# Patient Record
Sex: Female | Born: 1970 | Hispanic: No | Marital: Single | Smoking: Never smoker
Health system: Southern US, Community
[De-identification: ages and names within clinical notes are randomized; demographics above are authoritative.]

## PROBLEM LIST (undated history)

## (undated) DIAGNOSIS — G43909 Migraine, unspecified, not intractable, without status migrainosus: Secondary | ICD-10-CM

## (undated) DIAGNOSIS — J3089 Other allergic rhinitis: Secondary | ICD-10-CM

## (undated) DIAGNOSIS — K219 Gastro-esophageal reflux disease without esophagitis: Secondary | ICD-10-CM

## (undated) DIAGNOSIS — R200 Anesthesia of skin: Secondary | ICD-10-CM

## (undated) DIAGNOSIS — R5383 Other fatigue: Secondary | ICD-10-CM

## (undated) DIAGNOSIS — R42 Dizziness and giddiness: Secondary | ICD-10-CM

## (undated) DIAGNOSIS — Z8619 Personal history of other infectious and parasitic diseases: Secondary | ICD-10-CM

## (undated) DIAGNOSIS — E739 Lactose intolerance, unspecified: Secondary | ICD-10-CM

## (undated) DIAGNOSIS — M1990 Unspecified osteoarthritis, unspecified site: Secondary | ICD-10-CM

## (undated) DIAGNOSIS — Z87442 Personal history of urinary calculi: Secondary | ICD-10-CM

## (undated) DIAGNOSIS — T4145XA Adverse effect of unspecified anesthetic, initial encounter: Secondary | ICD-10-CM

## (undated) DIAGNOSIS — R202 Paresthesia of skin: Secondary | ICD-10-CM

## (undated) DIAGNOSIS — R001 Bradycardia, unspecified: Secondary | ICD-10-CM

## (undated) DIAGNOSIS — F329 Major depressive disorder, single episode, unspecified: Secondary | ICD-10-CM

## (undated) DIAGNOSIS — M069 Rheumatoid arthritis, unspecified: Secondary | ICD-10-CM

## (undated) DIAGNOSIS — M5126 Other intervertebral disc displacement, lumbar region: Secondary | ICD-10-CM

## (undated) DIAGNOSIS — F32A Depression, unspecified: Secondary | ICD-10-CM

## (undated) DIAGNOSIS — K529 Noninfective gastroenteritis and colitis, unspecified: Secondary | ICD-10-CM

## (undated) DIAGNOSIS — J45909 Unspecified asthma, uncomplicated: Secondary | ICD-10-CM

## (undated) DIAGNOSIS — N939 Abnormal uterine and vaginal bleeding, unspecified: Secondary | ICD-10-CM

## (undated) DIAGNOSIS — K589 Irritable bowel syndrome without diarrhea: Secondary | ICD-10-CM

## (undated) DIAGNOSIS — K439 Ventral hernia without obstruction or gangrene: Secondary | ICD-10-CM

## (undated) DIAGNOSIS — D509 Iron deficiency anemia, unspecified: Secondary | ICD-10-CM

## (undated) DIAGNOSIS — E162 Hypoglycemia, unspecified: Secondary | ICD-10-CM

## (undated) DIAGNOSIS — K8020 Calculus of gallbladder without cholecystitis without obstruction: Secondary | ICD-10-CM

## (undated) DIAGNOSIS — M810 Age-related osteoporosis without current pathological fracture: Secondary | ICD-10-CM

## (undated) DIAGNOSIS — IMO0002 Reserved for concepts with insufficient information to code with codable children: Secondary | ICD-10-CM

## (undated) DIAGNOSIS — M329 Systemic lupus erythematosus, unspecified: Secondary | ICD-10-CM

## (undated) DIAGNOSIS — F419 Anxiety disorder, unspecified: Secondary | ICD-10-CM

## (undated) HISTORY — DX: Other intervertebral disc displacement, lumbar region: M51.26

## (undated) HISTORY — PX: TUBAL LIGATION: SHX77

## (undated) HISTORY — DX: Migraine, unspecified, not intractable, without status migrainosus: G43.909

## (undated) HISTORY — DX: Lactose intolerance, unspecified: E73.9

## (undated) HISTORY — DX: Major depressive disorder, single episode, unspecified: F32.9

## (undated) HISTORY — DX: Depression, unspecified: F32.A

## (undated) HISTORY — PX: APPENDECTOMY: SHX54

## (undated) HISTORY — DX: Unspecified asthma, uncomplicated: J45.909

## (undated) HISTORY — PX: UMBILICAL HERNIA REPAIR: SUR1181

## (undated) HISTORY — PX: COLONOSCOPY: SHX174

## (undated) HISTORY — DX: Age-related osteoporosis without current pathological fracture: M81.0

---

## 1992-01-13 DIAGNOSIS — T8859XA Other complications of anesthesia, initial encounter: Secondary | ICD-10-CM

## 1992-01-13 HISTORY — DX: Other complications of anesthesia, initial encounter: T88.59XA

## 2012-02-22 DIAGNOSIS — B029 Zoster without complications: Secondary | ICD-10-CM | POA: Insufficient documentation

## 2012-09-27 ENCOUNTER — Emergency Department (HOSPITAL_COMMUNITY)
Admission: EM | Admit: 2012-09-27 | Discharge: 2012-09-27 | Disposition: A | Discharge status: Home / Self Care | Payer: Medicaid Other | Attending: Emergency Medicine | Admitting: Emergency Medicine

## 2012-09-27 ENCOUNTER — Emergency Department (HOSPITAL_COMMUNITY): Payer: Medicaid Other

## 2012-09-27 DIAGNOSIS — R531 Weakness: Secondary | ICD-10-CM

## 2012-09-27 DIAGNOSIS — Z79899 Other long term (current) drug therapy: Secondary | ICD-10-CM | POA: Insufficient documentation

## 2012-09-27 DIAGNOSIS — R5381 Other malaise: Secondary | ICD-10-CM | POA: Insufficient documentation

## 2012-09-27 DIAGNOSIS — R209 Unspecified disturbances of skin sensation: Secondary | ICD-10-CM | POA: Insufficient documentation

## 2012-09-27 DIAGNOSIS — R42 Dizziness and giddiness: Secondary | ICD-10-CM | POA: Insufficient documentation

## 2012-09-27 DIAGNOSIS — Z3202 Encounter for pregnancy test, result negative: Secondary | ICD-10-CM | POA: Insufficient documentation

## 2012-09-27 LAB — COMPREHENSIVE METABOLIC PANEL
CO2: 24 mEq/L (ref 19–32)
Calcium: 9.1 mg/dL (ref 8.4–10.5)
Creatinine, Ser: 0.8 mg/dL (ref 0.50–1.10)
GFR calc Af Amer: >90 mL/min (ref >90)
GFR calc non Af Amer: 89 mL/min — ABNORMAL LOW (ref >90)
Glucose, Bld: 108 mg/dL — ABNORMAL HIGH (ref 70–99)
Sodium: 139 mEq/L (ref 135–145)
Total Protein: 7.7 g/dL (ref 6.0–8.3)

## 2012-09-27 LAB — CBC WITH DIFFERENTIAL/PLATELET
Basophils Relative: 1 % (ref 0–1)
HCT: 35.1 % — ABNORMAL LOW (ref 36.0–46.0)
Hemoglobin: 11.5 g/dL — ABNORMAL LOW (ref 12.0–15.0)
Lymphocytes Relative: 32 % (ref 12–46)
Lymphs Abs: 2.7 10*3/uL (ref 0.7–4.0)
Monocytes Absolute: 0.7 10*3/uL (ref 0.1–1.0)
Monocytes Relative: 9 % (ref 3–12)
Neutro Abs: 4.6 10*3/uL (ref 1.7–7.7)
Neutrophils Relative %: 56 % (ref 43–77)
RBC: 4.44 MIL/uL (ref 3.87–5.11)
WBC: 8.2 10*3/uL (ref 4.0–10.5)

## 2012-09-27 LAB — URINALYSIS, ROUTINE W REFLEX MICROSCOPIC
Glucose, UA: NEGATIVE mg/dL
Ketones, ur: NEGATIVE mg/dL
Protein, ur: NEGATIVE mg/dL
Urobilinogen, UA: 0.2 mg/dL (ref 0.0–1.0)

## 2012-09-27 LAB — URINE MICROSCOPIC-ADD ON

## 2012-09-27 NOTE — ED Notes (Signed)
Last seen normal 3 days ago.

## 2012-09-27 NOTE — ED Notes (Addendum)
Multiple complaints including hard time swallowing, and numbness in right arm, and right leg. Drift noted in right arm, and right drift.

## 2012-09-27 NOTE — ED Notes (Signed)
Patient C/O pain beginning on her right neck behind her ear and goes down her right leg.  C/o having some right sided numbness. States that this is new. Also C/O having some dizziness.

## 2012-09-27 NOTE — ED Provider Notes (Signed)
Complains of right-sided headache that radiates to her right neck, right shoulder and right flank onset 2 days ago. Also complains of dizziness i.e. symptoms of vertigo Patient also complains of" lumps in my neck" for several days.. Also complains of pain on swallowing for the past 3 days. No fever no other complaint on exam alert nontoxic HEENT exam no facial asymmetry neck supple positive anterior cervical lymphadenopathy, lungs clear equal breath sounds heart regular in rhythm abdomen nondistended nontender back without flank tenderness all 4 extremities all redness or tenderness neurovascularly intact neurologic gait normal Romberg normal pronator drift normal motor strength 5 over 5 overall finger-nose normal her shin normal  Doug Sou, MD 09/27/12 2251

## 2012-09-27 NOTE — ED Notes (Signed)
Pt states she has right sided flank pain that radiates to back and down groin to right leg, and up to right shoulder and neck.

## 2012-09-27 NOTE — ED Provider Notes (Signed)
History     CSN: [id number]  Arrival date & time 09/27/12  1928   First MD Initiated Contact with Patient 09/27/12 2202      Chief Complaint  Patient presents with  . Flank Pain  . Transient Ischemic Attack    (Consider location/radiation/quality/duration/timing/severity/associated sxs/prior treatment) HPI Comments: 42 y.o. female who presents to the ER with a multitude of complaints. Pt states that she has had some dizziness, some blurry vision, and some tingling in her left legs. She also complains of some right sided back pain radiating down her leg. She denies chest pain / sob. She states she has had these types of symptoms prior. She states she was evaluated in [place] for these symptoms -- she states she was thought to have MS -- and "a bunch of tests" were negative. She states she has had a recurrence of these symptoms, but the tingling in her legs is new.  She is new to this area -- states that she is in the process of establishing a pcp -- she has insurance.   Patient is a 42 y.o. female presenting with general illness. The history is provided by the patient.  Illness  The current episode started 3 to 5 days ago. The onset was gradual. The problem occurs rarely. The problem has been resolved. The problem is mild. Nothing relieves the symptoms. Nothing aggravates the symptoms. Pertinent negatives include no fever, no eye itching, no abdominal pain, no diarrhea, no nausea, no vomiting, no headaches, no stridor, no neck pain, no cough, no wheezing, no rash, no eye discharge and no eye pain.    No past medical history on file.  No past surgical history on file.  No family history on file.  History  Substance Use Topics  . Smoking status: Not on file  . Smokeless tobacco: Not on file  . Alcohol Use: Not on file    OB History   No data available      Review of Systems  Constitutional: Negative for fever, chills and fatigue.  HENT: Negative for facial swelling,  drooling, neck pain and dental problem.   Eyes: Negative for pain, discharge and itching.  Respiratory: Negative for cough, choking, wheezing and stridor.   Cardiovascular: Negative for chest pain.  Gastrointestinal: Negative for nausea, vomiting, abdominal pain and diarrhea.  Endocrine: Negative for cold intolerance and heat intolerance.  Genitourinary: Negative for vaginal discharge, difficulty urinating and vaginal pain.  Skin: Negative for pallor and rash.  Neurological: Negative for dizziness, light-headedness and headaches.       Tingling in right leg   Psychiatric/Behavioral: Negative for behavioral problems and agitation.    Allergies  Trazodone and nefazodone  Home Medications   Current Outpatient Rx  Name  Route  Sig  Dispense  Refill  . Cyanocobalamin (VITAMIN B-12 IJ)   Injection   Inject as directed.         Marland Kitchen doxylamine, Sleep, (UNISOM) 25 MG tablet   Oral   Take 50 mg by mouth at bedtime as needed for sleep.         Marland Kitchen ibuprofen (ADVIL,MOTRIN) 600 MG tablet   Oral   Take 600 mg by mouth every 6 (six) hours as needed for pain.         . naproxen (NAPROSYN) 500 MG tablet   Oral   Take 500 mg by mouth 2 (two) times daily with a meal.           BP 136/88  Pulse 90  Temp(Src) 98.9 F (37.2 C) (Oral)  Resp 18  SpO2 99%  LMP 09/26/2012  Physical Exam  Constitutional: She is oriented to person, place, and time. She appears well-developed. No distress.  HENT:  Head: Normocephalic and atraumatic.  Eyes: Pupils are equal, round, and reactive to light. Right eye exhibits no discharge. Left eye exhibits no discharge.  20/20 vision bilaterally. No APD  Neck: Neck supple. No tracheal deviation present.  Cardiovascular: Normal rate.  Exam reveals no gallop and no friction rub.   Pulmonary/Chest: No stridor. No respiratory distress. She has no wheezes.  Abdominal: Soft. She exhibits no distension. There is no tenderness. There is no rebound.   Musculoskeletal: She exhibits no edema and no tenderness.  Neurological: She is alert and oriented to person, place, and time.  Pt is able to easily ambulate in room with normal gait. Romberg negative. No pronator drift. 5/5 strength UE and LE. She does not have paresthesias in her UE or her LE. She has no dermatomal distribution of numbness. She has normal heel to toe, she can close her eyes and walk on her heels and toes without issues. She can jump up and down in room without losing balance.   Skin: Skin is warm. She is not diaphoretic.    ED Course  Procedures (including critical care time)  Labs Reviewed  URINALYSIS, ROUTINE W REFLEX MICROSCOPIC - Abnormal; Notable for the following:    Hgb urine dipstick MODERATE (*)    All other components within normal limits  COMPREHENSIVE METABOLIC PANEL - Abnormal; Notable for the following:    Glucose, Bld 108 (*)    Total Bilirubin 0.2 (*)    GFR calc non Af Amer 89 (*)    All other components within normal limits  CBC WITH DIFFERENTIAL - Abnormal; Notable for the following:    Hemoglobin 11.5 (*)    HCT 35.1 (*)    MCH 25.9 (*)    All other components within normal limits  URINE MICROSCOPIC-ADD ON - Abnormal; Notable for the following:    Squamous Epithelial / LPF FEW (*)    Bacteria, UA FEW (*)    All other components within normal limits  POCT PREGNANCY, URINE   Ct Head Wo Contrast  09/27/2012   *RADIOLOGY REPORT*  Clinical Data: Right arm weakness.  Difficulty swallowing. Numbness in right arm and right leg.  CT HEAD WITHOUT CONTRAST  Technique:  Contiguous axial images were obtained from the base of the skull through the vertex without contrast.  Comparison: None.  Findings: There is no evidence for acute infarction, intracranial hemorrhage, mass lesion, hydrocephalus, or extra-axial fluid. Early cerebellar atrophy is suspected.  No cerebral atrophy.  No significant white matter disease.  No subdural hematoma.  Calvarium intact.  No  vascular calcification.  Sinuses and mastoids clear. Negative orbits.  The  IMPRESSION: Early cerebellar atrophy is suspected.  No acute intracranial findings are identified.   Original Report Authenticated By: Davonna Belling, M.D.    MDM  Nurse indicated pt had pronator drift, however, this is not present on neurological exam. She has no acute neuro deficits on exam. She has a multitude of complaints but no clear definable neuro deficits. Her lab work is unremarkable and her CT scans (all this was ordered prior to pt being evaluated) -- is all negative.   Doubt she has pyelo or nephrolithiatic -- not concistent with labs. Doubt stroke -- CT head negative and no acute deficits. She does have  constellation of neuro symptoms -- pt has insurance, and have told her it is imperative that she f/u with neurology and pcp as soon as possible. She states she will make call son Monday and f/u as soon as possible. Have told her if symptoms return to come back to the Er. At this point, no other testing indicated in the Er -- especially with extensive negative workup that pt reports just 3 months ago and recurrence of prior symptoms.   1. Weakness             Bernadene Person, MD 09/28/12 0865

## 2012-09-28 NOTE — ED Provider Notes (Signed)
I have personally seen and examined the patient.  I have discussed the plan of care with the resident.  I have reviewed the documentation on PMH/FH/Soc. History.  I have reviewed the documentation of the resident and agree.  Maday Guarino, MD 09/28/12 0029 

## 2013-02-25 ENCOUNTER — Emergency Department (HOSPITAL_COMMUNITY): Payer: Medicaid Other

## 2013-02-25 ENCOUNTER — Emergency Department (HOSPITAL_COMMUNITY)
Admission: EM | Admit: 2013-02-25 | Discharge: 2013-02-25 | Disposition: A | Discharge status: Home / Self Care | Payer: Medicaid Other | Attending: Emergency Medicine | Admitting: Emergency Medicine

## 2013-02-25 ENCOUNTER — Encounter (HOSPITAL_COMMUNITY): Payer: Self-pay | Admitting: Emergency Medicine

## 2013-02-25 DIAGNOSIS — R197 Diarrhea, unspecified: Secondary | ICD-10-CM | POA: Insufficient documentation

## 2013-02-25 DIAGNOSIS — R110 Nausea: Secondary | ICD-10-CM | POA: Insufficient documentation

## 2013-02-25 DIAGNOSIS — R059 Cough, unspecified: Secondary | ICD-10-CM | POA: Insufficient documentation

## 2013-02-25 DIAGNOSIS — Z8639 Personal history of other endocrine, nutritional and metabolic disease: Secondary | ICD-10-CM | POA: Insufficient documentation

## 2013-02-25 DIAGNOSIS — Z8659 Personal history of other mental and behavioral disorders: Secondary | ICD-10-CM | POA: Insufficient documentation

## 2013-02-25 DIAGNOSIS — R05 Cough: Secondary | ICD-10-CM | POA: Insufficient documentation

## 2013-02-25 DIAGNOSIS — M129 Arthropathy, unspecified: Secondary | ICD-10-CM | POA: Insufficient documentation

## 2013-02-25 DIAGNOSIS — Z8739 Personal history of other diseases of the musculoskeletal system and connective tissue: Secondary | ICD-10-CM | POA: Insufficient documentation

## 2013-02-25 DIAGNOSIS — Z79899 Other long term (current) drug therapy: Secondary | ICD-10-CM | POA: Insufficient documentation

## 2013-02-25 DIAGNOSIS — R1031 Right lower quadrant pain: Secondary | ICD-10-CM | POA: Insufficient documentation

## 2013-02-25 DIAGNOSIS — R300 Dysuria: Secondary | ICD-10-CM | POA: Insufficient documentation

## 2013-02-25 DIAGNOSIS — Z3202 Encounter for pregnancy test, result negative: Secondary | ICD-10-CM | POA: Insufficient documentation

## 2013-02-25 DIAGNOSIS — Z862 Personal history of diseases of the blood and blood-forming organs and certain disorders involving the immune mechanism: Secondary | ICD-10-CM | POA: Insufficient documentation

## 2013-02-25 DIAGNOSIS — M7918 Myalgia, other site: Secondary | ICD-10-CM

## 2013-02-25 DIAGNOSIS — R109 Unspecified abdominal pain: Secondary | ICD-10-CM

## 2013-02-25 DIAGNOSIS — R350 Frequency of micturition: Secondary | ICD-10-CM | POA: Insufficient documentation

## 2013-02-25 DIAGNOSIS — M79609 Pain in unspecified limb: Secondary | ICD-10-CM | POA: Insufficient documentation

## 2013-02-25 HISTORY — DX: Hypoglycemia, unspecified: E16.2

## 2013-02-25 HISTORY — DX: Anxiety disorder, unspecified: F41.9

## 2013-02-25 HISTORY — DX: Unspecified osteoarthritis, unspecified site: M19.90

## 2013-02-25 HISTORY — DX: Reserved for concepts with insufficient information to code with codable children: IMO0002

## 2013-02-25 HISTORY — DX: Systemic lupus erythematosus, unspecified: M32.9

## 2013-02-25 LAB — CBC WITH DIFFERENTIAL/PLATELET
Basophils Relative: 1 % (ref 0–1)
Eosinophils Absolute: 0.3 10*3/uL (ref 0.0–0.7)
HCT: 34.3 % — ABNORMAL LOW (ref 36.0–46.0)
Hemoglobin: 10.7 g/dL — ABNORMAL LOW (ref 12.0–15.0)
MCH: 22.2 pg — ABNORMAL LOW (ref 26.0–34.0)
MCHC: 31.2 g/dL (ref 30.0–36.0)
Monocytes Absolute: 0.5 10*3/uL (ref 0.1–1.0)
Monocytes Relative: 8 % (ref 3–12)
Neutrophils Relative %: 66 % (ref 43–77)
RDW: 16.3 % — ABNORMAL HIGH (ref 11.5–15.5)

## 2013-02-25 LAB — URINALYSIS, ROUTINE W REFLEX MICROSCOPIC
Bilirubin Urine: NEGATIVE
Ketones, ur: NEGATIVE mg/dL
Nitrite: NEGATIVE
Protein, ur: NEGATIVE mg/dL
pH: 5 (ref 5.0–8.0)

## 2013-02-25 LAB — COMPREHENSIVE METABOLIC PANEL
Albumin: 3.7 g/dL (ref 3.5–5.2)
BUN: 6 mg/dL (ref 6–23)
Creatinine, Ser: 0.75 mg/dL (ref 0.50–1.10)
Total Protein: 7.4 g/dL (ref 6.0–8.3)

## 2013-02-25 LAB — PREGNANCY, URINE: Preg Test, Ur: NEGATIVE

## 2013-02-25 LAB — SEDIMENTATION RATE: Sed Rate: 20 mm/hr (ref 0–22)

## 2013-02-25 MED ORDER — KETOROLAC TROMETHAMINE 60 MG/2ML IM SOLN
60.0000 mg | Freq: Once | INTRAMUSCULAR | Status: AC
  Administered 2013-02-25: 60 mg via INTRAMUSCULAR
  Filled 2013-02-25: qty 2

## 2013-02-25 MED ORDER — NAPROXEN 500 MG PO TABS: 500.0000 mg | ORAL_TABLET | Freq: Two times a day (BID) | ORAL | Status: DC

## 2013-02-25 MED ORDER — CYCLOBENZAPRINE HCL 10 MG PO TABS: 10.0000 mg | ORAL_TABLET | Freq: Three times a day (TID) | ORAL | Status: DC | PRN

## 2013-02-25 NOTE — ED Provider Notes (Signed)
CSN: [id number]     Arrival date & time 02/25/13  0754 History   First MD Initiated Contact with Patient 02/25/13 0809     Chief Complaint  Patient presents with  . Flank Pain    right   (Consider location/radiation/quality/duration/timing/severity/associated sxs/prior Treatment) HPI patient reports she has been having some right-sided flank pain off and on for the past 2-3 weeks however the past 3 days it has been constant. She has a hard time describing it but finally decides it's more of a pressure feeling. She states it hurts when she sits on her right side or when she moves. She also has states it hurts when she urinates and when she coughs or breathes deeply. She states it feels better she sits on her left side. She has had nausea without vomiting, diarrhea, cough, pain or swelling in her legs. She states she also has pressure over her bladder with frequency and see but no incontinence. She denies any hematuria. She states yesterday her pain was an 8/10, today her pain as a 6/10. She's been taking ibuprofen with minimal improvement.  She states she's had some shortness of breath the past 4 days. She denies any recent travel or been on any type of hormones. She denies any change in her activity. She states she's never had this pain before. She states she was diagnosed with kidney stones in 2009 but this pain is different.  Family history is negative for DVT or PE.  Patient states she was diagnosed with lupus when she was having joint pain in [place]. She denies having a rash or any kidney involvement before. She states only 1/5 of her tests were positive for lupus. She has never been on steroids or Plaquenil for  her lupus.  PCP none  Past Medical History  Diagnosis Date  . Anxiety   . Arthritis   . Anemia   . Lupus   . Hypoglycemia    Past Surgical History  Procedure Laterality Date  . Cesarean section    . Appendectomy    . Tubal ligation     No family history on file. History    Substance Use Topics  . Smoking status: Never Smoker   . Smokeless tobacco: Never Used  . Alcohol Use: No  denies street drugs Moved from [place] 4 months ago On disability for anxiety, depression, lupus  OB History   Grav Para Term Preterm Abortions TAB SAB Ect Mult Living                 Review of Systems  All other systems reviewed and are negative.    Allergies  Trazodone and nefazodone  Home Medications   Current Outpatient Rx  Name  Route  Sig  Dispense  Refill  . doxylamine, Sleep, (UNISOM) 25 MG tablet   Oral   Take 50 mg by mouth at bedtime as needed for sleep.         Marland Kitchen ibuprofen (ADVIL,MOTRIN) 600 MG tablet   Oral   Take 600 mg by mouth every 6 (six) hours as needed for pain.         . naproxen (NAPROSYN) 500 MG tablet   Oral   Take 500 mg by mouth 2 (two) times daily with a meal.          BP 126/71  Pulse 87  Temp(Src) 98.5 F (36.9 C) (Oral)  Resp 20  SpO2 98%  LMP 02/04/2013  Vital signs normal   Physical Exam  Nursing note and vitals reviewed. Constitutional: She is oriented to person, place, and time. She appears well-developed and well-nourished.  Non-toxic appearance. She does not appear ill. No distress.  HENT:  Head: Normocephalic and atraumatic.  Right Ear: External ear normal.  Left Ear: External ear normal.  Nose: Nose normal. No mucosal edema or rhinorrhea.  Mouth/Throat: Oropharynx is clear and moist and mucous membranes are normal. No dental abscesses or uvula swelling.  Eyes: Conjunctivae and EOM are normal. Pupils are equal, round, and reactive to light.  Neck: Normal range of motion and full passive range of motion without pain. Neck supple.  Cardiovascular: Normal rate, regular rhythm and normal heart sounds.  Exam reveals no gallop and no friction rub.   No murmur heard. Pulmonary/Chest: Effort normal and breath sounds normal. No respiratory distress. She has no wheezes. She has no rhonchi. She has no rales. She exhibits  no tenderness and no crepitus.  Abdominal: Soft. Normal appearance and bowel sounds are normal. She exhibits no distension. There is no tenderness. There is no rebound and no guarding.  Patient has mild right lower quadrant tenderness without guarding or rebound  Genitourinary:  Patient has right CVA tenderness  Musculoskeletal: Normal range of motion. She exhibits no edema and no tenderness.       Back:  Has pain in her right flank with range of motion of her waist to the right and forward flexion. She does not have pain on flexion to the left.  No obvious swelling of her joints or redness or inflammation noted  Neurological: She is alert and oriented to person, place, and time. She has normal strength. No cranial nerve deficit.  Skin: Skin is warm, dry and intact. No rash noted. No erythema. No pallor.  Psychiatric: She has a normal mood and affect. Her speech is normal and behavior is normal. Her mood appears not anxious.    ED Course  Procedures (including critical care time)  Medications  ketorolac (TORADOL) injection 60 mg (60 mg Intramuscular Given 02/25/13 0913)    PT given results of her tests. She states her SED rate used to be really high. Pt requesting referral to rheumatologist and states she was evaluated in [place] for dizziness and had a MRI and was told she didn't have MS, but was told to f/u with a neurologist which she didn't do because she moved to [place].   Patient states she has heavy periods and she was advised she was mildly anemic with iron deficiency appearing indices. She was advised to take over-the-counter iron pills.  Labs Review Results for orders placed during the hospital encounter of 02/25/13  URINALYSIS, ROUTINE W REFLEX MICROSCOPIC      Result Value Range   Color, Urine YELLOW  YELLOW   APPearance CLOUDY (*) CLEAR   Specific Gravity, Urine 1.027  1.005 - 1.030   pH 5.0  5.0 - 8.0   Glucose, UA NEGATIVE  NEGATIVE mg/dL   Hgb urine dipstick NEGATIVE   NEGATIVE   Bilirubin Urine NEGATIVE  NEGATIVE   Ketones, ur NEGATIVE  NEGATIVE mg/dL   Protein, ur NEGATIVE  NEGATIVE mg/dL   Urobilinogen, UA 0.2  0.0 - 1.0 mg/dL   Nitrite NEGATIVE  NEGATIVE   Leukocytes, UA NEGATIVE  NEGATIVE  CBC WITH DIFFERENTIAL      Result Value Range   WBC 6.8  4.0 - 10.5 K/uL   RBC 4.83  3.87 - 5.11 MIL/uL   Hemoglobin 10.7 (*) 12.0 - 15.0 g/dL  HCT 34.3 (*) 36.0 - 46.0 %   MCV 71.0 (*) 78.0 - 100.0 fL   MCH 22.2 (*) 26.0 - 34.0 pg   MCHC 31.2  30.0 - 36.0 g/dL   RDW 16.1 (*) 09.6 - 04.5 %   Platelets 308  150 - 400 K/uL   Neutrophils Relative % 66  43 - 77 %   Neutro Abs 4.4  1.7 - 7.7 K/uL   Lymphocytes Relative 21  12 - 46 %   Lymphs Abs 1.4  0.7 - 4.0 K/uL   Monocytes Relative 8  3 - 12 %   Monocytes Absolute 0.5  0.1 - 1.0 K/uL   Eosinophils Relative 4  0 - 5 %   Eosinophils Absolute 0.3  0.0 - 0.7 K/uL   Basophils Relative 1  0 - 1 %   Basophils Absolute 0.1  0.0 - 0.1 K/uL  COMPREHENSIVE METABOLIC PANEL      Result Value Range   Sodium 135  135 - 145 mEq/L   Potassium 3.7  3.5 - 5.1 mEq/L   Chloride 103  96 - 112 mEq/L   CO2 21  19 - 32 mEq/L   Glucose, Bld 85  70 - 99 mg/dL   BUN 6  6 - 23 mg/dL   Creatinine, Ser 4.09  0.50 - 1.10 mg/dL   Calcium 8.9  8.4 - 81.1 mg/dL   Total Protein 7.4  6.0 - 8.3 g/dL   Albumin 3.7  3.5 - 5.2 g/dL   AST 12  0 - 37 U/L   ALT 8  0 - 35 U/L   Alkaline Phosphatase 75  39 - 117 U/L   Total Bilirubin 0.2 (*) 0.3 - 1.2 mg/dL   GFR calc non Af Amer >90  >90 mL/min   GFR calc Af Amer >90  >90 mL/min  SEDIMENTATION RATE      Result Value Range   Sed Rate 20  0 - 22 mm/hr  PREGNANCY, URINE      Result Value Range   Preg Test, Ur NEGATIVE  NEGATIVE   Laboratory interpretation all normal    Imaging Review Ct Abdomen Pelvis Wo Contrast  02/25/2013   CLINICAL DATA:  Right flank pain for 3 days. Painful urination and increased frequency.  EXAM: CT ABDOMEN AND PELVIS WITHOUT CONTRAST  TECHNIQUE:  Multidetector CT imaging of the abdomen and pelvis was performed following the standard protocol without intravenous contrast.  COMPARISON:  None.  FINDINGS: Lung bases show no acute findings. Heart size normal. No pericardial or pleural effusion.  Liver, gallbladder, adrenal glands, kidneys, spleen, pancreas, stomach and bowel are unremarkable. Uterus and ovaries are visualized. No pathologically enlarged lymph nodes. No free fluid. Bladder is unremarkable. No worrisome lytic or sclerotic lesions.  IMPRESSION: Negative. No findings to explain the patient's given symptoms.   Electronically Signed   By: Leanna Battles M.D.   On: 02/25/2013 10:24      MDM  patient presents with right flank that sounds musculoskeletal with no other obvious etiology found on her testing today. She is felt medically cleared for discharge.    1. Right flank pain   2. Musculoskeletal pain     New Prescriptions   CYCLOBENZAPRINE (FLEXERIL) 10 MG TABLET    Take 1 tablet (10 mg total) by mouth 3 (three) times daily as needed (muscle pain).   NAPROXEN (NAPROSYN) 500 MG TABLET    Take 1 tablet (500 mg total) by mouth 2 (two) times daily.  Plan discharge   Devoria Albe, MD, FACEP `   Ward Givens, MD 02/25/13 1051

## 2013-02-25 NOTE — Progress Notes (Signed)
    CARE MANAGEMENT ED NOTE 02/25/2013  Patient:  [facility] A   Account Number:  [id number]  Date Initiated:  02/25/2013  Documentation initiated by:  Edd Arbour  Subjective/Objective Assessment:   42 yr old female [place] access without a pcp listed in EPIC     Subjective/Objective Assessment Detail:     Action/Plan:   Spoke with pt who states she does not have a pcp Cm provided her with a list of [place] pcps   Action/Plan Detail:   Anticipated DC Date:  02/25/2013     Status Recommendation to Physician:   Result of Recommendation:    Other ED Services  Consult Working Plan    DC Planning Services  Other  PCP issues  Outpatient Services - Pt will follow up    Choice offered to / List presented to:            Status of service:  Completed, signed off  ED Comments:   ED Comments Detail:

## 2013-02-25 NOTE — ED Notes (Signed)
Pt c/o right flank pain that started three days ago. Pt states she has painful urination and increased frequency. Pt denies blood in urine.

## 2013-03-10 ENCOUNTER — Emergency Department (HOSPITAL_COMMUNITY)
Admission: EM | Admit: 2013-03-10 | Discharge: 2013-03-10 | Disposition: A | Discharge status: Home / Self Care | Payer: Medicaid Other | Attending: Emergency Medicine | Admitting: Emergency Medicine

## 2013-03-10 ENCOUNTER — Encounter (HOSPITAL_COMMUNITY): Payer: Self-pay | Admitting: Emergency Medicine

## 2013-03-10 DIAGNOSIS — K921 Melena: Secondary | ICD-10-CM | POA: Insufficient documentation

## 2013-03-10 DIAGNOSIS — Z3202 Encounter for pregnancy test, result negative: Secondary | ICD-10-CM | POA: Insufficient documentation

## 2013-03-10 DIAGNOSIS — R109 Unspecified abdominal pain: Secondary | ICD-10-CM

## 2013-03-10 DIAGNOSIS — Z791 Long term (current) use of non-steroidal anti-inflammatories (NSAID): Secondary | ICD-10-CM | POA: Insufficient documentation

## 2013-03-10 DIAGNOSIS — Z8639 Personal history of other endocrine, nutritional and metabolic disease: Secondary | ICD-10-CM | POA: Insufficient documentation

## 2013-03-10 DIAGNOSIS — M129 Arthropathy, unspecified: Secondary | ICD-10-CM | POA: Insufficient documentation

## 2013-03-10 DIAGNOSIS — Z862 Personal history of diseases of the blood and blood-forming organs and certain disorders involving the immune mechanism: Secondary | ICD-10-CM | POA: Insufficient documentation

## 2013-03-10 DIAGNOSIS — R1011 Right upper quadrant pain: Secondary | ICD-10-CM | POA: Insufficient documentation

## 2013-03-10 DIAGNOSIS — Z9089 Acquired absence of other organs: Secondary | ICD-10-CM | POA: Insufficient documentation

## 2013-03-10 DIAGNOSIS — Z9851 Tubal ligation status: Secondary | ICD-10-CM | POA: Insufficient documentation

## 2013-03-10 DIAGNOSIS — Z8659 Personal history of other mental and behavioral disorders: Secondary | ICD-10-CM | POA: Insufficient documentation

## 2013-03-10 LAB — URINALYSIS, ROUTINE W REFLEX MICROSCOPIC
Glucose, UA: NEGATIVE mg/dL
Ketones, ur: NEGATIVE mg/dL
Leukocytes, UA: NEGATIVE
Protein, ur: NEGATIVE mg/dL

## 2013-03-10 LAB — CBC WITH DIFFERENTIAL/PLATELET
Basophils Relative: 1 % (ref 0–1)
Hemoglobin: 10.2 g/dL — ABNORMAL LOW (ref 12.0–15.0)
Lymphs Abs: 1.8 10*3/uL (ref 0.7–4.0)
Monocytes Relative: 12 % (ref 3–12)
Neutro Abs: 4.2 10*3/uL (ref 1.7–7.7)
Neutrophils Relative %: 55 % (ref 43–77)
RBC: 4.57 MIL/uL (ref 3.87–5.11)
WBC: 7.7 10*3/uL (ref 4.0–10.5)

## 2013-03-10 LAB — COMPREHENSIVE METABOLIC PANEL
Albumin: 3.8 g/dL (ref 3.5–5.2)
Alkaline Phosphatase: 85 U/L (ref 39–117)
BUN: 6 mg/dL (ref 6–23)
Chloride: 104 mEq/L (ref 96–112)
Glucose, Bld: 76 mg/dL (ref 70–99)
Potassium: 3.5 mEq/L (ref 3.5–5.1)
Total Bilirubin: 0.2 mg/dL — ABNORMAL LOW (ref 0.3–1.2)

## 2013-03-10 LAB — POCT PREGNANCY, URINE: Preg Test, Ur: NEGATIVE

## 2013-03-10 MED ORDER — SUCRALFATE 1 G PO TABS: 1.0000 g | ORAL_TABLET | Freq: Four times a day (QID) | ORAL | Status: DC

## 2013-03-10 NOTE — ED Provider Notes (Signed)
CSN: [id number]     Arrival date & time 03/10/13  1101 History   First MD Initiated Contact with Patient 03/10/13 1119     Chief Complaint  Patient presents with  . Rectal Bleeding   (Consider location/radiation/quality/duration/timing/severity/associated sxs/prior Treatment) Patient is a 42 y.o. female presenting with hematochezia. The history is provided by the patient. No language interpreter was used.  Rectal Bleeding Quality:  Bright red Associated symptoms: abdominal pain   Associated symptoms: no fever and no vomiting   Associated symptoms comment:  Abdominal cramping that occurs when she attempts to eat or drink anything. Cramping affects the RUQ, and then she has the urge to have a bowel movement. She reports most of the time she passes gas with some mucus. She has been seeing bright red blood in her normal colored stool for several days. Last bowel movement this morning. No fever, N, V. She was seen in the ED for same on 02/25/13 and reports negative labs and CT of her abdomen. She returns because symptoms persist.    Past Medical History  Diagnosis Date  . Anxiety   . Arthritis   . Anemia   . Lupus   . Hypoglycemia    Past Surgical History  Procedure Laterality Date  . Cesarean section    . Appendectomy    . Tubal ligation     History reviewed. No pertinent family history. History  Substance Use Topics  . Smoking status: Never Smoker   . Smokeless tobacco: Never Used  . Alcohol Use: No   OB History   Grav Para Term Preterm Abortions TAB SAB Ect Mult Living                 Review of Systems  Constitutional: Negative for fever and chills.  HENT: Negative.   Respiratory: Negative.  Negative for shortness of breath.   Cardiovascular: Negative.  Negative for chest pain.  Gastrointestinal: Positive for abdominal pain, blood in stool and hematochezia. Negative for vomiting and rectal pain.  Genitourinary: Negative.  Negative for dysuria.  Musculoskeletal:  Negative.   Skin: Negative.   Neurological: Negative.     Allergies  Trazodone and nefazodone  Home Medications   Current Outpatient Rx  Name  Route  Sig  Dispense  Refill  . doxylamine, Sleep, (UNISOM) 25 MG tablet   Oral   Take 50 mg by mouth at bedtime as needed for sleep.         Marland Kitchen ibuprofen (ADVIL,MOTRIN) 600 MG tablet   Oral   Take 600 mg by mouth every 6 (six) hours as needed for pain.         . naproxen (NAPROSYN) 500 MG tablet   Oral   Take 500 mg by mouth 2 (two) times daily with a meal.          BP 121/78  Pulse 83  Temp(Src) 98.3 F (36.8 C) (Oral)  SpO2 100%  LMP 02/04/2013 Physical Exam  Constitutional: She is oriented to person, place, and time. She appears well-developed and well-nourished.  HENT:  Head: Normocephalic.  Neck: Normal range of motion. Neck supple.  Cardiovascular: Normal rate and regular rhythm.   Pulmonary/Chest: Effort normal and breath sounds normal.  Abdominal: Soft. Bowel sounds are normal. There is tenderness. There is no rebound and no guarding.  RUQ tenderness without guarding or rebound. Soft abdomen. No organomegaly.  Genitourinary:  No blood at rectum. No visualized hemorrhoids or fissure.  Musculoskeletal: Normal range of motion.  Neurological: She  is alert and oriented to person, place, and time.  Skin: Skin is warm and dry. No rash noted.  Psychiatric: She has a normal mood and affect.    ED Course  Procedures (including critical care time) Labs Review Labs Reviewed  CBC WITH DIFFERENTIAL  COMPREHENSIVE METABOLIC PANEL  URINALYSIS, ROUTINE W REFLEX MICROSCOPIC  POCT PREGNANCY, URINE   Results for orders placed during the hospital encounter of 03/10/13  CBC WITH DIFFERENTIAL      Result Value Range   WBC 7.7  4.0 - 10.5 K/uL   RBC 4.57  3.87 - 5.11 MIL/uL   Hemoglobin 10.2 (*) 12.0 - 15.0 g/dL   HCT 95.6 (*) 21.3 - 08.6 %   MCV 70.5 (*) 78.0 - 100.0 fL   MCH 22.3 (*) 26.0 - 34.0 pg   MCHC 31.7  30.0 -  36.0 g/dL   RDW 57.8 (*) 46.9 - 62.9 %   Platelets 332  150 - 400 K/uL   Neutrophils Relative % 55  43 - 77 %   Neutro Abs 4.2  1.7 - 7.7 K/uL   Lymphocytes Relative 23  12 - 46 %   Lymphs Abs 1.8  0.7 - 4.0 K/uL   Monocytes Relative 12  3 - 12 %   Monocytes Absolute 0.9  0.1 - 1.0 K/uL   Eosinophils Relative 10 (*) 0 - 5 %   Eosinophils Absolute 0.7  0.0 - 0.7 K/uL   Basophils Relative 1  0 - 1 %   Basophils Absolute 0.1  0.0 - 0.1 K/uL  COMPREHENSIVE METABOLIC PANEL      Result Value Range   Sodium 137  135 - 145 mEq/L   Potassium 3.5  3.5 - 5.1 mEq/L   Chloride 104  96 - 112 mEq/L   CO2 23  19 - 32 mEq/L   Glucose, Bld 76  70 - 99 mg/dL   BUN 6  6 - 23 mg/dL   Creatinine, Ser 5.28  0.50 - 1.10 mg/dL   Calcium 9.4  8.4 - 41.3 mg/dL   Total Protein 7.6  6.0 - 8.3 g/dL   Albumin 3.8  3.5 - 5.2 g/dL   AST 15  0 - 37 U/L   ALT 9  0 - 35 U/L   Alkaline Phosphatase 85  39 - 117 U/L   Total Bilirubin 0.2 (*) 0.3 - 1.2 mg/dL   GFR calc non Af Amer >90  >90 mL/min   GFR calc Af Amer >90  >90 mL/min  URINALYSIS, ROUTINE W REFLEX MICROSCOPIC      Result Value Range   Color, Urine YELLOW  YELLOW   APPearance CLOUDY (*) CLEAR   Specific Gravity, Urine 1.012  1.005 - 1.030   pH 6.0  5.0 - 8.0   Glucose, UA NEGATIVE  NEGATIVE mg/dL   Hgb urine dipstick NEGATIVE  NEGATIVE   Bilirubin Urine NEGATIVE  NEGATIVE   Ketones, ur NEGATIVE  NEGATIVE mg/dL   Protein, ur NEGATIVE  NEGATIVE mg/dL   Urobilinogen, UA 0.2  0.0 - 1.0 mg/dL   Nitrite NEGATIVE  NEGATIVE   Leukocytes, UA NEGATIVE  NEGATIVE  POCT PREGNANCY, URINE      Result Value Range   Preg Test, Ur NEGATIVE  NEGATIVE    Imaging Review No results found.  EKG Interpretation   None       MDM  No diagnosis found. 1. Abdominal pain  RUQ tenderness with normal lab studies, including LFTs, and a CT on  02/25/13 with normal gall bladder when having same symptoms. No N, V, fever. With mucus in stools and guaiac negative today,  feel she is stable for discharge home with follow up with GI.     Arnoldo Hooker, PA-C 03/10/13 1412

## 2013-03-10 NOTE — Progress Notes (Signed)
   CARE MANAGEMENT ED NOTE 03/10/2013  Patient:  [facility]   Account Number:  [id number]  Date Initiated:  03/10/2013  Documentation initiated by:  Edd Arbour  Subjective/Objective Assessment:   42 yr old female without a pcp listed with medicaid [place] access coverage Pt states [facility] listed on her medicaid card as pcp pt agreed to a list of [place] [employer]     Subjective/Objective Assessment Detail:     Action/Plan:   Action/Plan Detail:   spoke with pt and provided a list of medicaid providers and updated EPIC   Anticipated DC Date:  03/10/2013     Status Recommendation to Physician:   Result of Recommendation:    Other ED Services  Consult Working Plan    DC Planning Services  Other  PCP issues  Outpatient Services - Pt will follow up    Choice offered to / List presented to:            Status of service:  Completed, signed off  ED Comments:   ED Comments Detail:

## 2013-03-10 NOTE — ED Provider Notes (Signed)
Medical screening examination/treatment/procedure(s) were performed by non-physician practitioner and as supervising physician I was immediately available for consultation/collaboration.  EKG Interpretation   None        Cydney Alvarenga, MD 03/10/13 1705 

## 2013-03-10 NOTE — ED Notes (Signed)
Bed: WA09 Expected date:  Expected time:  Means of arrival:  Comments: 42 y/o neck pain

## 2013-03-10 NOTE — ED Notes (Signed)
Pt c/o rectal bleeding since x3 days.  Reports the first 2 days there was a large amount of bright red blood, now she only sees a scant amount in her stool and when she wipes. Pt reports being seen here last week due to abd cramping.  Current ruq abd pain 4/10.  Reports pain worsened after eating.  Pt has hx of internal hemrroids and anemia.

## 2013-05-06 ENCOUNTER — Encounter (HOSPITAL_COMMUNITY): Payer: Self-pay | Admitting: Emergency Medicine

## 2013-05-06 ENCOUNTER — Emergency Department (HOSPITAL_COMMUNITY)
Admission: EM | Admit: 2013-05-06 | Discharge: 2013-05-06 | Disposition: A | Discharge status: Home / Self Care | Payer: Medicaid Other | Attending: Emergency Medicine | Admitting: Emergency Medicine

## 2013-05-06 DIAGNOSIS — Z8739 Personal history of other diseases of the musculoskeletal system and connective tissue: Secondary | ICD-10-CM | POA: Insufficient documentation

## 2013-05-06 DIAGNOSIS — J309 Allergic rhinitis, unspecified: Secondary | ICD-10-CM

## 2013-05-06 DIAGNOSIS — R05 Cough: Secondary | ICD-10-CM | POA: Insufficient documentation

## 2013-05-06 DIAGNOSIS — R059 Cough, unspecified: Secondary | ICD-10-CM | POA: Insufficient documentation

## 2013-05-06 DIAGNOSIS — Z8639 Personal history of other endocrine, nutritional and metabolic disease: Secondary | ICD-10-CM | POA: Insufficient documentation

## 2013-05-06 DIAGNOSIS — B349 Viral infection, unspecified: Secondary | ICD-10-CM

## 2013-05-06 DIAGNOSIS — Z862 Personal history of diseases of the blood and blood-forming organs and certain disorders involving the immune mechanism: Secondary | ICD-10-CM | POA: Insufficient documentation

## 2013-05-06 DIAGNOSIS — F411 Generalized anxiety disorder: Secondary | ICD-10-CM | POA: Insufficient documentation

## 2013-05-06 DIAGNOSIS — R6883 Chills (without fever): Secondary | ICD-10-CM | POA: Insufficient documentation

## 2013-05-06 DIAGNOSIS — Z791 Long term (current) use of non-steroidal anti-inflammatories (NSAID): Secondary | ICD-10-CM | POA: Insufficient documentation

## 2013-05-06 DIAGNOSIS — M129 Arthropathy, unspecified: Secondary | ICD-10-CM | POA: Insufficient documentation

## 2013-05-06 DIAGNOSIS — H9209 Otalgia, unspecified ear: Secondary | ICD-10-CM | POA: Insufficient documentation

## 2013-05-06 DIAGNOSIS — B9789 Other viral agents as the cause of diseases classified elsewhere: Secondary | ICD-10-CM | POA: Insufficient documentation

## 2013-05-06 DIAGNOSIS — R42 Dizziness and giddiness: Secondary | ICD-10-CM | POA: Insufficient documentation

## 2013-05-06 MED ORDER — LORATADINE 10 MG PO TABS: 10.0000 mg | ORAL_TABLET | Freq: Every day | ORAL | Status: DC

## 2013-05-06 MED ORDER — GUAIFENESIN 100 MG/5ML PO SOLN: 5.0000 mL | ORAL | Status: DC | PRN

## 2013-05-06 MED ORDER — PSEUDOEPHEDRINE HCL ER 120 MG PO TB12: 120.0000 mg | ORAL_TABLET | Freq: Two times a day (BID) | ORAL | Status: DC

## 2013-05-06 MED ORDER — FLUTICASONE PROPIONATE 50 MCG/ACT NA SUSP: 2.0000 | Freq: Every day | NASAL | Status: DC

## 2013-05-06 NOTE — ED Notes (Signed)
Pt arrived to ED with a complaint of shortness of breath.  Pt states she has had these symptoms for a couple of days.  Pt states her coughing has caused her to become short of breath and thus has been unable to sleep. Pt state she has no dx of asthma, but does have seasonal allergy issues.

## 2013-05-06 NOTE — ED Provider Notes (Signed)
Medical screening examination/treatment/procedure(s) were performed by non-physician practitioner and as supervising physician I was immediately available for consultation/collaboration.  EKG Interpretation   None        Raudel Bazen T Nieves Barberi, MD 05/06/13 2350 

## 2013-05-06 NOTE — ED Provider Notes (Signed)
CSN: [id number]     Arrival date & time 05/06/13  1840 History  This chart was scribed for non-physician practitioner, Junious Silk, PA-C,working with Toy Baker, MD, by Karle Plumber, ED Scribe.  This patient was seen in room WTR9/WTR9 and the patient's care was started at 8:13 PM.  Chief Complaint  Patient presents with  . Shortness of Breath   HPI HPI Comments:  Danielle Holden is a 42 y.o. female who presents to the Emergency Department complaining of a non-productive cough and secondary SOB for two days. Pt states she has associated sinus pain and pressure, dizziness, chills, right ear pain and right-sided pain when she coughs. She attributes the dizziness to her congestion and reports she has had this in the past. Pt states she has taken Zyrtec and Ibuprofen with no relief. Last dose of ibuprofen was >7 hours ago. She states she has used an inhaler in the past but no longer has it. She denies nausea, vomiting, or body aches. She denies h/o COPD or asthma.    Past Medical History  Diagnosis Date  . Anxiety   . Arthritis   . Anemia   . Lupus   . Hypoglycemia    Past Surgical History  Procedure Laterality Date  . Cesarean section    . Appendectomy    . Tubal ligation     History reviewed. No pertinent family history. History  Substance Use Topics  . Smoking status: Never Smoker   . Smokeless tobacco: Never Used  . Alcohol Use: No   OB History   Grav Para Term Preterm Abortions TAB SAB Ect Mult Living                 Review of Systems  Constitutional: Positive for chills.  HENT: Positive for ear pain (right ear).   Respiratory: Positive for cough and shortness of breath (secondary to cough).   Neurological: Positive for dizziness.  All other systems reviewed and are negative.    Allergies  Trazodone and nefazodone  Home Medications   Current Outpatient Rx  Name  Route  Sig  Dispense  Refill  . doxylamine, Sleep, (UNISOM) 25 MG tablet   Oral   Take  50 mg by mouth at bedtime as needed for sleep.         Marland Kitchen ibuprofen (ADVIL,MOTRIN) 600 MG tablet   Oral   Take 600 mg by mouth every 6 (six) hours as needed for pain.         . naproxen (NAPROSYN) 500 MG tablet   Oral   Take 500 mg by mouth 2 (two) times daily with a meal.          Triage Vitals: BP 131/84  Pulse 88  Temp(Src) 98.6 F (37 C) (Oral)  Resp 18  SpO2 100%  LMP 05/02/2013 Physical Exam  Nursing note and vitals reviewed. Constitutional: She is oriented to person, place, and time. She appears well-developed and well-nourished. No distress.  HENT:  Head: Normocephalic and atraumatic.  Right Ear: Hearing, tympanic membrane, external ear and ear canal normal.  Left Ear: Hearing, tympanic membrane, external ear and ear canal normal.  Nose: Nose normal.  Mouth/Throat: Uvula is midline, oropharynx is clear and moist and mucous membranes are normal. No trismus in the jaw.  Pale nasal mucosa.  Eyes: Conjunctivae are normal.  Neck: Normal range of motion.  Cardiovascular: Normal rate, regular rhythm and normal heart sounds.   Pulmonary/Chest: Effort normal and breath sounds normal. No stridor.  No respiratory distress. She has no wheezes. She has no rales.  Tender to palpation over right anterior ribs.  Abdominal: Soft. She exhibits no distension.  Musculoskeletal: Normal range of motion.  Neurological: She is alert and oriented to person, place, and time. She has normal strength.  Skin: Skin is warm and dry. She is not diaphoretic. No erythema.  Psychiatric: She has a normal mood and affect. Her behavior is normal.    ED Course  Procedures (including critical care time) DIAGNOSTIC STUDIES: Oxygen Saturation is 100% on RA, normal by my interpretation.   COORDINATION OF CARE: 8:18 PM- Will prescribe Flonase, allergy medication, and a decongestant. Pt verbalizes understanding and agrees to plan.  Medications - No data to display  Labs Review Labs Reviewed - No  data to display Imaging Review No results found.  EKG Interpretation   None       MDM   1. Allergic rhinitis   2. Viral syndrome    Patients symptoms are consistent with URI, likely viral etiology. Patient's oxygen saturations remain at 100% during ED stay. Discussed that antibiotics are not indicated for viral infections. Pt will be discharged with symptomatic treatment.  Verbalizes understanding and is agreeable with plan. Pt is hemodynamically stable & in NAD prior to dc.   I personally performed the services described in this documentation, which was scribed in my presence. The recorded information has been reviewed and is accurate.    Mora Bellman, PA-C 05/06/13 2045

## 2013-06-04 DIAGNOSIS — M2550 Pain in unspecified joint: Secondary | ICD-10-CM | POA: Insufficient documentation

## 2013-06-19 ENCOUNTER — Encounter: Payer: Self-pay | Admitting: Obstetrics

## 2013-06-27 ENCOUNTER — Encounter (HOSPITAL_COMMUNITY): Payer: Self-pay | Admitting: Emergency Medicine

## 2013-06-27 ENCOUNTER — Observation Stay (HOSPITAL_COMMUNITY)
Admission: EM | Admit: 2013-06-27 | Discharge: 2013-06-30 | Disposition: A | Discharge status: Home / Self Care | Payer: Medicaid Other | Attending: Internal Medicine | Admitting: Internal Medicine

## 2013-06-27 ENCOUNTER — Emergency Department (HOSPITAL_COMMUNITY): Payer: Medicaid Other

## 2013-06-27 DIAGNOSIS — N920 Excessive and frequent menstruation with regular cycle: Secondary | ICD-10-CM | POA: Diagnosis present

## 2013-06-27 DIAGNOSIS — R51 Headache: Secondary | ICD-10-CM | POA: Insufficient documentation

## 2013-06-27 DIAGNOSIS — R319 Hematuria, unspecified: Secondary | ICD-10-CM

## 2013-06-27 DIAGNOSIS — D509 Iron deficiency anemia, unspecified: Secondary | ICD-10-CM | POA: Diagnosis present

## 2013-06-27 DIAGNOSIS — R42 Dizziness and giddiness: Secondary | ICD-10-CM | POA: Insufficient documentation

## 2013-06-27 DIAGNOSIS — R209 Unspecified disturbances of skin sensation: Secondary | ICD-10-CM | POA: Insufficient documentation

## 2013-06-27 DIAGNOSIS — G459 Transient cerebral ischemic attack, unspecified: Secondary | ICD-10-CM | POA: Diagnosis present

## 2013-06-27 DIAGNOSIS — R079 Chest pain, unspecified: Secondary | ICD-10-CM

## 2013-06-27 DIAGNOSIS — Z79899 Other long term (current) drug therapy: Secondary | ICD-10-CM | POA: Insufficient documentation

## 2013-06-27 DIAGNOSIS — R531 Weakness: Secondary | ICD-10-CM | POA: Diagnosis present

## 2013-06-27 DIAGNOSIS — R0789 Other chest pain: Principal | ICD-10-CM | POA: Insufficient documentation

## 2013-06-27 DIAGNOSIS — I498 Other specified cardiac arrhythmias: Secondary | ICD-10-CM | POA: Insufficient documentation

## 2013-06-27 DIAGNOSIS — R29898 Other symptoms and signs involving the musculoskeletal system: Secondary | ICD-10-CM | POA: Insufficient documentation

## 2013-06-27 DIAGNOSIS — R0602 Shortness of breath: Secondary | ICD-10-CM | POA: Insufficient documentation

## 2013-06-27 DIAGNOSIS — D649 Anemia, unspecified: Secondary | ICD-10-CM | POA: Diagnosis present

## 2013-06-27 DIAGNOSIS — M6281 Muscle weakness (generalized): Secondary | ICD-10-CM

## 2013-06-27 HISTORY — DX: Chest pain, unspecified: R07.9

## 2013-06-27 LAB — CBC WITH DIFFERENTIAL/PLATELET
BASOS ABS: 0 10*3/uL (ref 0.0–0.1)
Basophils Relative: 0 % (ref 0–1)
Eosinophils Absolute: 0.2 10*3/uL (ref 0.0–0.7)
Eosinophils Relative: 2 % (ref 0–5)
HEMATOCRIT: 29.4 % — AB (ref 36.0–46.0)
Hemoglobin: 8.9 g/dL — ABNORMAL LOW (ref 12.0–15.0)
Lymphocytes Relative: 27 % (ref 12–46)
Lymphs Abs: 2.4 10*3/uL (ref 0.7–4.0)
MCH: 20 pg — ABNORMAL LOW (ref 26.0–34.0)
MCHC: 30.3 g/dL (ref 30.0–36.0)
MCV: 66.1 fL — ABNORMAL LOW (ref 78.0–100.0)
MONOS PCT: 8 % (ref 3–12)
Monocytes Absolute: 0.7 10*3/uL (ref 0.1–1.0)
Neutro Abs: 5.6 10*3/uL (ref 1.7–7.7)
Neutrophils Relative %: 63 % (ref 43–77)
Platelets: 359 10*3/uL (ref 150–400)
RBC: 4.45 MIL/uL (ref 3.87–5.11)
RDW: 17.8 % — AB (ref 11.5–15.5)
WBC: 8.9 10*3/uL (ref 4.0–10.5)

## 2013-06-27 LAB — URINE MICROSCOPIC-ADD ON

## 2013-06-27 LAB — URINALYSIS, ROUTINE W REFLEX MICROSCOPIC
Bilirubin Urine: NEGATIVE
Glucose, UA: NEGATIVE mg/dL
KETONES UR: NEGATIVE mg/dL
Nitrite: NEGATIVE
Protein, ur: 30 mg/dL — AB
Specific Gravity, Urine: 1.027 (ref 1.005–1.030)
UROBILINOGEN UA: 0.2 mg/dL (ref 0.0–1.0)
pH: 6 (ref 5.0–8.0)

## 2013-06-27 LAB — COMPREHENSIVE METABOLIC PANEL
ALT: 8 U/L (ref 0–35)
AST: 12 U/L (ref 0–37)
Albumin: 3.7 g/dL (ref 3.5–5.2)
Alkaline Phosphatase: 76 U/L (ref 39–117)
BUN: 8 mg/dL (ref 6–23)
CO2: 22 mEq/L (ref 19–32)
Calcium: 8.6 mg/dL (ref 8.4–10.5)
Chloride: 101 mEq/L (ref 96–112)
Creatinine, Ser: 0.61 mg/dL (ref 0.50–1.10)
GFR calc Af Amer: >90 mL/min (ref >90)
GFR calc non Af Amer: >90 mL/min (ref >90)
GLUCOSE: 74 mg/dL (ref 70–99)
POTASSIUM: 3.6 meq/L — AB (ref 3.7–5.3)
Sodium: 137 mEq/L (ref 137–147)
TOTAL PROTEIN: 7.4 g/dL (ref 6.0–8.3)

## 2013-06-27 LAB — LIPASE, BLOOD: Lipase: 34 U/L (ref 11–59)

## 2013-06-27 LAB — TROPONIN I
Troponin I: <0.3 ng/mL (ref <0.30)
Troponin I: <0.3 ng/mL (ref <0.30)

## 2013-06-27 LAB — PRO B NATRIURETIC PEPTIDE: PRO B NATRI PEPTIDE: 90.9 pg/mL (ref 0–125)

## 2013-06-27 MED ORDER — DOXYLAMINE SUCCINATE (SLEEP) 25 MG PO TABS: 25.0000 mg | ORAL_TABLET | Freq: Every evening | ORAL | Status: DC | PRN

## 2013-06-27 MED ORDER — SODIUM CHLORIDE 0.9 % IV SOLN: INTRAVENOUS | Status: DC

## 2013-06-27 MED ORDER — HYDROMORPHONE HCL PF 1 MG/ML IJ SOLN
1.0000 mg | Freq: Once | INTRAMUSCULAR | Status: AC
  Administered 2013-06-27: 1 mg via INTRAVENOUS
  Filled 2013-06-27: qty 1

## 2013-06-27 MED ORDER — ONDANSETRON HCL 4 MG/2ML IJ SOLN: 4.0000 mg | Freq: Three times a day (TID) | INTRAMUSCULAR | Status: AC | PRN

## 2013-06-27 MED ORDER — AMOXICILLIN-POT CLAVULANATE 875-125 MG PO TABS
1.0000 | ORAL_TABLET | Freq: Two times a day (BID) | ORAL | Status: DC
  Filled 2013-06-27: qty 1

## 2013-06-27 MED ORDER — SODIUM CHLORIDE 0.9 % IV SOLN
INTRAVENOUS | Status: DC
Start: 2013-06-28 — End: 2013-06-30
  Administered 2013-06-28 – 2013-06-30 (×5): via INTRAVENOUS

## 2013-06-27 MED ORDER — SODIUM CHLORIDE 0.9 % IV SOLN
INTRAVENOUS | Status: DC
  Administered 2013-06-27 (×2): via INTRAVENOUS

## 2013-06-27 MED ORDER — ONDANSETRON HCL 4 MG/2ML IJ SOLN
4.0000 mg | Freq: Once | INTRAMUSCULAR | Status: AC
  Administered 2013-06-27: 4 mg via INTRAVENOUS
  Filled 2013-06-27: qty 2

## 2013-06-27 MED ORDER — VITAMIN B-12 1000 MCG PO TABS
1000.0000 ug | ORAL_TABLET | Freq: Every day | ORAL | Status: DC
  Administered 2013-06-28 – 2013-06-29 (×2): 1000 ug via ORAL
  Filled 2013-06-27 (×3): qty 1

## 2013-06-27 MED ORDER — HYDROMORPHONE HCL PF 1 MG/ML IJ SOLN
1.0000 mg | INTRAMUSCULAR | Status: AC | PRN
  Administered 2013-06-28: 1 mg via INTRAVENOUS
  Filled 2013-06-27: qty 1

## 2013-06-27 MED ORDER — IOHEXOL 350 MG/ML SOLN
100.0000 mL | Freq: Once | INTRAVENOUS | Status: AC | PRN
Start: 2013-06-27 — End: 2013-06-27
  Administered 2013-06-27: 100 mL via INTRAVENOUS

## 2013-06-27 MED ORDER — FLUTICASONE PROPIONATE 50 MCG/ACT NA SUSP
2.0000 | Freq: Every day | NASAL | Status: DC
  Administered 2013-06-28 – 2013-06-29 (×2): 2 via NASAL
  Filled 2013-06-27: qty 16

## 2013-06-27 MED ORDER — SODIUM CHLORIDE 0.9 % IV BOLUS (SEPSIS)
500.0000 mL | Freq: Once | INTRAVENOUS | Status: AC
  Administered 2013-06-27: 500 mL via INTRAVENOUS

## 2013-06-27 MED ORDER — FERROUS SULFATE 325 (65 FE) MG PO TABS
325.0000 mg | ORAL_TABLET | Freq: Every day | ORAL | Status: DC
  Administered 2013-06-28: 325 mg via ORAL
  Filled 2013-06-27 (×2): qty 1

## 2013-06-27 MED ORDER — PROMETHAZINE HCL 25 MG/ML IJ SOLN
12.5000 mg | Freq: Once | INTRAMUSCULAR | Status: AC
  Administered 2013-06-27: 12.5 mg via INTRAVENOUS
  Filled 2013-06-27: qty 1

## 2013-06-27 MED ORDER — ASPIRIN 325 MG PO TABS
325.0000 mg | ORAL_TABLET | Freq: Every day | ORAL | Status: DC
  Administered 2013-06-28 – 2013-06-29 (×2): 325 mg via ORAL
  Filled 2013-06-27 (×3): qty 1

## 2013-06-27 MED ORDER — ASPIRIN 81 MG PO CHEW
324.0000 mg | CHEWABLE_TABLET | Freq: Once | ORAL | Status: AC
  Administered 2013-06-27: 324 mg via ORAL
  Filled 2013-06-27: qty 4

## 2013-06-27 MED ORDER — DIPHENHYDRAMINE HCL 25 MG PO CAPS: 25.0000 mg | ORAL_CAPSULE | Freq: Every evening | ORAL | Status: DC | PRN

## 2013-06-27 NOTE — ED Notes (Signed)
Pt reports return of nausea. 

## 2013-06-27 NOTE — ED Notes (Signed)
Pt reports not feeling well and feeling SOB and having pain on her arm.  Pt's o2 sat is 100%RA and pt is breathing about 22 breathes/min.

## 2013-06-27 NOTE — ED Notes (Signed)
Pt presents with c/o chest pain on the right side. Pt says that the chest pain started one hour ago with associated shortness of breath and dizziness. Pt also c/o right arm pain that has been going on all day. Pt denies any injury to her right arm. Pt also reports that she has been slurring her words for about half an hour. Pt is talking in complete sentences at this time at triage and is in no respiratory distress.

## 2013-06-27 NOTE — ED Notes (Signed)
Pt reports 5/10 pain in right arm, neck, and chest.

## 2013-06-27 NOTE — ED Notes (Signed)
Dr. Patel at bedside 

## 2013-06-27 NOTE — H&P (Signed)
[facility] History and Physical  Patient: Danielle Holden  OZH:[id number]  DOB: 06-11-1970  DOS: the patient was seen and examined on 06/27/2013 PCP: Provider Not In System  Chief Complaint: Chest pain and right-sided weakness  HPI: Danielle Holden is a 43 y.o. female with Past medical history of anemia anxiety questionable lupus. The patient is coming from home The patient presented with complaints of chest tightness and right-sided weakness that started around 7 AM. She woke up from her sleep with the symptoms. She has chest tightness which was on and off occurring throughout the day resolving on its own located bilaterally associated with shortness of breath and right sided weakness and numbness. She had dizziness along with that. She continues to have some chest pain but mentions at this not as bad as it was earlier. Along with that she had a right-sided weakness and numbness of both upper and lower extremity as well as right face. She initially had some headache which is better now denies any neck pain or fever or chills. She also had some paresthesia on the right and when she was walking she was swaying to the right side. She denies any similar symptoms or hospitalizations for the same in the past. 2 weeks ago she had an episode of acute sinusitis for which she was placed on Augmentin which she is still continuing for one more day. She was using ibuprofen and Sudafed which is not using since last week. Other than this no recent change in her medication. She had some soft stool but no diarrhea and no active bleeding in her bowel no abdominal pain. She denies any smoking history, alcohol abuse, drug abuse.  Review of Systems: as mentioned in the history of present illness.  A Comprehensive review of the other systems is negative.  Past Medical History  Diagnosis Date  . Anxiety   . Arthritis   . Anemia   . Lupus   . Hypoglycemia    Past Surgical History  Procedure Laterality  Date  . Cesarean section    . Appendectomy    . Tubal ligation     Social History:  reports that she has never smoked. She has never used smokeless tobacco. She reports that she does not drink alcohol or use illicit drugs. Independent for most of her  ADL.  Allergies  Allergen Reactions  . Trazodone And Nefazodone Anaphylaxis    Tongue swells.    No family history on file.  Prior to Admission medications   Medication Sig Start Date End Date Taking? Authorizing Provider  amoxicillin-clavulanate (AUGMENTIN) 875-125 MG per tablet Take 1 tablet by mouth 2 (two) times daily. For 10 days   Yes Historical Provider, MD  doxylamine, Sleep, (UNISOM) 25 MG tablet Take 25 mg by mouth at bedtime as needed for sleep.    Yes Historical Provider, MD  fluticasone (FLONASE) 50 MCG/ACT nasal spray Place 2 sprays into both nostrils daily. 05/06/13  Yes Mora Bellman, PA-C  guaiFENesin (ROBITUSSIN) 100 MG/5ML SOLN Take 5 mLs (100 mg total) by mouth every 4 (four) hours as needed for cough or to loosen phlegm. 05/06/13  Yes Mora Bellman, PA-C  ibuprofen (ADVIL,MOTRIN) 600 MG tablet Take 600 mg by mouth every 6 (six) hours as needed for pain.   Yes Historical Provider, MD  pseudoephedrine (SUDAFED 12 HOUR) 120 MG 12 hr tablet Take 1 tablet (120 mg total) by mouth 2 (two) times daily. 05/06/13  Yes Mora Bellman, PA-C  vitamin  B-12 (CYANOCOBALAMIN) 1000 MCG tablet Take 1,000 mcg by mouth daily.   Yes Historical Provider, MD    Physical Exam: Filed Vitals:   06/27/13 1900 06/27/13 1919 06/27/13 2151 06/27/13 2315  BP: 110/67 110/67 130/72   Pulse: 84 89 101 99  Temp:      TempSrc:      Resp: 14 15 20 15   SpO2: 97% 96% 100% 100%    General: Alert, Awake and Oriented to Time, Place and Person. Appear in mild distress Eyes: PERRL ENT: Oral Mucosa clear moist. Neck: NO JVD Cardiovascular: S1 and S2 Present, NO Murmur, Peripheral Pulses Present Respiratory: Bilateral Air entry equal and  Decreased, Clear to Auscultation,  NO Crackles,NO wheezes Abdomen: Bowel Sound Present, Soft and Non tender Skin: NO Rash Extremities: NO Pedal edema, NO calf tenderness Neurologic: Mental status alert awake and oriented, normal speech, normal attention, Cranial Nerves pupils are reactive, extraocular muscle movement intact, Motor strength right-sided weakness 4/5 left side normal 5 out of 5, Sensation limited sensation to light touch on right face, and, leg, reflexes intact bilaterally, babinski negative, Proprioception abnormal on right side, Cerebellar test bilaterally normal finger-nose-finger and heel-to-shin.  Labs on Admission:  CBC:  Recent Labs Lab 06/27/13 1816  WBC 8.9  NEUTROABS 5.6  HGB 8.9*  HCT 29.4*  MCV 66.1*  PLT 359    CMP     Component Value Date/Time   NA 137 06/27/2013 1816   K 3.6* 06/27/2013 1816   CL 101 06/27/2013 1816   CO2 22 06/27/2013 1816   GLUCOSE 74 06/27/2013 1816   BUN 8 06/27/2013 1816   CREATININE 0.61 06/27/2013 1816   CALCIUM 8.6 06/27/2013 1816   PROT 7.4 06/27/2013 1816   ALBUMIN 3.7 06/27/2013 1816   AST 12 06/27/2013 1816   ALT 8 06/27/2013 1816   ALKPHOS 76 06/27/2013 1816   BILITOT <0.2* 06/27/2013 1816   GFRNONAA >90 06/27/2013 1816   GFRAA >90 06/27/2013 1816     Recent Labs Lab 06/27/13 1816  LIPASE 34   No results found for this basename: AMMONIA,  in the last 168 hours   Recent Labs Lab 06/27/13 1816 06/27/13 1923  TROPONINI <0.30 <0.30   BNP (last 3 results)  Recent Labs  06/27/13 1816  PROBNP 90.9    Radiological Exams on Admission: Ct Head Wo Contrast  06/27/2013   CLINICAL DATA:  Right-sided headache. Right arm pain. Slurred speech.  EXAM: CT HEAD WITHOUT CONTRAST  TECHNIQUE: Contiguous axial images were obtained from the base of the skull through the vertex without intravenous contrast.  COMPARISON:  Head CT 09/27/2012.  FINDINGS: No acute intracranial abnormalities. Specifically, no evidence of acute  intracranial hemorrhage, no definite findings of acute/subacute cerebral ischemia, no mass, mass effect, hydrocephalus or abnormal intra or extra-axial fluid collections. Visualized paranasal sinuses and mastoids are well pneumatized. No acute displaced skull fractures are identified.  IMPRESSION: * No acute intracranial abnormalities. *The appearance of the brain is normal.   Electronically Signed   By: 09/29/2012 M.D.   On: 06/27/2013 18:29   Ct Angio Chest Pe W/cm &/or Wo Cm  06/27/2013   CLINICAL DATA:  Right-sided chest pain, shortness of breath and dizziness. History of lupus and anemia.  EXAM: CT ANGIOGRAPHY CHEST WITH CONTRAST  TECHNIQUE: Multidetector CT imaging of the chest was performed using the standard protocol during bolus administration of intravenous contrast. Multiplanar CT image reconstructions and MIPs were obtained to evaluate the vascular anatomy.  CONTRAST:  06/29/2013  OMNIPAQUE IOHEXOL 350 MG/ML SOLN  COMPARISON:  Chest x-ray obtained earlier today; CT abdomen/ pelvis 02/25/2013  FINDINGS: Mediastinum: Unremarkable CT appearance of the thyroid gland. No suspicious mediastinal or hilar adenopathy. No soft tissue mediastinal mass. Mildly patulous thoracic esophagus containing a small volume of debris.  Heart/Vascular: Adequate opacification of the pulmonary arteries to the proximal subsegmental level. Respiratory motion slightly limits evaluation beyond the segmental arteries. No central filling defect to suggest acute pulmonary embolus. Normal caliber main and central pulmonary arteries. No acute aortic abnormality. There is a bovine configuration of the aortic arch (two vessel arch with common origin of the brachiocephalic and left common carotid arteries), a normal anatomic variant. The heart is within normal limits for size. No pericardial effusion.  Lungs/Pleura: Mild dependent atelectasis in the lower lobes. Otherwise, the lungs are clear. 2 mm nodular opacity affiliated with the  minor fissure likely reflects a tiny subpleural lymph node. This is almost certainly benign. No pleural effusion or pneumothorax.  Bones/Soft Tissues: No acute fracture or aggressive appearing lytic or blastic osseous lesion.  Upper Abdomen: Unremarkable imaged upper abdomen.  Review of the MIP images confirms the above findings.  IMPRESSION: 1. Negative for pulmonary embolus, pneumonia or other acute cardiopulmonary process. 2. Mildly patulous esophagus containing layering fluid. Query clinical history of gastroesophageal reflux disease?   Electronically Signed   By: Malachy MoanHeath  McCullough M.D.   On: 06/27/2013 20:15   Dg Chest Port 1 View  06/27/2013   CLINICAL DATA:  Chest pain  EXAM: PORTABLE CHEST - 1 VIEW  COMPARISON:  None.  FINDINGS: The lungs are clear and negative for focal airspace consolidation, pulmonary edema or suspicious pulmonary nodule. No pleural effusion or pneumothorax. Cardiac and mediastinal contours are within normal limits. No acute fracture or lytic or blastic osseous lesions. The visualized upper abdominal bowel gas pattern is unremarkable.  IMPRESSION: No active cardiopulmonary disease.   Electronically Signed   By: Malachy MoanHeath  McCullough M.D.   On: 06/27/2013 18:08    EKG: Independently reviewed. normal EKG, normal sinus rhythm.  Assessment/Plan Principal Problem:   Acute right-sided weakness Active Problems:   TIA (transient ischemic attack)   Anemia   Menorrhagia   Chest pain   1. Acute right-sided weakness The patient is presenting with complaints of right-sided weakness and tingling and numbness. On examination she has sensory motor as well as proprioception loss on right side involving face hand and leg. Her CT scan of the head is negative. Neurologic has been consulted. Patient will be going for an MRI with and without as well as MRA. I will place her on a dose of aspirin and serial neuro checks and telemetry.  2. Chest tightness The patient also had some chest  tightness on and off throughout the day. Her 2 sets of troponin and 2 repeated EKGs does not show any signs of acute ischemia. A CT angiogram has been done which has ruled out possibility of pulmonary embolism or infection. At present she is chest pain-free we will continue to monitor her and obtain an echocardiogram and a repeat troponin in the morning.  3. Sinus tachycardia The patient has sinus tachycardia I would gently hydrate her with IV fluids  4. Possible iron deficiency anemia due to menorrhagia We'll check iron levels, place her on iron supplements, she has ongoing menstrual period at present and she is on her third day. She has an appointment with OB/GYN as an outpatient for the same.  Consults: Neurology  DVT Prophylax mechanical  compression device Nutrition: Advance as tolerated cardiac  Code Status: Full  Disposition: Admitted to observation in telemetry unit.  Author: Lynden Oxford, MD [facility] Pager: [phone] 06/27/2013, 11:47 PM    If 7PM-7AM, please contact night-coverage [email or web address] Password [email or web address]

## 2013-06-27 NOTE — ED Notes (Signed)
Pt noted to be retching while being transport to CT.

## 2013-06-27 NOTE — ED Notes (Signed)
Patient transported to CT 

## 2013-06-27 NOTE — ED Notes (Signed)
Pt returned from XRAY 

## 2013-06-27 NOTE — ED Provider Notes (Signed)
CSN: [id number]     Arrival date & time 06/27/13  1652 History   First MD Initiated Contact with Patient 06/27/13 1714     Chief Complaint  Patient presents with  . Chest Pain  . Arm Pain     (Consider location/radiation/quality/duration/timing/severity/associated sxs/prior Treatment) The history is provided by the patient.   43 year old the female with acute onset at 8 o'clock in the morning of chest pain both sides right greater than left radiated to the neck right to the back right arm and down into right leg. Associated with a right-sided headache only but not severe no nausea no vomiting. The patient shortly after the onset of the pain noted that she had weakness in her right hand questionable numbness in that her fingers contracted. As the day went on she noted that she had weakness in her right leg and right arm and that when she would walk she would the deviate to the right. Never anything like this before. In route to the emergency department she developed speech problems. Which resolved shortly after she got here. Patient's speech was apparently slurred and difficulty with getting words out.  Past Medical History  Diagnosis Date  . Anxiety   . Arthritis   . Anemia   . Lupus   . Hypoglycemia    Past Surgical History  Procedure Laterality Date  . Cesarean section    . Appendectomy    . Tubal ligation     No family history on file. History  Substance Use Topics  . Smoking status: Never Smoker   . Smokeless tobacco: Never Used  . Alcohol Use: No   OB History   Grav Para Term Preterm Abortions TAB SAB Ect Mult Living                 Review of Systems  Constitutional: Negative for fever.  HENT: Negative for congestion and sore throat.   Respiratory: Positive for shortness of breath. Negative for cough.   Cardiovascular: Positive for chest pain. Negative for leg swelling.  Gastrointestinal: Negative for nausea, vomiting, abdominal pain and diarrhea.  Genitourinary:  Positive for vaginal bleeding. Negative for dysuria.  Musculoskeletal: Positive for myalgias.  Skin: Negative for rash.  Neurological: Positive for dizziness, speech difficulty, weakness, numbness and headaches.  Hematological: Does not bruise/bleed easily.  Psychiatric/Behavioral: Negative for confusion.      Allergies  Trazodone and nefazodone  Home Medications   Current Outpatient Rx  Name  Route  Sig  Dispense  Refill  . amoxicillin-clavulanate (AUGMENTIN) 875-125 MG per tablet   Oral   Take 1 tablet by mouth 2 (two) times daily. For 10 days         . doxylamine, Sleep, (UNISOM) 25 MG tablet   Oral   Take 25 mg by mouth at bedtime as needed for sleep.          . fluticasone (FLONASE) 50 MCG/ACT nasal spray   Each Nare   Place 2 sprays into both nostrils daily.   16 g   0   . guaiFENesin (ROBITUSSIN) 100 MG/5ML SOLN   Oral   Take 5 mLs (100 mg total) by mouth every 4 (four) hours as needed for cough or to loosen phlegm.   1200 mL   0   . ibuprofen (ADVIL,MOTRIN) 600 MG tablet   Oral   Take 600 mg by mouth every 6 (six) hours as needed for pain.         . pseudoephedrine (SUDAFED 12  HOUR) 120 MG 12 hr tablet   Oral   Take 1 tablet (120 mg total) by mouth 2 (two) times daily.   20 tablet   0   . vitamin B-12 (CYANOCOBALAMIN) 1000 MCG tablet   Oral   Take 1,000 mcg by mouth daily.          BP 130/72  Pulse 101  Temp(Src) 98.3 F (36.8 C) (Oral)  Resp 20  SpO2 100%  LMP 06/27/2013 Physical Exam  Nursing note and vitals reviewed. Constitutional: She is oriented to person, place, and time. She appears well-developed and well-nourished. She appears distressed.  HENT:  Head: Normocephalic and atraumatic.  Mouth/Throat: Oropharynx is clear and moist.  Eyes: Conjunctivae and EOM are normal. Pupils are equal, round, and reactive to light.  Neck: Normal range of motion.  Cardiovascular: Normal rate and regular rhythm.   Pulmonary/Chest: Effort normal  and breath sounds normal. No respiratory distress.  Abdominal: Soft. Bowel sounds are normal. There is no tenderness.  Musculoskeletal: Normal range of motion. She exhibits no edema and no tenderness.  Subjectively patient with right arm right leg and right neck pain.  Neurological: She is alert and oriented to person, place, and time. No cranial nerve deficit. Coordination abnormal.  Patient with right arm drift. In some weakness in the right leg subtle but present. No speech abnormalities now.    ED Course  Procedures (including critical care time) Labs Review Labs Reviewed  COMPREHENSIVE METABOLIC PANEL - Abnormal; Notable for the following:    Potassium 3.6 (*)    Total Bilirubin <0.2 (*)    All other components within normal limits  CBC WITH DIFFERENTIAL - Abnormal; Notable for the following:    Hemoglobin 8.9 (*)    HCT 29.4 (*)    MCV 66.1 (*)    MCH 20.0 (*)    RDW 17.8 (*)    All other components within normal limits  URINALYSIS, ROUTINE W REFLEX MICROSCOPIC - Abnormal; Notable for the following:    Color, Urine RED (*)    APPearance CLOUDY (*)    Hgb urine dipstick LARGE (*)    Protein, ur 30 (*)    Leukocytes, UA SMALL (*)    All other components within normal limits  LIPASE, BLOOD  TROPONIN I  PRO B NATRIURETIC PEPTIDE  URINE MICROSCOPIC-ADD ON  TROPONIN I   Results for orders placed during the hospital encounter of 06/27/13  LIPASE, BLOOD      Result Value Ref Range   Lipase 34  11 - 59 U/L  COMPREHENSIVE METABOLIC PANEL      Result Value Ref Range   Sodium 137  137 - 147 mEq/L   Potassium 3.6 (*) 3.7 - 5.3 mEq/L   Chloride 101  96 - 112 mEq/L   CO2 22  19 - 32 mEq/L   Glucose, Bld 74  70 - 99 mg/dL   BUN 8  6 - 23 mg/dL   Creatinine, Ser 4.090.61  0.50 - 1.10 mg/dL   Calcium 8.6  8.4 - 81.110.5 mg/dL   Total Protein 7.4  6.0 - 8.3 g/dL   Albumin 3.7  3.5 - 5.2 g/dL   AST 12  0 - 37 U/L   ALT 8  0 - 35 U/L   Alkaline Phosphatase 76  39 - 117 U/L   Total  Bilirubin <0.2 (*) 0.3 - 1.2 mg/dL   GFR calc non Af Amer >90  >90 mL/min   GFR calc Af Amer >90  >  90 mL/min  CBC WITH DIFFERENTIAL      Result Value Ref Range   WBC 8.9  4.0 - 10.5 K/uL   RBC 4.45  3.87 - 5.11 MIL/uL   Hemoglobin 8.9 (*) 12.0 - 15.0 g/dL   HCT 83.2 (*) 54.9 - 82.6 %   MCV 66.1 (*) 78.0 - 100.0 fL   MCH 20.0 (*) 26.0 - 34.0 pg   MCHC 30.3  30.0 - 36.0 g/dL   RDW 41.5 (*) 83.0 - 94.0 %   Platelets 359  150 - 400 K/uL   Neutrophils Relative % 63  43 - 77 %   Lymphocytes Relative 27  12 - 46 %   Monocytes Relative 8  3 - 12 %   Eosinophils Relative 2  0 - 5 %   Basophils Relative 0  0 - 1 %   Neutro Abs 5.6  1.7 - 7.7 K/uL   Lymphs Abs 2.4  0.7 - 4.0 K/uL   Monocytes Absolute 0.7  0.1 - 1.0 K/uL   Eosinophils Absolute 0.2  0.0 - 0.7 K/uL   Basophils Absolute 0.0  0.0 - 0.1 K/uL   Smear Review MORPHOLOGY UNREMARKABLE    TROPONIN I      Result Value Ref Range   Troponin I <0.30  <0.30 ng/mL  URINALYSIS, ROUTINE W REFLEX MICROSCOPIC      Result Value Ref Range   Color, Urine RED (*) YELLOW   APPearance CLOUDY (*) CLEAR   Specific Gravity, Urine 1.027  1.005 - 1.030   pH 6.0  5.0 - 8.0   Glucose, UA NEGATIVE  NEGATIVE mg/dL   Hgb urine dipstick LARGE (*) NEGATIVE   Bilirubin Urine NEGATIVE  NEGATIVE   Ketones, ur NEGATIVE  NEGATIVE mg/dL   Protein, ur 30 (*) NEGATIVE mg/dL   Urobilinogen, UA 0.2  0.0 - 1.0 mg/dL   Nitrite NEGATIVE  NEGATIVE   Leukocytes, UA SMALL (*) NEGATIVE  PRO B NATRIURETIC PEPTIDE      Result Value Ref Range   Pro B Natriuretic peptide (BNP) 90.9  0 - 125 pg/mL  URINE MICROSCOPIC-ADD ON      Result Value Ref Range   WBC, UA 0-2  <3 WBC/hpf   RBC / HPF TOO NUMEROUS TO COUNT  <3 RBC/hpf   Urine-Other FIELD OBSCURED BY RBC'S    TROPONIN I      Result Value Ref Range   Troponin I <0.30  <0.30 ng/mL    Imaging Review Ct Head Wo Contrast  06/27/2013   CLINICAL DATA:  Right-sided headache. Right arm pain. Slurred speech.  EXAM: CT HEAD  WITHOUT CONTRAST  TECHNIQUE: Contiguous axial images were obtained from the base of the skull through the vertex without intravenous contrast.  COMPARISON:  Head CT 09/27/2012.  FINDINGS: No acute intracranial abnormalities. Specifically, no evidence of acute intracranial hemorrhage, no definite findings of acute/subacute cerebral ischemia, no mass, mass effect, hydrocephalus or abnormal intra or extra-axial fluid collections. Visualized paranasal sinuses and mastoids are well pneumatized. No acute displaced skull fractures are identified.  IMPRESSION: * No acute intracranial abnormalities. *The appearance of the brain is normal.   Electronically Signed   By: Trudie Reed M.D.   On: 06/27/2013 18:29   Ct Angio Chest Pe W/cm &/or Wo Cm  06/27/2013   CLINICAL DATA:  Right-sided chest pain, shortness of breath and dizziness. History of lupus and anemia.  EXAM: CT ANGIOGRAPHY CHEST WITH CONTRAST  TECHNIQUE: Multidetector CT imaging of the chest was  performed using the standard protocol during bolus administration of intravenous contrast. Multiplanar CT image reconstructions and MIPs were obtained to evaluate the vascular anatomy.  CONTRAST:  OMNIPAQUE IOHEXOL 350 MG/ML SOLN  COMPARISON:  Chest x-ray obtained earlier today; CT abdomen/ pelvis 02/25/2013  FINDINGS: Mediastinum: Unremarkable CT appearance of the thyroid gland. No suspicious mediastinal or hilar adenopathy. No soft tissue mediastinal mass. Mildly patulous thoracic esophagus containing a small volume of debris.  Heart/Vascular: Adequate opacification of the pulmonary arteries to the proximal subsegmental level. Respiratory motion slightly limits evaluation beyond the segmental arteries. No central filling defect to suggest acute pulmonary embolus. Normal caliber main and central pulmonary arteries. No acute aortic abnormality. There is a bovine configuration of the aortic arch (two vessel arch with common origin of the brachiocephalic and left  common carotid arteries), a normal anatomic variant. The heart is within normal limits for size. No pericardial effusion.  Lungs/Pleura: Mild dependent atelectasis in the lower lobes. Otherwise, the lungs are clear. 2 mm nodular opacity affiliated with the minor fissure likely reflects a tiny subpleural lymph node. This is almost certainly benign. No pleural effusion or pneumothorax.  Bones/Soft Tissues: No acute fracture or aggressive appearing lytic or blastic osseous lesion.  Upper Abdomen: Unremarkable imaged upper abdomen.  Review of the MIP images confirms the above findings.  IMPRESSION: 1. Negative for pulmonary embolus, pneumonia or other acute cardiopulmonary process. 2. Mildly patulous esophagus containing layering fluid. Query clinical history of gastroesophageal reflux disease?   Electronically Signed   By: Malachy Moan M.D.   On: 06/27/2013 20:15   Dg Chest Port 1 View  06/27/2013   CLINICAL DATA:  Chest pain  EXAM: PORTABLE CHEST - 1 VIEW  COMPARISON:  None.  FINDINGS: The lungs are clear and negative for focal airspace consolidation, pulmonary edema or suspicious pulmonary nodule. No pleural effusion or pneumothorax. Cardiac and mediastinal contours are within normal limits. No acute fracture or lytic or blastic osseous lesions. The visualized upper abdominal bowel gas pattern is unremarkable.  IMPRESSION: No active cardiopulmonary disease.   Electronically Signed   By: Malachy Moan M.D.   On: 06/27/2013 18:08    EKG Interpretation   None      Date: 06/27/2013  Rate: 89  Rhythm: normal sinus rhythm  QRS Axis: normal  Intervals: normal  ST/T Wave abnormalities: nonspecific T wave changes  Conduction Disutrbances:none  Narrative Interpretation:   Old EKG Reviewed: none available     MDM   Final diagnoses:  Chest pain  Hematuria  Right sided weakness  TIA (transient ischemic attack)    Patient's past medical history this is a history of lupus however this never  fully been confirmed. Came up on screening patient says she never had any symptoms. Today's set of events is a little perplexing to explain his 1 event. Patient with chest pain that is still ongoing that started at 8:00 this morning but had 2 negative troponins. Also EKG without acute changes. CT angios negative. Chest x-ray negative for pneumonia. Patient also with some right-sided neck and right-sided arm and leg pain it is difficult to explain but associated with weakness and a gait that tends to lean ports the right. Patient also in the way here had speech problems where she couldn't get her words out properly and speech seemed to be slurred. Patient denies any significant headache. No fevers. Head CT was negative but MRI was not available after the chest pain workup. Patient recently says some persistent nausea  she says it's due to the IV dye from the CT angios. We'll discuss with the hospitalist about admission for possible TIA as an additional problem also MS could explain her symptoms. MRI would be helpful in this workup. I guess that besides MS or TIA I guess with some of the headache although not severe atypical migraine is a possibility with the neurological findings.  Neuro hospitalist will see the patient. They agree with getting an MRI.  The hematuria the patient is currently having is due to menstrual bleeding which is ongoing right now. Patient does have an anemia has a history of anemia seems to be worse. Patient also has past history of lupus however she states that the tests have been negative sometimes positive other times does have arthritis and this would lead to doing this test.  Shelda Jakes, MD 06/27/13 [phone]

## 2013-06-27 NOTE — ED Notes (Signed)
MD at bedside. 

## 2013-06-27 NOTE — ED Notes (Signed)
Pt transported to CT ?

## 2013-06-28 ENCOUNTER — Observation Stay (HOSPITAL_COMMUNITY): Payer: Medicaid Other

## 2013-06-28 DIAGNOSIS — G459 Transient cerebral ischemic attack, unspecified: Secondary | ICD-10-CM

## 2013-06-28 LAB — TROPONIN I

## 2013-06-28 LAB — CBC
HCT: 25.1 % — ABNORMAL LOW (ref 36.0–46.0)
HEMOGLOBIN: 7.4 g/dL — AB (ref 12.0–15.0)
MCH: 19.7 pg — ABNORMAL LOW (ref 26.0–34.0)
MCHC: 29.5 g/dL — ABNORMAL LOW (ref 30.0–36.0)
MCV: 66.9 fL — AB (ref 78.0–100.0)
Platelets: 290 10*3/uL (ref 150–400)
RBC: 3.75 MIL/uL — AB (ref 3.87–5.11)
RDW: 18.1 % — ABNORMAL HIGH (ref 11.5–15.5)
WBC: 10.3 10*3/uL (ref 4.0–10.5)

## 2013-06-28 LAB — GLUCOSE, CAPILLARY
GLUCOSE-CAPILLARY: 97 mg/dL (ref 70–99)
GLUCOSE-CAPILLARY: 94 mg/dL (ref 70–99)
Glucose-Capillary: 115 mg/dL — ABNORMAL HIGH (ref 70–99)
Glucose-Capillary: 63 mg/dL — ABNORMAL LOW (ref 70–99)
Glucose-Capillary: 83 mg/dL (ref 70–99)
Glucose-Capillary: 97 mg/dL (ref 70–99)

## 2013-06-28 LAB — LIPID PANEL
CHOL/HDL RATIO: 2 ratio
Cholesterol: 125 mg/dL (ref 0–200)
HDL: 61 mg/dL (ref >39)
LDL CALC: 57 mg/dL (ref 0–99)
TRIGLYCERIDES: 34 mg/dL (ref <150)
VLDL: 7 mg/dL (ref 0–40)

## 2013-06-28 LAB — RAPID URINE DRUG SCREEN, HOSP PERFORMED
Amphetamines: NOT DETECTED
BENZODIAZEPINES: NOT DETECTED
Barbiturates: NOT DETECTED
Cocaine: NOT DETECTED
Opiates: NOT DETECTED
Tetrahydrocannabinol: NOT DETECTED

## 2013-06-28 LAB — FERRITIN: FERRITIN: 2 ng/mL — AB (ref 10–291)

## 2013-06-28 LAB — MAGNESIUM: Magnesium: 2 mg/dL (ref 1.5–2.5)

## 2013-06-28 LAB — IRON AND TIBC
IRON: 15 ug/dL — AB (ref 42–135)
SATURATION RATIOS: 4 % — AB (ref 20–55)
TIBC: 423 ug/dL (ref 250–470)
UIBC: 408 ug/dL — ABNORMAL HIGH (ref 125–400)

## 2013-06-28 LAB — FOLATE RBC: RBC Folate: 1196 ng/mL — ABNORMAL HIGH (ref >280)

## 2013-06-28 LAB — TSH: TSH: 1.387 u[IU]/mL (ref 0.350–4.500)

## 2013-06-28 LAB — HEMOGLOBIN A1C
Hgb A1c MFr Bld: 5.3 % (ref <5.7)
Mean Plasma Glucose: 105 mg/dL (ref <117)

## 2013-06-28 LAB — VITAMIN B12: VITAMIN B 12: 380 pg/mL (ref 211–911)

## 2013-06-28 LAB — CORTISOL: Cortisol, Plasma: 3.7 ug/dL

## 2013-06-28 MED ORDER — DEXTROSE 50 % IV SOLN
INTRAVENOUS | Status: AC
  Administered 2013-06-28: 25 mL
  Filled 2013-06-28: qty 50

## 2013-06-28 MED ORDER — LORAZEPAM 2 MG/ML IJ SOLN
1.0000 mg | Freq: Once | INTRAMUSCULAR | Status: AC
  Administered 2013-06-28: 1 mg via INTRAVENOUS
  Filled 2013-06-28: qty 1

## 2013-06-28 MED ORDER — COSYNTROPIN 0.25 MG IJ SOLR
0.2500 mg | Freq: Once | INTRAMUSCULAR | Status: AC
  Administered 2013-06-29: 0.25 mg via INTRAVENOUS
  Filled 2013-06-28: qty 0.25

## 2013-06-28 MED ORDER — AMOXICILLIN-POT CLAVULANATE 875-125 MG PO TABS
1.0000 | ORAL_TABLET | Freq: Two times a day (BID) | ORAL | Status: AC
  Administered 2013-06-28 (×2): 1 via ORAL
  Filled 2013-06-28 (×3): qty 1

## 2013-06-28 MED ORDER — GLUCOSE 40 % PO GEL: 1.0000 | ORAL | Status: DC | PRN

## 2013-06-28 MED ORDER — GADOBENATE DIMEGLUMINE 529 MG/ML IV SOLN
12.0000 mL | Freq: Once | INTRAVENOUS | Status: AC | PRN
  Administered 2013-06-28: 12 mL via INTRAVENOUS

## 2013-06-28 MED ORDER — ACETAMINOPHEN 325 MG PO TABS
650.0000 mg | ORAL_TABLET | Freq: Four times a day (QID) | ORAL | Status: DC | PRN
  Administered 2013-06-28 – 2013-06-29 (×2): 650 mg via ORAL
  Filled 2013-06-28 (×2): qty 2

## 2013-06-28 MED ORDER — FERROUS SULFATE 325 (65 FE) MG PO TABS
325.0000 mg | ORAL_TABLET | Freq: Three times a day (TID) | ORAL | Status: DC
  Administered 2013-06-28 – 2013-06-30 (×5): 325 mg via ORAL
  Filled 2013-06-28 (×8): qty 1

## 2013-06-28 NOTE — Progress Notes (Signed)
  Echocardiogram 2D Echocardiogram has been performed.  Arvil Chaco 06/28/2013, 8:51 AM

## 2013-06-28 NOTE — Progress Notes (Signed)
Hypoglycemic Event  CBG: 63  Treatment: D50 IV 25 mL  Symptoms: None  Follow-up CBG: Time:0805 CBG Result:97  Possible Reasons for Event: Unknown  Comments/MD notified:Dr. Philip Aspen  Remember to initiate Hypoglycemia Order Set & complete

## 2013-06-28 NOTE — Progress Notes (Signed)
PT Cancellation Note  Patient Details Name: Danielle Holden MRN: [id number] DOB: 08-10-1970   Cancelled Treatment:    Reason Eval/Treat Not Completed: Fatigue/lethargy limiting ability to participate (pt had ativan/multiple tests.)   Rada Hay 06/28/2013, 2:00 PM Blanchard Kelch PT [phone]

## 2013-06-28 NOTE — Progress Notes (Signed)
*  PRELIMINARY RESULTS* Vascular Ultrasound Carotid Duplex (Doppler) has been completed.  Preliminary findings: Bilateral:  1-39% ICA stenosis.  Vertebral artery flow is antegrade.      Farrel Demark, RDMS, RVT  06/28/2013, 7:38 AM

## 2013-06-28 NOTE — Consult Note (Signed)
NEURO HOSPITALIST CONSULT NOTE    Reason for Consult: weakness and paresthesias right hemibody, unsteadiness.  HPI:                                                                                                                                          Danielle Holden is an 43 y.o. female, right handed, with a past medical history significant for anxiety, arthritis, questionable Lupus, admitted to the hospital for further evaluation of the above stated manifestations. She said that she never had similar symptoms before, but woke up around 7 am today with " numbness and stiffness of the right hand involving mainly the V, IV, and III fingers". Stated that she went back to sleep but when she woke up again couple of hours later she also started having chest pain and numbness-tingling of the right face-arm-leg accompanied by a " heavy sensation of the right side and feeling that the right side of my brain was swollen and full of water". Stated that these symptoms got worse as the day went by, and also developed unsteadiness with a tendency to lean to the right and slurred speech that lasted couple of hours while in the ED. Complains of neck discomfort. Denies associated HA, vertigo, double vision, painful visual loss, difficulty swallowing, or visual disturbances. No recent fever, infection, head trauma, falls, vaccinations, or foreign travel. CT/CTA brain in the ED revealed no acute abnormality. Danielle Holden expressed that she is feeling better because her symptoms are less noticeable now.  Past Medical History  Diagnosis Date  . Anxiety   . Arthritis   . Anemia   . Lupus   . Hypoglycemia     Past Surgical History  Procedure Laterality Date  . Cesarean section    . Appendectomy    . Tubal ligation      No family history on file.   Social History:  reports that she has never smoked. She has never used smokeless tobacco. She reports that she does not drink alcohol or  use illicit drugs.  Allergies  Allergen Reactions  . Trazodone And Nefazodone Anaphylaxis    Tongue swells.    MEDICATIONS:  I have reviewed the patient's current medications.   ROS:                                                                                                                                       History obtained from the patient and chart review.  General ROS: negative for - chills, fatigue, fever, night sweats, weight gain or weight loss Psychological ROS: negative for - behavioral disorder, hallucinations, memory difficulties, or suicidal ideation Ophthalmic ROS: negative for - blurry vision, double vision, eye pain or loss of vision ENT ROS: negative for - epistaxis, nasal discharge, oral lesions, sore throat, tinnitus or vertigo Allergy and Immunology ROS: negative for - hives or itchy/watery eyes Hematological and Lymphatic ROS: negative for - bleeding problems, bruising or swollen lymph nodes Endocrine ROS: negative for - galactorrhea, hair pattern changes, polydipsia/polyuria or temperature intolerance Respiratory ROS: negative for - cough, hemoptysis, shortness of breath or wheezing Cardiovascular ROS: negative for - dyspnea on exertion, edema or irregular heartbeat Gastrointestinal ROS: negative for - abdominal pain, diarrhea, hematemesis, nausea/vomiting or stool incontinence Genito-Urinary ROS: negative for - dysuria, hematuria, incontinence or urinary frequency/urgency Musculoskeletal ROS: negative for - joint swelling or muscular weakness Neurological ROS: as noted in HPI Dermatological ROS: negative for rash and skin lesion changes   Physical exam: pleasant female in no apparent distress.Blood pressure 99/52, pulse 70, temperature 98.2 F (36.8 C), temperature source Oral, resp. rate 15, height $RemoveBe'5\' 2"'sgyMFXrop$  (1.575 m), weight 62.6 kg (138 lb 0.1  oz), last menstrual period 06/27/2013, SpO2 99.00%.  Head: normocephalic. Neck: supple, no bruits, no JVD. Cardiac: no murmurs. Lungs: clear. Abdomen: soft, no tender, no mass. Extremities: no edema.  Neurologic Examination:                                                                                                      Mental Status: Alert, oriented, thought content appropriate.  Speech fluent without evidence of aphasia.  Able to follow 3 step commands without difficulty. Cranial Nerves: II: Discs flat bilaterally; Visual fields grossly normal, pupils equal, round, reactive to light and accommodation III,IV, VI: ptosis not present, extra-ocular motions intact bilaterally V,VII: smile symmetric, facial light touch sensation normal bilaterally VIII: hearing normal bilaterally IX,X: gag reflex present XI: bilateral shoulder shrug XII: midline tongue extension without atrophy or fasciculations  Motor: 5/5 left side. Subtle right sided weakness, but I am afraid she is not offering full effort on muscle testing. Tone and bulk:normal tone throughout; no  atrophy noted Sensory: Pinprick and light touch mildly impaired in the right side. Deep Tendon Reflexes:  Right: Upper Extremity   Left: Upper extremity   biceps (C-5 to C-6) 2/4   biceps (C-5 to C-6) 2/4 tricep (C7) 2/4    triceps (C7) 2/4 Brachioradialis (C6) 2/4  Brachioradialis (C6) 2/4  Lower Extremity Lower Extremity  quadriceps (L-2 to L-4) 2/4   quadriceps (L-2 to L-4) 2/4 Achilles (S1) 2/4   Achilles (S1) 2/4  Plantars: Right: downgoing   Left: downgoing Cerebellar: normal finger-to-nose,  normal heel-to-shin test Gait:  No frank ataxia. CV: pulses palpable throughout    No results found for this basename: cbc, bmp, coags, chol, tri, ldl, hga1c    Results for orders placed during the hospital encounter of 06/27/13 (from the past 48 hour(s))  URINALYSIS, ROUTINE W REFLEX MICROSCOPIC     Status: Abnormal    Collection Time    06/27/13  5:48 PM      Result Value Ref Range   Color, Urine RED (*) YELLOW   Comment: BIOCHEMICALS MAY BE AFFECTED BY COLOR   APPearance CLOUDY (*) CLEAR   Specific Gravity, Urine 1.027  1.005 - 1.030   pH 6.0  5.0 - 8.0   Glucose, UA NEGATIVE  NEGATIVE mg/dL   Hgb urine dipstick LARGE (*) NEGATIVE   Bilirubin Urine NEGATIVE  NEGATIVE   Ketones, ur NEGATIVE  NEGATIVE mg/dL   Protein, ur 30 (*) NEGATIVE mg/dL   Urobilinogen, UA 0.2  0.0 - 1.0 mg/dL   Nitrite NEGATIVE  NEGATIVE   Leukocytes, UA SMALL (*) NEGATIVE  URINE MICROSCOPIC-ADD ON     Status: None   Collection Time    06/27/13  5:48 PM      Result Value Ref Range   WBC, UA 0-2  <3 WBC/hpf   RBC / HPF TOO NUMEROUS TO COUNT  <3 RBC/hpf   Urine-Other FIELD OBSCURED BY RBC'S    LIPASE, BLOOD     Status: None   Collection Time    06/27/13  6:16 PM      Result Value Ref Range   Lipase 34  11 - 59 U/L  COMPREHENSIVE METABOLIC PANEL     Status: Abnormal   Collection Time    06/27/13  6:16 PM      Result Value Ref Range   Sodium 137  137 - 147 mEq/L   Potassium 3.6 (*) 3.7 - 5.3 mEq/L   Chloride 101  96 - 112 mEq/L   CO2 22  19 - 32 mEq/L   Glucose, Bld 74  70 - 99 mg/dL   BUN 8  6 - 23 mg/dL   Creatinine, Ser 0.61  0.50 - 1.10 mg/dL   Calcium 8.6  8.4 - 10.5 mg/dL   Total Protein 7.4  6.0 - 8.3 g/dL   Albumin 3.7  3.5 - 5.2 g/dL   AST 12  0 - 37 U/L   ALT 8  0 - 35 U/L   Alkaline Phosphatase 76  39 - 117 U/L   Total Bilirubin <0.2 (*) 0.3 - 1.2 mg/dL   GFR calc non Af Amer >90  >90 mL/min   GFR calc Af Amer >90  >90 mL/min   Comment: (NOTE)     The eGFR has been calculated using the CKD EPI equation.     This calculation has not been validated in all clinical situations.     eGFR's persistently <90 mL/min signify possible Chronic Kidney  Disease.  CBC WITH DIFFERENTIAL     Status: Abnormal   Collection Time    06/27/13  6:16 PM      Result Value Ref Range   WBC 8.9  4.0 - 10.5 K/uL   RBC  4.45  3.87 - 5.11 MIL/uL   Hemoglobin 8.9 (*) 12.0 - 15.0 g/dL   HCT 29.4 (*) 36.0 - 46.0 %   MCV 66.1 (*) 78.0 - 100.0 fL   MCH 20.0 (*) 26.0 - 34.0 pg   MCHC 30.3  30.0 - 36.0 g/dL   RDW 17.8 (*) 11.5 - 15.5 %   Platelets 359  150 - 400 K/uL   Neutrophils Relative % 63  43 - 77 %   Lymphocytes Relative 27  12 - 46 %   Monocytes Relative 8  3 - 12 %   Eosinophils Relative 2  0 - 5 %   Basophils Relative 0  0 - 1 %   Neutro Abs 5.6  1.7 - 7.7 K/uL   Lymphs Abs 2.4  0.7 - 4.0 K/uL   Monocytes Absolute 0.7  0.1 - 1.0 K/uL   Eosinophils Absolute 0.2  0.0 - 0.7 K/uL   Basophils Absolute 0.0  0.0 - 0.1 K/uL   Smear Review MORPHOLOGY UNREMARKABLE    TROPONIN I     Status: None   Collection Time    06/27/13  6:16 PM      Result Value Ref Range   Troponin I <0.30  <0.30 ng/mL   Comment:            Due to the release kinetics of cTnI,     a negative result within the first hours     of the onset of symptoms does not rule out     myocardial infarction with certainty.     If myocardial infarction is still suspected,     repeat the test at appropriate intervals.  PRO B NATRIURETIC PEPTIDE     Status: None   Collection Time    06/27/13  6:16 PM      Result Value Ref Range   Pro B Natriuretic peptide (BNP) 90.9  0 - 125 pg/mL  TROPONIN I     Status: None   Collection Time    06/27/13  7:23 PM      Result Value Ref Range   Troponin I <0.30  <0.30 ng/mL   Comment:            Due to the release kinetics of cTnI,     a negative result within the first hours     of the onset of symptoms does not rule out     myocardial infarction with certainty.     If myocardial infarction is still suspected,     repeat the test at appropriate intervals.  GLUCOSE, CAPILLARY     Status: None   Collection Time    06/28/13 12:41 AM      Result Value Ref Range   Glucose-Capillary 97  70 - 99 mg/dL    Ct Head Wo Contrast  06/27/2013   CLINICAL DATA:  Right-sided headache. Right arm pain. Slurred  speech.  EXAM: CT HEAD WITHOUT CONTRAST  TECHNIQUE: Contiguous axial images were obtained from the base of the skull through the vertex without intravenous contrast.  COMPARISON:  Head CT 09/27/2012.  FINDINGS: No acute intracranial abnormalities. Specifically, no evidence of acute intracranial hemorrhage, no definite findings of acute/subacute cerebral ischemia, no mass, mass effect,  hydrocephalus or abnormal intra or extra-axial fluid collections. Visualized paranasal sinuses and mastoids are well pneumatized. No acute displaced skull fractures are identified.  IMPRESSION: * No acute intracranial abnormalities. *The appearance of the brain is normal.   Electronically Signed   By: Vinnie Langton M.D.   On: 06/27/2013 18:29   Ct Angio Chest Pe W/cm &/or Wo Cm  06/27/2013   CLINICAL DATA:  Right-sided chest pain, shortness of breath and dizziness. History of lupus and anemia.  EXAM: CT ANGIOGRAPHY CHEST WITH CONTRAST  TECHNIQUE: Multidetector CT imaging of the chest was performed using the standard protocol during bolus administration of intravenous contrast. Multiplanar CT image reconstructions and MIPs were obtained to evaluate the vascular anatomy.  CONTRAST:  157mL OMNIPAQUE IOHEXOL 350 MG/ML SOLN  COMPARISON:  Chest x-ray obtained earlier today; CT abdomen/ pelvis 02/25/2013  FINDINGS: Mediastinum: Unremarkable CT appearance of the thyroid gland. No suspicious mediastinal or hilar adenopathy. No soft tissue mediastinal mass. Mildly patulous thoracic esophagus containing a small volume of debris.  Heart/Vascular: Adequate opacification of the pulmonary arteries to the proximal subsegmental level. Respiratory motion slightly limits evaluation beyond the segmental arteries. No central filling defect to suggest acute pulmonary embolus. Normal caliber main and central pulmonary arteries. No acute aortic abnormality. There is a bovine configuration of the aortic arch (two vessel arch with common origin of the  brachiocephalic and left common carotid arteries), a normal anatomic variant. The heart is within normal limits for size. No pericardial effusion.  Lungs/Pleura: Mild dependent atelectasis in the lower lobes. Otherwise, the lungs are clear. 2 mm nodular opacity affiliated with the minor fissure likely reflects a tiny subpleural lymph node. This is almost certainly benign. No pleural effusion or pneumothorax.  Bones/Soft Tissues: No acute fracture or aggressive appearing lytic or blastic osseous lesion.  Upper Abdomen: Unremarkable imaged upper abdomen.  Review of the MIP images confirms the above findings.  IMPRESSION: 1. Negative for pulmonary embolus, pneumonia or other acute cardiopulmonary process. 2. Mildly patulous esophagus containing layering fluid. Query clinical history of gastroesophageal reflux disease?   Electronically Signed   By: Jacqulynn Cadet M.D.   On: 06/27/2013 20:15   Dg Chest Port 1 View  06/27/2013   CLINICAL DATA:  Chest pain  EXAM: PORTABLE CHEST - 1 VIEW  COMPARISON:  None.  FINDINGS: The lungs are clear and negative for focal airspace consolidation, pulmonary edema or suspicious pulmonary nodule. No pleural effusion or pneumothorax. Cardiac and mediastinal contours are within normal limits. No acute fracture or lytic or blastic osseous lesions. The visualized upper abdominal bowel gas pattern is unremarkable.  IMPRESSION: No active cardiopulmonary disease.   Electronically Signed   By: Jacqulynn Cadet M.D.   On: 06/27/2013 18:08    Assessment/Plan: 43 y/o with history of questionable lupus and anxiety, comes in because of acute onset right sided weakness-numbness, and unsteadiness in conjunction with chest pain. Neuro-exam significant mild right sided weakness and numbness, but I am concerned she is not giving full cooperation on muscle testing. CT/CTA brain negative. Acute cerebrovascular event versus demyelinating disorder but some inconsistencies on neuro-exam. Recommend  MRI brain with and without contrast. PT. Will follow up.  Dorian Pod, MD 06/28/2013, 1:51 AM [facility]

## 2013-06-28 NOTE — Progress Notes (Signed)
PROGRESS NOTE  Danielle Holden RUE:[id number] DOB: Jan 07, 1971 DOA: 06/27/2013 PCP: Provider Not In System  Assessment/Plan: Right sided weakness - undergoing TIA workup. Neuro following.  Chest tightness - troponin negative. 2D echo pending.  Sinus tach  - check TSH, resolved with fluids.  Anemia - iron studies pending. Also check B12 and folate given neuro symptoms.   Diet: regular Fluids: none DVT Prophylaxis: SCD  Code Status: Full Family Communication: none  Disposition Plan: home when ready  Consultants:  Neurology  Procedures:  none   Antibiotics  HPI/Subjective: - no complaints, still weak and with numbness   Objective: Filed Vitals:   06/28/13 0009 06/28/13 0150 06/28/13 0511 06/28/13 0700  BP: 116/62 99/52 95/50    Pulse: 79 70 72   Temp: 99 F (37.2 C) 98.2 F (36.8 C) 98 F (36.7 C) 98.3 F (36.8 C)  TempSrc: Oral Oral Oral   Resp: 16 15 16    Height: 5\' 2"  (1.575 m)     Weight: 62.6 kg (138 lb 0.1 oz)     SpO2: 100% 99% 100%     Intake/Output Summary (Last 24 hours) at 06/28/13 0741 Last data filed at 06/28/13 0700  Gross per 24 hour  Intake 406.67 ml  Output      0 ml  Net 406.67 ml   Filed Weights   06/28/13 0009  Weight: 62.6 kg (138 lb 0.1 oz)    Exam:   General:  NAD  Cardiovascular: regular rate and rhythm, without MRG  Respiratory: good air movement, clear to auscultation throughout, no wheezing, ronchi or rales  Abdomen: soft, not tender to palpation, positive bowel sounds  MSK: no peripheral edema  Neuro: CN 2-12 grossly intact, MS 5/5 in all 4  Data Reviewed: Basic Metabolic Panel:  Recent Labs Lab 06/27/13 1816  NA 137  K 3.6*  CL 101  CO2 22  GLUCOSE 74  BUN 8  CREATININE 0.61  CALCIUM 8.6   Liver Function Tests:  Recent Labs Lab 06/27/13 1816  AST 12  ALT 8  ALKPHOS 76  BILITOT <0.2*  PROT 7.4  ALBUMIN 3.7    Recent Labs Lab 06/27/13 1816  LIPASE 34   No results found for this  basename: AMMONIA,  in the last 168 hours CBC:  Recent Labs Lab 06/27/13 1816  WBC 8.9  NEUTROABS 5.6  HGB 8.9*  HCT 29.4*  MCV 66.1*  PLT 359   Cardiac Enzymes:  Recent Labs Lab 06/27/13 1816 06/27/13 1923 06/28/13 0320  TROPONINI <0.30 <0.30 <0.30   BNP (last 3 results)  Recent Labs  06/27/13 1816  PROBNP 90.9   CBG:  Recent Labs Lab 06/28/13 0041 06/28/13 0719  GLUCAP 97 63*    No results found for this or any previous visit (from the past 240 hour(s)).   Studies: Ct Head Wo Contrast  06/27/2013   CLINICAL DATA:  Right-sided headache. Right arm pain. Slurred speech.  EXAM: CT HEAD WITHOUT CONTRAST  TECHNIQUE: Contiguous axial images were obtained from the base of the skull through the vertex without intravenous contrast.  COMPARISON:  Head CT 09/27/2012.  FINDINGS: No acute intracranial abnormalities. Specifically, no evidence of acute intracranial hemorrhage, no definite findings of acute/subacute cerebral ischemia, no mass, mass effect, hydrocephalus or abnormal intra or extra-axial fluid collections. Visualized paranasal sinuses and mastoids are well pneumatized. No acute displaced skull fractures are identified.  IMPRESSION: * No acute intracranial abnormalities. *The appearance of the brain is normal.   Electronically Signed  By: Trudie Reed M.D.   On: 06/27/2013 18:29   Ct Angio Chest Pe W/cm &/or Wo Cm  06/27/2013   CLINICAL DATA:  Right-sided chest pain, shortness of breath and dizziness. History of lupus and anemia.  EXAM: CT ANGIOGRAPHY CHEST WITH CONTRAST  TECHNIQUE: Multidetector CT imaging of the chest was performed using the standard protocol during bolus administration of intravenous contrast. Multiplanar CT image reconstructions and MIPs were obtained to evaluate the vascular anatomy.  CONTRAST:  OMNIPAQUE IOHEXOL 350 MG/ML SOLN  COMPARISON:  Chest x-ray obtained earlier today; CT abdomen/ pelvis 02/25/2013  FINDINGS: Mediastinum:  Unremarkable CT appearance of the thyroid gland. No suspicious mediastinal or hilar adenopathy. No soft tissue mediastinal mass. Mildly patulous thoracic esophagus containing a small volume of debris.  Heart/Vascular: Adequate opacification of the pulmonary arteries to the proximal subsegmental level. Respiratory motion slightly limits evaluation beyond the segmental arteries. No central filling defect to suggest acute pulmonary embolus. Normal caliber main and central pulmonary arteries. No acute aortic abnormality. There is a bovine configuration of the aortic arch (two vessel arch with common origin of the brachiocephalic and left common carotid arteries), a normal anatomic variant. The heart is within normal limits for size. No pericardial effusion.  Lungs/Pleura: Mild dependent atelectasis in the lower lobes. Otherwise, the lungs are clear. 2 mm nodular opacity affiliated with the minor fissure likely reflects a tiny subpleural lymph node. This is almost certainly benign. No pleural effusion or pneumothorax.  Bones/Soft Tissues: No acute fracture or aggressive appearing lytic or blastic osseous lesion.  Upper Abdomen: Unremarkable imaged upper abdomen.  Review of the MIP images confirms the above findings.  IMPRESSION: 1. Negative for pulmonary embolus, pneumonia or other acute cardiopulmonary process. 2. Mildly patulous esophagus containing layering fluid. Query clinical history of gastroesophageal reflux disease?   Electronically Signed   By: Malachy Moan M.D.   On: 06/27/2013 20:15   Dg Chest Port 1 View  06/27/2013   CLINICAL DATA:  Chest pain  EXAM: PORTABLE CHEST - 1 VIEW  COMPARISON:  None.  FINDINGS: The lungs are clear and negative for focal airspace consolidation, pulmonary edema or suspicious pulmonary nodule. No pleural effusion or pneumothorax. Cardiac and mediastinal contours are within normal limits. No acute fracture or lytic or blastic osseous lesions. The visualized upper abdominal  bowel gas pattern is unremarkable.  IMPRESSION: No active cardiopulmonary disease.   Electronically Signed   By: Malachy Moan M.D.   On: 06/27/2013 18:08    Scheduled Meds: . amoxicillin-clavulanate  1 tablet Oral BID  . aspirin  325 mg Oral Daily  . ferrous sulfate  325 mg Oral QPC breakfast  . fluticasone  2 spray Each Nare Daily  . vitamin B-12  1,000 mcg Oral Daily   Continuous Infusions: . sodium chloride 100 mL/hr at 06/28/13 0700    Principal Problem:   Acute right-sided weakness Active Problems:   TIA (transient ischemic attack)   Anemia   Menorrhagia   Chest pain   Time spent: 35  This note has been created with Education officer, environmental. Any transcriptional errors are unintentional.   Pamella Pert, MD [facility] Pager [phone]. If 7 PM - 7 AM, please contact night-coverage at [email or web address], password [facility] 06/28/2013, 7:41 AM  LOS: 1 day

## 2013-06-28 NOTE — Progress Notes (Signed)
Utilization review completed.  

## 2013-06-28 NOTE — Progress Notes (Signed)
Pt passed Stroke Swallow Screen without difficulty.  No signs of aspiration after eating/drinking.  Maintaining airway and lung sounds clear pre and post screening.

## 2013-06-29 LAB — CBC
HCT: 26.2 % — ABNORMAL LOW (ref 36.0–46.0)
Hemoglobin: 7.8 g/dL — ABNORMAL LOW (ref 12.0–15.0)
MCH: 19.8 pg — ABNORMAL LOW (ref 26.0–34.0)
MCHC: 29.8 g/dL — AB (ref 30.0–36.0)
MCV: 66.5 fL — ABNORMAL LOW (ref 78.0–100.0)
PLATELETS: 299 10*3/uL (ref 150–400)
RBC: 3.94 MIL/uL (ref 3.87–5.11)
RDW: 18 % — ABNORMAL HIGH (ref 11.5–15.5)
WBC: 7.2 10*3/uL (ref 4.0–10.5)

## 2013-06-29 LAB — URINALYSIS, ROUTINE W REFLEX MICROSCOPIC
Bilirubin Urine: NEGATIVE
GLUCOSE, UA: NEGATIVE mg/dL
Ketones, ur: NEGATIVE mg/dL
LEUKOCYTES UA: NEGATIVE
Nitrite: NEGATIVE
PROTEIN: NEGATIVE mg/dL
SPECIFIC GRAVITY, URINE: 1.009 (ref 1.005–1.030)
Urobilinogen, UA: 0.2 mg/dL (ref 0.0–1.0)
pH: 6.5 (ref 5.0–8.0)

## 2013-06-29 LAB — URINE MICROSCOPIC-ADD ON

## 2013-06-29 LAB — GLUCOSE, CAPILLARY
GLUCOSE-CAPILLARY: 101 mg/dL — AB (ref 70–99)
Glucose-Capillary: 86 mg/dL (ref 70–99)
Glucose-Capillary: 94 mg/dL (ref 70–99)
Glucose-Capillary: 105 mg/dL — ABNORMAL HIGH (ref 70–99)

## 2013-06-29 LAB — C-REACTIVE PROTEIN: CRP: <0.5 mg/dL — ABNORMAL LOW (ref <0.60)

## 2013-06-29 LAB — SEDIMENTATION RATE: Sed Rate: 20 mm/hr (ref 0–22)

## 2013-06-29 MED ORDER — HYDROCODONE-ACETAMINOPHEN 5-325 MG PO TABS
1.0000 | ORAL_TABLET | Freq: Four times a day (QID) | ORAL | Status: DC | PRN
  Administered 2013-06-29 – 2013-06-30 (×2): 1 via ORAL
  Filled 2013-06-29 (×2): qty 1

## 2013-06-29 NOTE — Progress Notes (Signed)
PROGRESS NOTE  Danielle Holden:[id number] DOB: [birth year] DOA: 06/27/2013 PCP: Provider Not In System  Assessment/Plan: Right sided weakness/numbness - MRI negative - still with persistent symptoms, I am not clear about etiology, ?further workup. Appreciate neuro follow up.  Chest tightness - troponin negative. 2D echo without significant findings.  Sinus tach  - TSH normal Anemia - iron deficient Borderline low cortisol - stim test this morning.  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 Awaiting results.  Diet: regular Fluids: none DVT Prophylaxis: SCD  Code Status: Full Family Communication: none  Disposition Plan: home when ready  Consultants:  Neurology  Procedures:  none   Antibiotics  HPI/Subjective: - no complaints, still weak and with numbness   Objective: Filed Vitals:   06/29/13 0200 06/29/13 0534 06/29/13 1235 06/29/13 1417  BP: 94/55 110/68 109/64 103/55  Pulse: 70 72 88 86  Temp: 98.1 F (36.7 C) 97.8 F (36.6 C) 98.1 F (36.7 C) 98.1 F (36.7 C)  TempSrc: Oral Oral Oral Oral  Resp: 16 16 16 16   Height:      Weight:      SpO2: 99% 100% 100% 100%    Intake/Output Summary (Last 24 hours) at 06/29/13 1639 Last data filed at 06/29/13 1418  Gross per 24 hour  Intake 2211.67 ml  Output   3875 ml  Net -1663.33 ml   Filed Weights   06/28/13 0009  Weight: 62.6 kg (138 lb 0.1 oz)   Exam:  General:  NAD  Cardiovascular: regular rate and rhythm, without MRG  Respiratory: good air movement, clear to auscultation throughout, no wheezing, ronchi or rales  Abdomen: soft, not tender to palpation, positive bowel sounds  MSK: no peripheral edema  Neuro: non focal  Data Reviewed: Basic Metabolic Panel:  Recent Labs Lab 06/27/13 1816 06/28/13 0901  NA 137  --   K 3.6*  --   CL 101  --   CO2 22  --   GLUCOSE 74  --   BUN 8  --   CREATININE 0.61  --   CALCIUM 8.6  --   MG  --  2.0   Liver Function  Tests:  Recent Labs Lab 06/27/13 1816  AST 12  ALT 8  ALKPHOS 76  BILITOT <0.2*  PROT 7.4  ALBUMIN 3.7    Recent Labs Lab 06/27/13 1816  LIPASE 34   CBC:  Recent Labs Lab 06/27/13 1816 06/28/13 0320 06/29/13 0402  WBC 8.9 10.3 7.2  NEUTROABS 5.6  --   --   HGB 8.9* 7.4* 7.8*  HCT 29.4* 25.1* 26.2*  MCV 66.1* 66.9* 66.5*  PLT 359 290 299   Cardiac Enzymes:  Recent Labs Lab 06/27/13 1816 06/27/13 1923 06/28/13 0320  TROPONINI <0.30 <0.30 <0.30   BNP (last 3 results)  Recent Labs  06/27/13 1816  PROBNP 90.9   CBG:  Recent Labs Lab 06/28/13 1220 06/28/13 1741 06/28/13 2205 06/29/13 0744 06/29/13 1233  GLUCAP 115* 94 83 86 94   Studies: Ct Head Wo Contrast  06/27/2013   CLINICAL DATA:  Right-sided headache. Right arm pain. Slurred speech.  EXAM: CT HEAD WITHOUT CONTRAST  TECHNIQUE: Contiguous axial images were obtained from the base of the skull through the vertex without intravenous contrast.  COMPARISON:  Head CT 09/27/2012.  FINDINGS: No acute intracranial abnormalities. Specifically, no evidence of acute intracranial hemorrhage, no definite findings of acute/subacute cerebral ischemia, no mass, mass effect, hydrocephalus or abnormal intra or extra-axial fluid collections. Visualized paranasal  sinuses and mastoids are well pneumatized. No acute displaced skull fractures are identified.  IMPRESSION: * No acute intracranial abnormalities. *The appearance of the brain is normal.   Electronically Signed   By: Trudie Reed M.D.   On: 06/27/2013 18:29   Ct Angio Chest Pe W/cm &/or Wo Cm  06/27/2013   CLINICAL DATA:  Right-sided chest pain, shortness of breath and dizziness. History of lupus and anemia.  EXAM: CT ANGIOGRAPHY CHEST WITH CONTRAST  TECHNIQUE: Multidetector CT imaging of the chest was performed using the standard protocol during bolus administration of intravenous contrast. Multiplanar CT image reconstructions and MIPs were obtained to evaluate  the vascular anatomy.  CONTRAST:  OMNIPAQUE IOHEXOL 350 MG/ML SOLN  COMPARISON:  Chest x-ray obtained earlier today; CT abdomen/ pelvis 02/25/2013  FINDINGS: Mediastinum: Unremarkable CT appearance of the thyroid gland. No suspicious mediastinal or hilar adenopathy. No soft tissue mediastinal mass. Mildly patulous thoracic esophagus containing a small volume of debris.  Heart/Vascular: Adequate opacification of the pulmonary arteries to the proximal subsegmental level. Respiratory motion slightly limits evaluation beyond the segmental arteries. No central filling defect to suggest acute pulmonary embolus. Normal caliber main and central pulmonary arteries. No acute aortic abnormality. There is a bovine configuration of the aortic arch (two vessel arch with common origin of the brachiocephalic and left common carotid arteries), a normal anatomic variant. The heart is within normal limits for size. No pericardial effusion.  Lungs/Pleura: Mild dependent atelectasis in the lower lobes. Otherwise, the lungs are clear. 2 mm nodular opacity affiliated with the minor fissure likely reflects a tiny subpleural lymph node. This is almost certainly benign. No pleural effusion or pneumothorax.  Bones/Soft Tissues: No acute fracture or aggressive appearing lytic or blastic osseous lesion.  Upper Abdomen: Unremarkable imaged upper abdomen.  Review of the MIP images confirms the above findings.  IMPRESSION: 1. Negative for pulmonary embolus, pneumonia or other acute cardiopulmonary process. 2. Mildly patulous esophagus containing layering fluid. Query clinical history of gastroesophageal reflux disease?   Electronically Signed   By: Malachy Moan M.D.   On: 06/27/2013 20:15   Mr Danielle Holden WU Contrast  06/28/2013   CLINICAL DATA:  Weakness and paresthesias in the right arm and leg with unsteadiness. Evaluate for ischemia versus demyelinating disease.  EXAM: MRI HEAD WITHOUT AND WITH CONTRAST  MRA HEAD WITHOUT CONTRAST   TECHNIQUE: Multiplanar, multiecho pulse sequences of the brain and surrounding structures were obtained without and with intravenous contrast. Angiographic images of the head were obtained using MRA technique without contrast.  CONTRAST:  60mL MULTIHANCE GADOBENATE DIMEGLUMINE 529 MG/ML IV SOLN  COMPARISON:  CT HEAD W/O CM dated 06/27/2013; CT HEAD W/O CM dated 09/27/2012  FINDINGS: MRI HEAD FINDINGS  No evidence for acute infarction, hemorrhage, mass lesion, hydrocephalus, or extra-axial fluid. Normal cerebral volume. No white matter disease. No foci of chronic hemorrhage. Flow voids are maintained throughout the carotid, basilar, and vertebral arteries. There are no areas of chronic hemorrhage. Pituitary, pineal, and cerebellar tonsils unremarkable. No upper cervical lesions. Post infusion, no abnormal enhancement of the brain or meninges. Visualized calvarium, skull base, and upper cervical osseous structures unremarkable. Scalp and extracranial soft tissues, orbits, sinuses, and mastoids show no acute process.  MRA HEAD FINDINGS  The internal carotid arteries are widely patent. The basilar artery is widely patent. Vertebrals are codominant. There is no intracranial stenosis or aneurysm.  IMPRESSION: Negative MRI of the brain. No acute or focal intracranial abnormality is seen.  Unremarkable MRA of  the intracranial circulation.   Electronically Signed   By: Davonna Belling M.D.   On: 06/28/2013 12:09   Dg Chest Port 1 View  06/27/2013   CLINICAL DATA:  Chest pain  EXAM: PORTABLE CHEST - 1 VIEW  COMPARISON:  None.  FINDINGS: The lungs are clear and negative for focal airspace consolidation, pulmonary edema or suspicious pulmonary nodule. No pleural effusion or pneumothorax. Cardiac and mediastinal contours are within normal limits. No acute fracture or lytic or blastic osseous lesions. The visualized upper abdominal bowel gas pattern is unremarkable.  IMPRESSION: No active cardiopulmonary disease.   Electronically  Signed   By: Malachy Moan M.D.   On: 06/27/2013 18:08   Mr Maxine Glenn Head/brain Wo Cm  06/28/2013   CLINICAL DATA:  Weakness and paresthesias in the right arm and leg with unsteadiness. Evaluate for ischemia versus demyelinating disease.  EXAM: MRI HEAD WITHOUT AND WITH CONTRAST  MRA HEAD WITHOUT CONTRAST  TECHNIQUE: Multiplanar, multiecho pulse sequences of the brain and surrounding structures were obtained without and with intravenous contrast. Angiographic images of the head were obtained using MRA technique without contrast.  CONTRAST:  28mL MULTIHANCE GADOBENATE DIMEGLUMINE 529 MG/ML IV SOLN  COMPARISON:  CT HEAD W/O CM dated 06/27/2013; CT HEAD W/O CM dated 09/27/2012  FINDINGS: MRI HEAD FINDINGS  No evidence for acute infarction, hemorrhage, mass lesion, hydrocephalus, or extra-axial fluid. Normal cerebral volume. No white matter disease. No foci of chronic hemorrhage. Flow voids are maintained throughout the carotid, basilar, and vertebral arteries. There are no areas of chronic hemorrhage. Pituitary, pineal, and cerebellar tonsils unremarkable. No upper cervical lesions. Post infusion, no abnormal enhancement of the brain or meninges. Visualized calvarium, skull base, and upper cervical osseous structures unremarkable. Scalp and extracranial soft tissues, orbits, sinuses, and mastoids show no acute process.  MRA HEAD FINDINGS  The internal carotid arteries are widely patent. The basilar artery is widely patent. Vertebrals are codominant. There is no intracranial stenosis or aneurysm.  IMPRESSION: Negative MRI of the brain. No acute or focal intracranial abnormality is seen.  Unremarkable MRA of the intracranial circulation.   Electronically Signed   By: Davonna Belling M.D.   On: 06/28/2013 12:09    Scheduled Meds: . aspirin  325 mg Oral Daily  . ferrous sulfate  325 mg Oral TID WC  . fluticasone  2 spray Each Nare Daily  . vitamin B-12  1,000 mcg Oral Daily   Continuous Infusions: . sodium chloride  100 mL/hr at 06/29/13 1028    Principal Problem:   Acute right-sided weakness Active Problems:   TIA (transient ischemic attack)   Anemia   Menorrhagia   Chest pain  Time spent: 25  This note has been created with Education officer, environmental. Any transcriptional errors are unintentional.   Pamella Pert, MD [facility] Pager [phone]. If 7 PM - 7 AM, please contact night-coverage at [email or web address], password [facility] 06/29/2013, 4:39 PM  LOS: 2 days

## 2013-06-29 NOTE — Evaluation (Signed)
Occupational Therapy Evaluation Patient Details Name: Danielle Holden MRN: [id number] DOB: 04-09-1971 Today's Date: 06/29/2013 Time: 0865-7846 OT Time Calculation (min): 27 min  OT Assessment / Plan / Recommendation History of present illness Pt. admitted for TIA. Pt. medical hx for OA, anxiety, questionabe lupus.    Clinical Impression   Pt. Is having continued numbness in R rand but is able to use functionally. Pt. Is at baseline for ADLs and mobiltiy in room. Pt. States she still feels off balance and would benefit from PT eval to assess. Pt. Does not need further acute OT services.     OT Assessment  Patient does not need any further OT services    Follow Up Recommendations  No OT follow up    Barriers to Discharge      Equipment Recommendations  None recommended by OT    Recommendations for Other Services    Frequency       Precautions / Restrictions Restrictions Weight Bearing Restrictions: No   Pertinent Vitals/Pain No d/o    ADL  Eating/Feeding: Simulated;Independent Where Assessed - Eating/Feeding: Edge of bed Grooming: Performed;Wash/dry hands;Wash/dry face;Brushing hair Where Assessed - Grooming: Unsupported standing Upper Body Bathing: Simulated;Modified independent Where Assessed - Upper Body Bathing: Unsupported sitting Lower Body Bathing: Simulated;Modified independent Where Assessed - Lower Body Bathing: Unsupported sitting;Unsupported standing Upper Body Dressing: Performed;Modified independent Where Assessed - Upper Body Dressing: Unsupported sitting Lower Body Dressing: Performed;Minimal assistance;Modified independent Where Assessed - Lower Body Dressing: Unsupported sitting;Unsupported standing Toilet Transfer: Modified independent Toilet Transfer Method: Surveyor, minerals: Comfort height toilet Toileting - Architect and Hygiene: Modified independent Where Assessed - Engineer, mining and Hygiene:  Standing ADL Comments:  (Pt. at baseline for ADLs, has R hand numbness)    OT Diagnosis:    OT Problem List:   OT Treatment Interventions:     OT Goals(Current goals can be found in the care plan section)    Visit Information  Last OT Received On: 06/29/13 Assistance Needed:  (none) History of Present Illness: Pt. admitted for TIA. Pt. medical hx for OA, anxiety, questionabe lupus.        Prior Functioning     Home Living Family/patient expects to be discharged to:: Private residence Living Arrangements: Spouse/significant other Type of Home: Apartment Home Access: Stairs to enter [employer] of Steps:  (12-14) Entrance Stairs-Rails:  (on both sides) Home Layout:  (lives on second floor apartment. ) Prior Function Level of Independence: Independent Communication Communication: No difficulties Dominant Hand: Right         Vision/Perception Vision - History Baseline Vision: No visual deficits Patient Visual Report: No change from baseline   Cognition  Cognition Arousal/Alertness: Awake/alert Behavior During Therapy: WFL for tasks assessed/performed Overall Cognitive Status: Within Functional Limits for tasks assessed    Extremity/Trunk Assessment Upper Extremity Assessment Upper Extremity Assessment: Overall WFL for tasks assessed     Mobility       Exercise     Balance     End of Session OT - End of Session Activity Tolerance: Patient tolerated treatment well Patient left: in bed  GO     Danielle Holden 06/29/2013, 7:39 AM

## 2013-06-29 NOTE — Evaluation (Signed)
Physical Therapy Evaluation Patient Details Name: Danielle Holden MRN: [id number] DOB: [birth year] Today's Date: 06/29/2013 Time: 4315-4008 PT Time Calculation (min): 33 min  PT Assessment / Plan / Recommendation History of Present Illness  Pt. admitted for TIA. Pt. medical hx for OA, anxiety, questionabe lupus.   Clinical Impression  Pt presents with decreased fine motor control of R LE. Pt reports a pain in her R flank/hip and down R thigh. Pt iis noted to have a tremor of R leg greater than L during gait and balance testing. Pt resides on second level apt. Will need to assess safety for stairs. Pt could benefit from Neuro rehab OP PT but not sure Insurance will cover. Pt will benefit from PT to address problems listed.    PT Assessment  Patient needs continued PT services    Follow Up Recommendations  Outpatient PT;Supervision/Assistance - 24 hour (unfortunately, pt has Medicaid)    Does the patient have the potential to tolerate intense rehabilitation      Barriers to Discharge Decreased caregiver support      Equipment Recommendations  None recommended by PT    Recommendations for Other Services     Frequency Min 4X/week    Precautions / Restrictions Precautions Precautions: Fall Restrictions Weight Bearing Restrictions: No   Pertinent Vitals/Pain Reports pain in R hip  BP 116/62 after ambulating      Mobility  Bed Mobility Overal bed mobility: Independent Transfers Overall transfer level: Independent Ambulation/Gait Ambulation/Gait assistance: Min guard Ambulation Distance (Feet): 400 Feet Gait Pattern/deviations: Step-through pattern;Decreased weight shift to right;Decreased dorsiflexion - right Gait velocity interpretation: Below normal speed for age/gender General Gait Details: R foot never wen foot flat during stance phase, Great toe remained in extension at foot flat stage    Exercises     PT Diagnosis: Abnormality of gait  PT Problem List: Decreased  strength;Impaired sensation;Decreased balance;Decreased mobility PT Treatment Interventions: Gait training;Stair training;Functional mobility training;Therapeutic exercise;Balance training     PT Goals(Current goals can be found in the care plan section) Acute Rehab PT Goals Patient Stated Goal: I want to know what is wrong PT Goal Formulation: With patient Time For Goal Achievement: 07/13/13 Potential to Achieve Goals: Good  Visit Information  Last PT Received On: 06/29/13 Assistance Needed: +1 History of Present Illness: Pt. admitted for TIA. Pt. medical hx for OA, anxiety, questionabe lupus.        Prior Functioning  Home Living Family/patient expects to be discharged to:: Private residence Living Arrangements: Alone Type of Home: Apartment Home Access: Stairs to enter [employer] of Steps: 14 Entrance Stairs-Rails: Right;Left Home Layout: One level Home Equipment: None Prior Function Level of Independence: Independent Communication Communication: No difficulties Dominant Hand: Right    Cognition  Cognition Arousal/Alertness: Awake/alert Behavior During Therapy: WFL for tasks assessed/performed Overall Cognitive Status: Within Functional Limits for tasks assessed    Extremity/Trunk Assessment Upper Extremity Assessment Upper Extremity Assessment: Defer to OT evaluation Lower Extremity Assessment Lower Extremity Assessment: RLE deficits/detail;LLE deficits/detail RLE Deficits / Details: , sensory test revealed inconsistencies in foot and leg in that pt DID idenetify light touch when only R leg tested but did NOT identify when both legs touched.  Heel shin slow on R VS. L, RRM/RAM of R foot presents with the trors during movement. 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   End of Session PT - End of Session Equipment Utilized During Treatment: Gait belt Activity Tolerance: Patient tolerated treatment well Patient left: in bed;with call bell/phone within reach  GP Functional Assessment Tool Used: clinical judgement. Functional Limitation: Mobility: Walking and moving around Mobility: Walking and Moving Around Current Status [phone]): At least 1 percent but less than 20 percent impaired, limited or restricted Mobility: Walking and Moving Around Goal Status [phone]): 0 percent impaired, limited or restricted   Rada Hay 06/29/2013, 11:29 AM Blanchard Kelch PT [phone]

## 2013-06-29 NOTE — Progress Notes (Signed)
Patient having bloody urine notified Dr. Elvera Lennox notified, ordered UA. Will continue to assess patient.

## 2013-06-29 NOTE — Progress Notes (Signed)
Subjective: Patient continues to have complaints of right sided weakness.  Work up has been unremarkable.  Complains of headache as well.    Objective: Current vital signs: BP 103/55  Pulse 86  Temp(Src) 98.1 F (36.7 C) (Oral)  Resp 16  Ht 5\' 2"  (1.575 m)  Wt 62.6 kg (138 lb 0.1 oz)  BMI 25.24 kg/m2  SpO2 100%  LMP 06/27/2013 Vital signs in last 24 hours: Temp:  [97.5 F (36.4 C)-98.1 F (36.7 C)] 98.1 F (36.7 C) (02/16 1417) Pulse Rate:  [70-88] 86 (02/16 1417) Resp:  [16] 16 (02/16 1417) BP: (94-110)/(55-68) 103/55 mmHg (02/16 1417) SpO2:  [99 %-100 %] 100 % (02/16 1417)  Intake/Output from previous day: 02/15 0701 - 02/16 0700 In: 2493.3 [P.O.:360; I.V.:2133.3] Out: 3750 [Urine:3750] Intake/Output this shift:   Nutritional status: Cardiac  Neurologic Exam: Mental Status: Alert, oriented, thought content appropriate.  Speech fluent without evidence of aphasia.  Able to follow 3 step commands without difficulty. Cranial Nerves: II: Discs flat bilaterally; Visual fields grossly normal, pupils equal, round, reactive to light and accommodation III,IV, VI: ptosis not present, extra-ocular motions intact bilaterally V,VII: smile symmetric, facial light touch sensation decreased on the right splitting the midline VIII: hearing normal bilaterally IX,X: gag reflex present XI: bilateral shoulder shrug XII: midline tongue extension Motor: Patient gives decreased effort on the right.  When pronator drift testing performed patient has drift without pronation.  Able to hold both legs off the bed together with no RLE drift.  When tested individually patient gives significant weakness on the right.   Sensory: Pinprick and light touch decreased on the right Deep Tendon Reflexes: 2+ and symmetric throughout Plantars: Right: downgoing   Left: downgoing Cerebellar: normal finger-to-nose  and normal heel-to-shin test  Lab Results: Basic Metabolic Panel:  Recent Labs Lab  06/27/13 1816 06/28/13 0901  NA 137  --   K 3.6*  --   CL 101  --   CO2 22  --   GLUCOSE 74  --   BUN 8  --   CREATININE 0.61  --   CALCIUM 8.6  --   MG  --  2.0    Liver Function Tests:  Recent Labs Lab 06/27/13 1816  AST 12  ALT 8  ALKPHOS 76  BILITOT <0.2*  PROT 7.4  ALBUMIN 3.7    Recent Labs Lab 06/27/13 1816  LIPASE 34   No results found for this basename: AMMONIA,  in the last 168 hours  CBC:  Recent Labs Lab 06/27/13 1816 06/28/13 0320 06/29/13 0402  WBC 8.9 10.3 7.2  NEUTROABS 5.6  --   --   HGB 8.9* 7.4* 7.8*  HCT 29.4* 25.1* 26.2*  MCV 66.1* 66.9* 66.5*  PLT 359 290 299    Cardiac Enzymes:  Recent Labs Lab 06/27/13 1816 06/27/13 1923 06/28/13 0320  TROPONINI <0.30 <0.30 <0.30    Lipid Panel:  Recent Labs Lab 06/28/13 0320  CHOL 125  TRIG 34  HDL 61  CHOLHDL 2.0  VLDL 7  LDLCALC 57    CBG:  Recent Labs Lab 06/28/13 1741 06/28/13 2205 06/29/13 0744 06/29/13 1233 06/29/13 1708  GLUCAP 94 83 86 94 105*    Microbiology: No results found for this or any previous visit.  Coagulation Studies: No results found for this basename: LABPROT, INR,  in the last 72 hours  Imaging: Ct Angio Chest Pe W/cm &/or Wo Cm  06/27/2013   CLINICAL DATA:  Right-sided chest pain, shortness of breath  and dizziness. History of lupus and anemia.  EXAM: CT ANGIOGRAPHY CHEST WITH CONTRAST  TECHNIQUE: Multidetector CT imaging of the chest was performed using the standard protocol during bolus administration of intravenous contrast. Multiplanar CT image reconstructions and MIPs were obtained to evaluate the vascular anatomy.  CONTRAST:  OMNIPAQUE IOHEXOL 350 MG/ML SOLN  COMPARISON:  Chest x-ray obtained earlier today; CT abdomen/ pelvis 02/25/2013  FINDINGS: Mediastinum: Unremarkable CT appearance of the thyroid gland. No suspicious mediastinal or hilar adenopathy. No soft tissue mediastinal mass. Mildly patulous thoracic esophagus containing a  small volume of debris.  Heart/Vascular: Adequate opacification of the pulmonary arteries to the proximal subsegmental level. Respiratory motion slightly limits evaluation beyond the segmental arteries. No central filling defect to suggest acute pulmonary embolus. Normal caliber main and central pulmonary arteries. No acute aortic abnormality. There is a bovine configuration of the aortic arch (two vessel arch with common origin of the brachiocephalic and left common carotid arteries), a normal anatomic variant. The heart is within normal limits for size. No pericardial effusion.  Lungs/Pleura: Mild dependent atelectasis in the lower lobes. Otherwise, the lungs are clear. 2 mm nodular opacity affiliated with the minor fissure likely reflects a tiny subpleural lymph node. This is almost certainly benign. No pleural effusion or pneumothorax.  Bones/Soft Tissues: No acute fracture or aggressive appearing lytic or blastic osseous lesion.  Upper Abdomen: Unremarkable imaged upper abdomen.  Review of the MIP images confirms the above findings.  IMPRESSION: 1. Negative for pulmonary embolus, pneumonia or other acute cardiopulmonary process. 2. Mildly patulous esophagus containing layering fluid. Query clinical history of gastroesophageal reflux disease?   Electronically Signed   By: Malachy Moan M.D.   On: 06/27/2013 20:15   Mr [name] JG Contrast  06/28/2013   CLINICAL DATA:  Weakness and paresthesias in the right arm and leg with unsteadiness. Evaluate for ischemia versus demyelinating disease.  EXAM: MRI HEAD WITHOUT AND WITH CONTRAST  MRA HEAD WITHOUT CONTRAST  TECHNIQUE: Multiplanar, multiecho pulse sequences of the brain and surrounding structures were obtained without and with intravenous contrast. Angiographic images of the head were obtained using MRA technique without contrast.  CONTRAST:  4mL MULTIHANCE GADOBENATE DIMEGLUMINE 529 MG/ML IV SOLN  COMPARISON:  CT HEAD W/O CM dated 06/27/2013; CT HEAD W/O CM  dated 09/27/2012  FINDINGS: MRI HEAD FINDINGS  No evidence for acute infarction, hemorrhage, mass lesion, hydrocephalus, or extra-axial fluid. Normal cerebral volume. No white matter disease. No foci of chronic hemorrhage. Flow voids are maintained throughout the carotid, basilar, and vertebral arteries. There are no areas of chronic hemorrhage. Pituitary, pineal, and cerebellar tonsils unremarkable. No upper cervical lesions. Post infusion, no abnormal enhancement of the brain or meninges. Visualized calvarium, skull base, and upper cervical osseous structures unremarkable. Scalp and extracranial soft tissues, orbits, sinuses, and mastoids show no acute process.  MRA HEAD FINDINGS  The internal carotid arteries are widely patent. The basilar artery is widely patent. Vertebrals are codominant. There is no intracranial stenosis or aneurysm.  IMPRESSION: Negative MRI of the brain. No acute or focal intracranial abnormality is seen.  Unremarkable MRA of the intracranial circulation.   Electronically Signed   By: Davonna Belling M.D.   On: 06/28/2013 12:09   Mr Maxine Glenn Head/brain Wo Cm  06/28/2013   CLINICAL DATA:  Weakness and paresthesias in the right arm and leg with unsteadiness. Evaluate for ischemia versus demyelinating disease.  EXAM: MRI HEAD WITHOUT AND WITH CONTRAST  MRA HEAD WITHOUT CONTRAST  TECHNIQUE: Multiplanar,  multiecho pulse sequences of the brain and surrounding structures were obtained without and with intravenous contrast. Angiographic images of the head were obtained using MRA technique without contrast.  CONTRAST:  39mL MULTIHANCE GADOBENATE DIMEGLUMINE 529 MG/ML IV SOLN  COMPARISON:  CT HEAD W/O CM dated 06/27/2013; CT HEAD W/O CM dated 09/27/2012  FINDINGS: MRI HEAD FINDINGS  No evidence for acute infarction, hemorrhage, mass lesion, hydrocephalus, or extra-axial fluid. Normal cerebral volume. No white matter disease. No foci of chronic hemorrhage. Flow voids are maintained throughout the carotid,  basilar, and vertebral arteries. There are no areas of chronic hemorrhage. Pituitary, pineal, and cerebellar tonsils unremarkable. No upper cervical lesions. Post infusion, no abnormal enhancement of the brain or meninges. Visualized calvarium, skull base, and upper cervical osseous structures unremarkable. Scalp and extracranial soft tissues, orbits, sinuses, and mastoids show no acute process.  MRA HEAD FINDINGS  The internal carotid arteries are widely patent. The basilar artery is widely patent. Vertebrals are codominant. There is no intracranial stenosis or aneurysm.  IMPRESSION: Negative MRI of the brain. No acute or focal intracranial abnormality is seen.  Unremarkable MRA of the intracranial circulation.   Electronically Signed   By: Davonna Belling M.D.   On: 06/28/2013 12:09    Medications:  I have reviewed the patient's current medications. Scheduled: . aspirin  325 mg Oral Daily  . ferrous sulfate  325 mg Oral TID WC  . fluticasone  2 spray Each Nare Daily  . vitamin B-12  1,000 mcg Oral Daily    Assessment/Plan: 43 year old female with complaints of right sided numbness and weakness.  Neurological examination has many functional features.  Stroke work up has been unremarkable.  Echocardiogram and carotid doppler show no significant abnormalities.  MRI of the brain reviewed and is unremarkable.  MRA is unremarkable as well.   It is very unlikely that this patient has had an ischemic event or any other neurologic insult.  She is on an aspirin a day.  In the event that she has mild symptoms underlying her functional component that have resulted in an MR negative evaluation, aspirin would be appropriate prophylaxis.    Recommendations: 1.  Patient may remain on an aspirin a day.  Would place on 81mg  daily. 2.  No further neurologic intervention is recommended at this time.  If further questions arise, please call or page at that time.  Thank you for allowing neurology to participate in the  care of this patient.   LOS: 2 days   , MD [facility] [phone] 06/29/2013  7:58 PM

## 2013-06-30 DIAGNOSIS — D509 Iron deficiency anemia, unspecified: Secondary | ICD-10-CM | POA: Diagnosis present

## 2013-06-30 LAB — CBC
HCT: 27.1 % — ABNORMAL LOW (ref 36.0–46.0)
HEMOGLOBIN: 7.8 g/dL — AB (ref 12.0–15.0)
MCH: 19.3 pg — ABNORMAL LOW (ref 26.0–34.0)
MCHC: 28.8 g/dL — ABNORMAL LOW (ref 30.0–36.0)
MCV: 67.1 fL — ABNORMAL LOW (ref 78.0–100.0)
PLATELETS: 300 10*3/uL (ref 150–400)
RBC: 4.04 MIL/uL (ref 3.87–5.11)
RDW: 17.9 % — ABNORMAL HIGH (ref 11.5–15.5)
WBC: 9.6 10*3/uL (ref 4.0–10.5)

## 2013-06-30 LAB — ACTH STIMULATION, 3 TIME POINTS
CORTISOL 30 MIN: 23.7 ug/dL (ref >20.0)
Cortisol, 60 Min: 24.2 ug/dL (ref >20)
Cortisol, Base: 14.3 ug/dL

## 2013-06-30 LAB — GLUCOSE, CAPILLARY: Glucose-Capillary: 74 mg/dL (ref 70–99)

## 2013-06-30 LAB — ANTI-DNA ANTIBODY, DOUBLE-STRANDED: ds DNA Ab: <1 IU/mL

## 2013-06-30 MED ORDER — KETOROLAC TROMETHAMINE 15 MG/ML IJ SOLN
30.0000 mg | Freq: Once | INTRAMUSCULAR | Status: AC
  Administered 2013-06-30: 30 mg via INTRAVENOUS
  Filled 2013-06-30: qty 2

## 2013-06-30 MED ORDER — METOCLOPRAMIDE HCL 5 MG/ML IJ SOLN
10.0000 mg | Freq: Once | INTRAMUSCULAR | Status: AC
  Administered 2013-06-30: 10 mg via INTRAVENOUS
  Filled 2013-06-30: qty 2

## 2013-06-30 MED ORDER — DIPHENHYDRAMINE HCL 50 MG/ML IJ SOLN
25.0000 mg | Freq: Once | INTRAMUSCULAR | Status: AC
  Administered 2013-06-30: 25 mg via INTRAVENOUS
  Filled 2013-06-30: qty 1

## 2013-06-30 MED ORDER — FERROUS SULFATE 325 (65 FE) MG PO TABS: 325.0000 mg | ORAL_TABLET | Freq: Three times a day (TID) | ORAL | Status: DC

## 2013-06-30 MED ORDER — ASPIRIN EC 81 MG PO TBEC: 81.0000 mg | DELAYED_RELEASE_TABLET | Freq: Every day | ORAL | Status: DC

## 2013-06-30 NOTE — Discharge Summary (Signed)
Physician Discharge Summary  Danielle GeorgeMarie A Cannaday ZOX:[id number] DOB: 10/15/1970 DOA: 06/27/2013  PCP: Provider Not In System  Admit date: 06/27/2013 Discharge date: 06/30/2013  Time spent: 35 minutes  Recommendations for Outpatient Follow-up:  1. Follow up with PCP in 1-2 weeks   Discharge Diagnoses:  Principal Problem:   Acute right-sided weakness Active Problems:   TIA (transient ischemic attack)   Anemia   Menorrhagia   Chest pain  Discharge Condition: stable  Diet recommendation: regular  Filed Weights   06/28/13 0009  Weight: 62.6 kg (138 lb 0.1 oz)   History of present illness:  Danielle Holden is a 43 y.o. female with Past medical history of anemia anxiety questionable lupus. The patient is coming from home  The patient presented with complaints of chest tightness and right-sided weakness that started around 7 AM. She woke up from her sleep with the symptoms.  She has chest tightness which was on and off occurring throughout the day resolving on its own located bilaterally associated with shortness of breath and right sided weakness and numbness. She had dizziness along with that. She continues to have some chest pain but mentions at this not as bad as it was earlier.  Along with that she had a right-sided weakness and numbness of both upper and lower extremity as well as right face. She initially had some headache which is better now denies any neck pain or fever or chills. She also had some paresthesia on the right and when she was walking she was swaying to the right side.  She denies any similar symptoms or hospitalizations for the same in the past.  2 weeks ago she had an episode of acute sinusitis for which she was placed on Augmentin which she is still continuing for one more day. She was using ibuprofen and Sudafed which is not using since last week. Other than this no recent change in her medication.  She had some soft stool but no diarrhea and no active bleeding in her  bowel no abdominal pain.  She denies any smoking history, alcohol abuse, drug abuse.  Hospital Course:  Right sided weakness/numbness - Neurology has been consulted, workup negative for acute CVA. Patient however with persistent symptoms without clear etiology. Continue aspirin daily.  Chest tightness - troponin negative. 2D echo without significant findings.  Sinus tach - TSH normal, resolved shortly after admission.  Anemia - iron deficient  Borderline low cortisol - cortisol 3.7 2/15 am, and a stim test was done 2/16 in the morning which ruled out adrenal insufficiency. Baseline cortisol 14.3 >> 23.7 at 30 min >> 24.2 at 60 min. ? Lupus - inflammatory markers negative, Sed rate and CRP within normal limits. Patient to get established with Rheumatology as an outpatient.   Procedures:  2D echo Study Conclusions Left ventricle: The cavity size was normal. Systolic function was normal. The estimated ejection fraction was in the range of 50% to 55%. Wall motion was normal; there were no regional wall motion abnormalities.   Carotid doppler  Summary:  - The vertebral arteries appear patent with antegrade flow. 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  Consultations:  Neurology  Discharge Exam: Filed Vitals:   06/29/13 1235 06/29/13 1417 06/29/13 2206 06/30/13 0434  BP: 109/64 103/55 117/59 110/57  Pulse: 88 86 68 62  Temp: 98.1 F (36.7 C) 98.1 F (36.7 C) 97.6 F (36.4 C) 98.3 F (36.8 C)  TempSrc: Oral Oral Oral Oral  Resp: 16 16 16 16   Height:      Weight:      SpO2: 100% 100% 100% 100%    General: NAD Cardiovascular: RRR Respiratory: CTA biL  Discharge Instructions  Discharge Orders   Future Appointments Provider Department Dept Phone   07/02/2013 2:30 PM 07/04/2013, MD [facility] [phone]   Future Orders Complete By Expires   OT eval and treat  As  directed 06/30/2014       Medication List         amoxicillin-clavulanate 875-125 MG per tablet  Commonly known as:  AUGMENTIN  Take 1 tablet by mouth 2 (two) times daily. For 10 days     aspirin EC 81 MG tablet  Take 1 tablet (81 mg total) by mouth daily.     doxylamine (Sleep) 25 MG tablet  Commonly known as:  UNISOM  Take 25 mg by mouth at bedtime as needed for sleep.     ferrous sulfate 325 (65 FE) MG tablet  Take 1 tablet (325 mg total) by mouth 3 (three) times daily with meals.     fluticasone 50 MCG/ACT nasal spray  Commonly known as:  FLONASE  Place 2 sprays into both nostrils daily.     guaiFENesin 100 MG/5ML Soln  Commonly known as:  ROBITUSSIN  Take 5 mLs (100 mg total) by mouth every 4 (four) hours as needed for cough or to loosen phlegm.     ibuprofen 600 MG tablet  Commonly known as:  ADVIL,MOTRIN  Take 600 mg by mouth every 6 (six) hours as needed for pain.     pseudoephedrine 120 MG 12 hr tablet  Commonly known as:  SUDAFED 12 HOUR  Take 1 tablet (120 mg total) by mouth 2 (two) times daily.     vitamin B-12 1000 MCG tablet  Commonly known as:  CYANOCOBALAMIN  Take 1,000 mcg by mouth daily.           Follow-up Information   Follow up with PCP. Schedule an appointment as soon as possible for a visit in 1 week.      The results of significant diagnostics from this hospitalization (including imaging, microbiology, ancillary and laboratory) are listed below for reference.    Significant Diagnostic Studies: Ct Head Wo Contrast  06/27/2013   CLINICAL DATA:  Right-sided headache. Right arm pain. Slurred speech.  EXAM: CT HEAD WITHOUT CONTRAST  TECHNIQUE: Contiguous axial images were obtained from the base of the skull through the vertex without intravenous contrast.  COMPARISON:  Head CT 09/27/2012.  FINDINGS: No acute intracranial abnormalities. Specifically, no evidence of acute intracranial hemorrhage, no definite findings of acute/subacute cerebral  ischemia, no mass, mass effect, hydrocephalus or abnormal intra or extra-axial fluid collections. Visualized paranasal sinuses and mastoids are well pneumatized. No acute displaced skull fractures are identified.  IMPRESSION: * No acute intracranial abnormalities. *The appearance of the brain is normal.   Electronically Signed   By: 09/29/2012 M.D.   On: 06/27/2013 18:29   Ct Angio Chest Pe W/cm &/or Wo Cm  06/27/2013   CLINICAL DATA:  Right-sided chest pain, shortness of breath and dizziness. History of lupus and anemia.  EXAM: CT ANGIOGRAPHY CHEST WITH CONTRAST  TECHNIQUE: Multidetector CT imaging of the chest was performed using the standard protocol during bolus administration of intravenous contrast. Multiplanar CT image reconstructions and MIPs were obtained to evaluate the vascular anatomy.  CONTRAST:  06/29/2013 OMNIPAQUE IOHEXOL 350  MG/ML SOLN  COMPARISON:  Chest x-ray obtained earlier today; CT abdomen/ pelvis 02/25/2013  FINDINGS: Mediastinum: Unremarkable CT appearance of the thyroid gland. No suspicious mediastinal or hilar adenopathy. No soft tissue mediastinal mass. Mildly patulous thoracic esophagus containing a small volume of debris.  Heart/Vascular: Adequate opacification of the pulmonary arteries to the proximal subsegmental level. Respiratory motion slightly limits evaluation beyond the segmental arteries. No central filling defect to suggest acute pulmonary embolus. Normal caliber main and central pulmonary arteries. No acute aortic abnormality. There is a bovine configuration of the aortic arch (two vessel arch with common origin of the brachiocephalic and left common carotid arteries), a normal anatomic variant. The heart is within normal limits for size. No pericardial effusion.  Lungs/Pleura: Mild dependent atelectasis in the lower lobes. Otherwise, the lungs are clear. 2 mm nodular opacity affiliated with the minor fissure likely reflects a tiny subpleural lymph node. This is almost  certainly benign. No pleural effusion or pneumothorax.  Bones/Soft Tissues: No acute fracture or aggressive appearing lytic or blastic osseous lesion.  Upper Abdomen: Unremarkable imaged upper abdomen.  Review of the MIP images confirms the above findings.  IMPRESSION: 1. Negative for pulmonary embolus, pneumonia or other acute cardiopulmonary process. 2. Mildly patulous esophagus containing layering fluid. Query clinical history of gastroesophageal reflux disease?   Electronically Signed   By: Malachy Moan M.D.   On: 06/27/2013 20:15   Mr [name] CW Contrast  06/28/2013   CLINICAL DATA:  Weakness and paresthesias in the right arm and leg with unsteadiness. Evaluate for ischemia versus demyelinating disease.  EXAM: MRI HEAD WITHOUT AND WITH CONTRAST  MRA HEAD WITHOUT CONTRAST  TECHNIQUE: Multiplanar, multiecho pulse sequences of the brain and surrounding structures were obtained without and with intravenous contrast. Angiographic images of the head were obtained using MRA technique without contrast.  CONTRAST:  12mL MULTIHANCE GADOBENATE DIMEGLUMINE 529 MG/ML IV SOLN  COMPARISON:  CT HEAD W/O CM dated 06/27/2013; CT HEAD W/O CM dated 09/27/2012  FINDINGS: MRI HEAD FINDINGS  No evidence for acute infarction, hemorrhage, mass lesion, hydrocephalus, or extra-axial fluid. Normal cerebral volume. No white matter disease. No foci of chronic hemorrhage. Flow voids are maintained throughout the carotid, basilar, and vertebral arteries. There are no areas of chronic hemorrhage. Pituitary, pineal, and cerebellar tonsils unremarkable. No upper cervical lesions. Post infusion, no abnormal enhancement of the brain or meninges. Visualized calvarium, skull base, and upper cervical osseous structures unremarkable. Scalp and extracranial soft tissues, orbits, sinuses, and mastoids show no acute process.  MRA HEAD FINDINGS  The internal carotid arteries are widely patent. The basilar artery is widely patent. Vertebrals are  codominant. There is no intracranial stenosis or aneurysm.  IMPRESSION: Negative MRI of the brain. No acute or focal intracranial abnormality is seen.  Unremarkable MRA of the intracranial circulation.   Electronically Signed   By: Davonna Belling M.D.   On: 06/28/2013 12:09   Dg Chest Port 1 View  06/27/2013   CLINICAL DATA:  Chest pain  EXAM: PORTABLE CHEST - 1 VIEW  COMPARISON:  None.  FINDINGS: The lungs are clear and negative for focal airspace consolidation, pulmonary edema or suspicious pulmonary nodule. No pleural effusion or pneumothorax. Cardiac and mediastinal contours are within normal limits. No acute fracture or lytic or blastic osseous lesions. The visualized upper abdominal bowel gas pattern is unremarkable.  IMPRESSION: No active cardiopulmonary disease.   Electronically Signed   By: Malachy Moan M.D.   On: 06/27/2013 18:08   Mr  Mra Head/brain Wo Cm  06/28/2013   CLINICAL DATA:  Weakness and paresthesias in the right arm and leg with unsteadiness. Evaluate for ischemia versus demyelinating disease.  EXAM: MRI HEAD WITHOUT AND WITH CONTRAST  MRA HEAD WITHOUT CONTRAST  TECHNIQUE: Multiplanar, multiecho pulse sequences of the brain and surrounding structures were obtained without and with intravenous contrast. Angiographic images of the head were obtained using MRA technique without contrast.  CONTRAST:  12mL MULTIHANCE GADOBENATE DIMEGLUMINE 529 MG/ML IV SOLN  COMPARISON:  CT HEAD W/O CM dated 06/27/2013; CT HEAD W/O CM dated 09/27/2012  FINDINGS: MRI HEAD FINDINGS  No evidence for acute infarction, hemorrhage, mass lesion, hydrocephalus, or extra-axial fluid. Normal cerebral volume. No white matter disease. No foci of chronic hemorrhage. Flow voids are maintained throughout the carotid, basilar, and vertebral arteries. There are no areas of chronic hemorrhage. Pituitary, pineal, and cerebellar tonsils unremarkable. No upper cervical lesions. Post infusion, no abnormal enhancement of the brain  or meninges. Visualized calvarium, skull base, and upper cervical osseous structures unremarkable. Scalp and extracranial soft tissues, orbits, sinuses, and mastoids show no acute process.  MRA HEAD FINDINGS  The internal carotid arteries are widely patent. The basilar artery is widely patent. Vertebrals are codominant. There is no intracranial stenosis or aneurysm.  IMPRESSION: Negative MRI of the brain. No acute or focal intracranial abnormality is seen.  Unremarkable MRA of the intracranial circulation.   Electronically Signed   By: Davonna Belling M.D.   On: 06/28/2013 12:09   Labs: Basic Metabolic Panel:  Recent Labs Lab 06/27/13 1816 06/28/13 0901  NA 137  --   K 3.6*  --   CL 101  --   CO2 22  --   GLUCOSE 74  --   BUN 8  --   CREATININE 0.61  --   CALCIUM 8.6  --   MG  --  2.0   Liver Function Tests:  Recent Labs Lab 06/27/13 1816  AST 12  ALT 8  ALKPHOS 76  BILITOT <0.2*  PROT 7.4  ALBUMIN 3.7    Recent Labs Lab 06/27/13 1816  LIPASE 34   CBC:  Recent Labs Lab 06/27/13 1816 06/28/13 0320 06/29/13 0402 06/30/13 0412  WBC 8.9 10.3 7.2 9.6  NEUTROABS 5.6  --   --   --   HGB 8.9* 7.4* 7.8* 7.8*  HCT 29.4* 25.1* 26.2* 27.1*  MCV 66.1* 66.9* 66.5* 67.1*  PLT 359 290 299 300   Cardiac Enzymes:  Recent Labs Lab 06/27/13 1816 06/27/13 1923 06/28/13 0320  TROPONINI <0.30 <0.30 <0.30   BNP: BNP (last 3 results)  Recent Labs  06/27/13 1816  PROBNP 90.9   CBG:  Recent Labs Lab 06/29/13 0744 06/29/13 1233 06/29/13 1708 06/29/13 2210 06/30/13 0737  GLUCAP 86 94 105* 101* 74   Signed:  Kaysie Michelini  [facility] 06/30/2013, 11:45 AM

## 2013-06-30 NOTE — Discharge Instructions (Signed)
You were cared for by a hospitalist during your hospital stay. If you have any questions about your discharge medications or the care you received while you were in the hospital after you are discharged, you can call the unit and asked to speak with the hospitalist on call if the hospitalist that took care of you is not available. Once you are discharged, your primary care physician will handle any further medical issues. Please note that NO REFILLS for any discharge medications will be authorized once you are discharged, as it is imperative that you return to your primary care physician (or establish a relationship with a primary care physician if you do not have one) for your aftercare needs so that they can reassess your need for medications and monitor your lab values. °  °  °If you do not have a primary care physician, you can call [phone] for a physician referral. ° °Follow with Primary MD in 5-7 days  ° °Get CBC, CMP checked by your doctor and again as further instructed.  °Get a 2 view Chest X ray done next visit if you had Pneumonia of Lung problems at the Hospital. ° °Get Medicines reviewed and adjusted. ° °Please request your Prim.MD to go over all Hospital Tests and Procedure/Radiological results at the follow up, please get all Hospital records sent to your Prim MD by signing hospital release before you go home. ° °Activity: As tolerated with Full fall precautions use walker/cane & assistance as needed ° °Diet: regular ° °For Heart failure patients - Check your Weight same time everyday, if you gain over 2 pounds, or you develop in leg swelling, experience more shortness of breath or chest pain, call your Primary MD immediately. Follow Cardiac Low Salt Diet and 1.8 lit/day fluid restriction. ° °Disposition Home ° °If you experience worsening of your admission symptoms, develop shortness of breath, life threatening emergency, suicidal or homicidal thoughts you must seek medical attention immediately by  calling 911 or calling your MD immediately  if symptoms less severe. ° °You Must read complete instructions/literature along with all the possible adverse reactions/side effects for all the Medicines you take and that have been prescribed to you. Take any new Medicines after you have completely understood and accpet all the possible adverse reactions/side effects.  ° °Do not drive and provide baby sitting services if your were admitted for syncope or siezures until you have seen by Primary MD or a Neurologist and advised to do so again. ° °Do not drive when taking Pain medications.  ° °Do not take more than prescribed Pain, Sleep and Anxiety Medications ° °Special Instructions: If you have smoked or chewed Tobacco  in the last 2 yrs please stop smoking, stop any regular Alcohol  and or any Recreational drug use. ° °Wear Seat belts while driving. ° °

## 2013-07-01 LAB — ANTI-NUCLEAR AB-TITER (ANA TITER): ANA Titer 1: 1:80 {titer} — ABNORMAL HIGH

## 2013-07-01 LAB — ANA: ANA: POSITIVE — AB

## 2013-07-01 LAB — COMPLEMENT, TOTAL: Compl, Total (CH50): 57 U/mL (ref 31–60)

## 2013-07-02 ENCOUNTER — Ambulatory Visit: Payer: Medicaid Other | Admitting: Obstetrics

## 2013-07-13 ENCOUNTER — Encounter: Payer: Self-pay | Admitting: Obstetrics

## 2013-07-13 ENCOUNTER — Ambulatory Visit (INDEPENDENT_AMBULATORY_CARE_PROVIDER_SITE_OTHER): Payer: Medicaid Other | Admitting: Obstetrics

## 2013-07-13 VITALS — BP 124/78 | HR 74 | Temp 98.2°F | Ht 62.0 in | Wt 137.0 lb

## 2013-07-13 DIAGNOSIS — N939 Abnormal uterine and vaginal bleeding, unspecified: Secondary | ICD-10-CM | POA: Insufficient documentation

## 2013-07-13 DIAGNOSIS — D649 Anemia, unspecified: Secondary | ICD-10-CM

## 2013-07-13 DIAGNOSIS — N926 Irregular menstruation, unspecified: Secondary | ICD-10-CM

## 2013-07-13 DIAGNOSIS — Z113 Encounter for screening for infections with a predominantly sexual mode of transmission: Secondary | ICD-10-CM

## 2013-07-13 DIAGNOSIS — Z0000 Encounter for general adult medical examination without abnormal findings: Secondary | ICD-10-CM

## 2013-07-13 DIAGNOSIS — N644 Mastodynia: Secondary | ICD-10-CM | POA: Insufficient documentation

## 2013-07-13 HISTORY — DX: Abnormal uterine and vaginal bleeding, unspecified: N93.9

## 2013-07-13 LAB — COMPREHENSIVE METABOLIC PANEL
ALBUMIN: 4.2 g/dL (ref 3.5–5.2)
ALT: 10 U/L (ref 0–35)
AST: 15 U/L (ref 0–37)
Alkaline Phosphatase: 59 U/L (ref 39–117)
BUN: 11 mg/dL (ref 6–23)
CALCIUM: 8.5 mg/dL (ref 8.4–10.5)
CHLORIDE: 109 meq/L (ref 96–112)
CO2: 21 meq/L (ref 19–32)
Creat: 0.62 mg/dL (ref 0.50–1.10)
Glucose, Bld: 61 mg/dL — ABNORMAL LOW (ref 70–99)
POTASSIUM: 4.1 meq/L (ref 3.5–5.3)
Sodium: 140 mEq/L (ref 135–145)
Total Bilirubin: 0.3 mg/dL (ref 0.2–1.2)
Total Protein: 6.8 g/dL (ref 6.0–8.3)

## 2013-07-13 LAB — POCT URINALYSIS DIPSTICK
BILIRUBIN UA: NEGATIVE
GLUCOSE UA: NEGATIVE
KETONES UA: NEGATIVE
LEUKOCYTES UA: NEGATIVE
Nitrite, UA: NEGATIVE
PH UA: 5
Protein, UA: NEGATIVE
Spec Grav, UA: >=1.03
Urobilinogen, UA: NEGATIVE

## 2013-07-13 LAB — CBC WITH DIFFERENTIAL/PLATELET
BASOS ABS: 0 10*3/uL (ref 0.0–0.1)
Basophils Relative: 0 % (ref 0–1)
Eosinophils Absolute: 0.1 10*3/uL (ref 0.0–0.7)
Eosinophils Relative: 2 % (ref 0–5)
HCT: 34.8 % — ABNORMAL LOW (ref 36.0–46.0)
Hemoglobin: 10.6 g/dL — ABNORMAL LOW (ref 12.0–15.0)
LYMPHS ABS: 1.6 10*3/uL (ref 0.7–4.0)
LYMPHS PCT: 22 % (ref 12–46)
MCH: 21.7 pg — ABNORMAL LOW (ref 26.0–34.0)
MCHC: 30.5 g/dL (ref 30.0–36.0)
MCV: 71.3 fL — ABNORMAL LOW (ref 78.0–100.0)
MONO ABS: 0.7 10*3/uL (ref 0.1–1.0)
Monocytes Relative: 10 % (ref 3–12)
NEUTROS ABS: 4.8 10*3/uL (ref 1.7–7.7)
Neutrophils Relative %: 66 % (ref 43–77)
Platelets: 336 10*3/uL (ref 150–400)
RBC: 4.88 MIL/uL (ref 3.87–5.11)
RDW: 24.7 % — AB (ref 11.5–15.5)
WBC: 7.2 10*3/uL (ref 4.0–10.5)

## 2013-07-13 MED ORDER — CITRANATAL 90 DHA 90-1 & 300 MG PO MISC: 1.0000 | Freq: Every day | ORAL | Status: DC

## 2013-07-13 MED ORDER — NORGESTIMATE-ETH ESTRADIOL 0.25-35 MG-MCG PO TABS: 1.0000 | ORAL_TABLET | Freq: Every day | ORAL | Status: DC

## 2013-07-13 NOTE — Progress Notes (Signed)
Subjective:     Danielle Holden is a 43 y.o. female here for a routine exam.  Current complaints: Pt states that she has a hx of ovarian cyst and fibroids.  Pt states that her cycles usually come every 2 weeks.  Pt states that she will have some spotting the week before her cycle starts.  Pt states that her cycle are heavy and she has become anemic with this.  Pt states that she is currently getting sharp pains on her right side. Pt states that she has had them on both sides previously.   PT states that sexual intercourse is painful and will have bleeding afterwards.   Pt has also had cyst in her left breast.  Pt would like to have mammogram scheduled.  Personal health questionnaire reviewed: yes.   Gynecologic History Patient's last menstrual period was 06/27/2013. Contraception: tubal ligation Last Pap: 07/2012. 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 Results were: normal  Obstetric History OB History  Gravida Para Term Preterm AB SAB TAB Ectopic Multiple Living  4 3 3  1 1    3     # Outcome Date GA Lbr Len/2nd Weight Sex Delivery Anes PTL Lv  4 SAB 2009 [redacted]w[redacted]d         3 TRM 1993 [redacted]w[redacted]d   M SVD   Y  2 TRM 08/1990 [redacted]w[redacted]d   F SVD   Y  1 TRM 1989    M LTCS   Y       The following portions of the patient's history were reviewed and updated as appropriate: allergies, current medications, past family history, past medical history, past social history, past surgical history and problem list.  Review of Systems Pertinent items are noted in HPI.    Objective:    General appearance: alert and no distress Breasts: normal appearance, no masses or tenderness Abdomen: normal findings: soft, non-tender Pelvic: cervix normal in appearance, external genitalia normal, no adnexal masses or tenderness, no cervical motion tenderness, rectovaginal septum normal, uterus normal size, shape, and consistency and vagina normal without discharge    Assessment:    Healthy female exam.   AUB, with heavy frequent cycles.  Dysmenorrhea  Failed BTL.   Plan:    Education reviewed: safe sex/STD prevention, self breast exams and management of AUB. Contraception: OCP (estrogen/progesterone). Mammogram ordered. Follow up in: 2 weeks. Ultrasound ordered   Needs Endometrial Biopsy.

## 2013-07-14 ENCOUNTER — Encounter: Payer: Self-pay | Admitting: Obstetrics

## 2013-07-14 LAB — GC/CHLAMYDIA PROBE AMP
CT Probe RNA: NEGATIVE
GC Probe RNA: NEGATIVE

## 2013-07-14 LAB — PAP IG W/ RFLX HPV ASCU

## 2013-07-14 LAB — WET PREP BY MOLECULAR PROBE
CANDIDA SPECIES: NEGATIVE
Gardnerella vaginalis: NEGATIVE
Trichomonas vaginosis: NEGATIVE

## 2013-07-14 LAB — HIV ANTIBODY (ROUTINE TESTING W REFLEX): HIV: NONREACTIVE

## 2013-07-14 LAB — RPR

## 2013-07-14 LAB — TSH: TSH: 1.598 u[IU]/mL (ref 0.350–4.500)

## 2013-07-14 LAB — HEPATITIS B SURFACE ANTIGEN: Hepatitis B Surface Ag: NEGATIVE

## 2013-07-14 LAB — HEPATITIS C ANTIBODY: HCV Ab: NEGATIVE

## 2013-07-21 ENCOUNTER — Other Ambulatory Visit: Payer: Self-pay | Admitting: Obstetrics

## 2013-07-21 ENCOUNTER — Ambulatory Visit (HOSPITAL_COMMUNITY)
Admission: RE | Admit: 2013-07-21 | Discharge: 2013-07-21 | Disposition: A | Discharge status: Home / Self Care | Payer: Medicaid Other | Source: Ambulatory Visit | Attending: Obstetrics | Admitting: Obstetrics

## 2013-07-21 ENCOUNTER — Encounter: Payer: Self-pay | Admitting: Obstetrics

## 2013-07-21 DIAGNOSIS — N939 Abnormal uterine and vaginal bleeding, unspecified: Secondary | ICD-10-CM

## 2013-07-21 DIAGNOSIS — N949 Unspecified condition associated with female genital organs and menstrual cycle: Secondary | ICD-10-CM | POA: Insufficient documentation

## 2013-07-21 DIAGNOSIS — N925 Other specified irregular menstruation: Secondary | ICD-10-CM | POA: Insufficient documentation

## 2013-07-21 DIAGNOSIS — N938 Other specified abnormal uterine and vaginal bleeding: Secondary | ICD-10-CM | POA: Insufficient documentation

## 2013-07-21 DIAGNOSIS — N854 Malposition of uterus: Secondary | ICD-10-CM | POA: Insufficient documentation

## 2013-07-21 DIAGNOSIS — N644 Mastodynia: Secondary | ICD-10-CM

## 2013-07-22 ENCOUNTER — Encounter: Payer: Self-pay | Admitting: Obstetrics & Gynecology

## 2013-07-23 ENCOUNTER — Other Ambulatory Visit: Payer: Self-pay | Admitting: Obstetrics

## 2013-07-23 DIAGNOSIS — N644 Mastodynia: Secondary | ICD-10-CM

## 2013-07-27 ENCOUNTER — Encounter: Payer: Self-pay | Admitting: Obstetrics

## 2013-07-27 ENCOUNTER — Ambulatory Visit (INDEPENDENT_AMBULATORY_CARE_PROVIDER_SITE_OTHER): Payer: Medicaid Other | Admitting: Obstetrics

## 2013-07-27 VITALS — BP 144/80 | HR 85 | Temp 98.0°F | Ht 62.0 in | Wt 137.0 lb

## 2013-07-27 DIAGNOSIS — N939 Abnormal uterine and vaginal bleeding, unspecified: Secondary | ICD-10-CM

## 2013-07-27 DIAGNOSIS — N926 Irregular menstruation, unspecified: Secondary | ICD-10-CM

## 2013-07-27 NOTE — Progress Notes (Signed)
Subjective:     Danielle Holden is a 43 y.o. female here for a routine exam.  Current complaints: Patient is in the office today for a follow up visit for blood work. Patient states she has been dizzy lately that it doesn't go away.  Patient denies any concerns.  Personal health questionnaire reviewed: yes.   Gynecologic History Patient's last menstrual period was 07/13/2013. Contraception: none  Obstetric History OB History  Gravida Para Term Preterm AB SAB TAB Ectopic Multiple Living  4 3 3  1 1    3     # Outcome Date GA Lbr Len/2nd Weight Sex Delivery Anes PTL Lv  4 SAB 2009 [redacted]w[redacted]d         3 TRM 1993 [redacted]w[redacted]d   M SVD   Y  2 TRM 08/1990 [redacted]w[redacted]d   F SVD   Y  1 TRM 1989    M LTCS   Y       The following portions of the patient's history were reviewed and updated as appropriate: allergies, current medications, past family history, past medical history, past social history, past surgical history and problem list.  Review of Systems Pertinent items are noted in HPI.    Objective:    No exam performed today, Consult only..    Assessment:    AUB   Plan:    Education reviewed: AUB. Contraception: OCP (estrogen/progesterone) and partner has vasectomy. Follow up in: 6 months.

## 2013-08-19 ENCOUNTER — Emergency Department (HOSPITAL_COMMUNITY)
Admission: EM | Admit: 2013-08-19 | Discharge: 2013-08-19 | Disposition: A | Discharge status: Home / Self Care | Payer: Medicaid Other | Attending: Emergency Medicine | Admitting: Emergency Medicine

## 2013-08-19 ENCOUNTER — Encounter (HOSPITAL_COMMUNITY): Payer: Self-pay | Admitting: Emergency Medicine

## 2013-08-19 ENCOUNTER — Emergency Department (HOSPITAL_COMMUNITY): Payer: Medicaid Other

## 2013-08-19 DIAGNOSIS — N938 Other specified abnormal uterine and vaginal bleeding: Secondary | ICD-10-CM | POA: Insufficient documentation

## 2013-08-19 DIAGNOSIS — M129 Arthropathy, unspecified: Secondary | ICD-10-CM | POA: Insufficient documentation

## 2013-08-19 DIAGNOSIS — K921 Melena: Secondary | ICD-10-CM | POA: Insufficient documentation

## 2013-08-19 DIAGNOSIS — R079 Chest pain, unspecified: Secondary | ICD-10-CM | POA: Insufficient documentation

## 2013-08-19 DIAGNOSIS — Z8639 Personal history of other endocrine, nutritional and metabolic disease: Secondary | ICD-10-CM | POA: Insufficient documentation

## 2013-08-19 DIAGNOSIS — Z79899 Other long term (current) drug therapy: Secondary | ICD-10-CM | POA: Insufficient documentation

## 2013-08-19 DIAGNOSIS — M25569 Pain in unspecified knee: Secondary | ICD-10-CM

## 2013-08-19 DIAGNOSIS — N925 Other specified irregular menstruation: Secondary | ICD-10-CM | POA: Insufficient documentation

## 2013-08-19 DIAGNOSIS — M7989 Other specified soft tissue disorders: Secondary | ICD-10-CM

## 2013-08-19 DIAGNOSIS — N949 Unspecified condition associated with female genital organs and menstrual cycle: Secondary | ICD-10-CM | POA: Insufficient documentation

## 2013-08-19 DIAGNOSIS — Z862 Personal history of diseases of the blood and blood-forming organs and certain disorders involving the immune mechanism: Secondary | ICD-10-CM | POA: Insufficient documentation

## 2013-08-19 DIAGNOSIS — Z8659 Personal history of other mental and behavioral disorders: Secondary | ICD-10-CM | POA: Insufficient documentation

## 2013-08-19 DIAGNOSIS — M79609 Pain in unspecified limb: Secondary | ICD-10-CM

## 2013-08-19 LAB — I-STAT CHEM 8, ED
BUN: 8 mg/dL (ref 6–23)
CALCIUM ION: 1.17 mmol/L (ref 1.12–1.23)
CHLORIDE: 102 meq/L (ref 96–112)
Creatinine, Ser: 0.8 mg/dL (ref 0.50–1.10)
GLUCOSE: 76 mg/dL (ref 70–99)
HEMATOCRIT: 43 % (ref 36.0–46.0)
HEMOGLOBIN: 14.6 g/dL (ref 12.0–15.0)
Potassium: 3.7 mEq/L (ref 3.7–5.3)
Sodium: 140 mEq/L (ref 137–147)
TCO2: 24 mmol/L (ref 0–100)

## 2013-08-19 LAB — CBC WITH DIFFERENTIAL/PLATELET
BASOS PCT: 1 % (ref 0–1)
Basophils Absolute: 0 10*3/uL (ref 0.0–0.1)
EOS ABS: 0.2 10*3/uL (ref 0.0–0.7)
EOS PCT: 2 % (ref 0–5)
HEMATOCRIT: 38.5 % (ref 36.0–46.0)
Hemoglobin: 12.3 g/dL (ref 12.0–15.0)
Lymphocytes Relative: 28 % (ref 12–46)
Lymphs Abs: 2.4 10*3/uL (ref 0.7–4.0)
MCH: 24.3 pg — AB (ref 26.0–34.0)
MCHC: 31.9 g/dL (ref 30.0–36.0)
MCV: 75.9 fL — ABNORMAL LOW (ref 78.0–100.0)
MONO ABS: 0.6 10*3/uL (ref 0.1–1.0)
MONOS PCT: 7 % (ref 3–12)
Neutro Abs: 5.4 10*3/uL (ref 1.7–7.7)
Neutrophils Relative %: 63 % (ref 43–77)
Platelets: 261 10*3/uL (ref 150–400)
RBC: 5.07 MIL/uL (ref 3.87–5.11)
RDW: 24.5 % — ABNORMAL HIGH (ref 11.5–15.5)
WBC: 8.5 10*3/uL (ref 4.0–10.5)

## 2013-08-19 MED ORDER — OXYCODONE-ACETAMINOPHEN 5-325 MG PO TABS: 1.0000 | ORAL_TABLET | Freq: Four times a day (QID) | ORAL | Status: DC | PRN

## 2013-08-19 MED ORDER — FENTANYL CITRATE 0.05 MG/ML IJ SOLN: 50.0000 ug | Freq: Once | INTRAMUSCULAR | Status: DC

## 2013-08-19 MED ORDER — OXYCODONE-ACETAMINOPHEN 5-325 MG PO TABS
1.0000 | ORAL_TABLET | Freq: Once | ORAL | Status: AC
  Administered 2013-08-19: 1 via ORAL
  Filled 2013-08-19: qty 1

## 2013-08-19 NOTE — Progress Notes (Signed)
Right lower extremity venous duplex completed.  Right:  No evidence of DVT, superficial thrombosis, or Baker's cyst.  Left:  Negative for DVT in the common femoral vein.  

## 2013-08-19 NOTE — Progress Notes (Signed)
   CARE MANAGEMENT ED NOTE 08/19/2013  Patient:  [facility]   Account Number:  [id number]  Date Initiated:  08/19/2013  Documentation initiated by:  Radford Pax  Subjective/Objective Assessment:   Patient presents to Ed with right leg pain for two days.     Subjective/Objective Assessment Detail:     Action/Plan:   Action/Plan Detail:   Anticipated DC Date:  08/19/2013     Status Recommendation to Physician:   Result of Recommendation:    Other ED Services  Consult Working Plan    DC Planning Services  Other  PCP issues    Choice offered to / List presented to:            Status of service:  Completed, signed off  ED Comments:   ED Comments Detail:  Patient reports her pcp is Dr. Virl Son of [facility] in [place].  System updated.

## 2013-08-19 NOTE — ED Notes (Addendum)
Pt states that she noticed pain on the back of her rt leg since yesterday.  States there is a vein on the back of her leg that is bulging.  C/o leg swelling.  Pt states that she is feeling short of breath.  Pt is able to complete sentences.  02 sat 100%

## 2013-08-19 NOTE — ED Provider Notes (Signed)
CSN: [id number]     Arrival date & time 08/19/13  1409 History   First MD Initiated Contact with Patient 08/19/13 1517     Chief Complaint  Patient presents with  . Leg Swelling     (Consider location/radiation/quality/duration/timing/severity/associated sxs/prior Treatment) The history is provided by the patient.   patient states she's had pain in her right leg the last 2 days. She states began in the hip and at work his way down to his leg. She states it is so swollen she cannot get her pants on anymore. No fevers. No trauma. She states she always seems that weakness and issues on her right side. She states she now has some pains behind her knee also. She also states that she has had some right-sided abdominal/chest pain. She states she felt as if he came up from the right hip. No difficulty breathing, although she states it does feel worse with breathing. No trauma. No hemoptysis. She states she has had heavy vaginal bleeding and was seen for that during a recent hospital admission. She also states she now has had some rectal bleeding. She states she's previously diagnosed with lupus, but states the tests have been positive at times, but not consistently.   Past Medical History  Diagnosis Date  . Anxiety   . Arthritis   . Anemia   . Lupus   . Hypoglycemia    Past Surgical History  Procedure Laterality Date  . Cesarean section    . Appendectomy    . Tubal ligation     Family History  Problem Relation Age of Onset  . Cancer Maternal Grandmother   . Cancer Paternal Grandmother    History  Substance Use Topics  . Smoking status: Never Smoker   . Smokeless tobacco: Never Used  . Alcohol Use: No   OB History   Grav Para Term Preterm Abortions TAB SAB Ect Mult Living   4 3 3  1  1   3      Review of Systems  Constitutional: Negative for activity change and appetite change.  Eyes: Negative for pain.  Respiratory: Negative for chest tightness and shortness of breath.    Cardiovascular: Positive for chest pain and leg swelling.  Gastrointestinal: Positive for blood in stool. Negative for nausea, vomiting, abdominal pain and diarrhea.  Genitourinary: Positive for vaginal bleeding and menstrual problem. Negative for flank pain.  Musculoskeletal: Negative for back pain and neck stiffness.  Skin: Negative for rash and wound.  Neurological: Negative for weakness, numbness and headaches.  Psychiatric/Behavioral: Negative for behavioral problems.      Allergies  Trazodone and nefazodone and Anesthetics, amide  Home Medications   Current Outpatient Rx  Name  Route  Sig  Dispense  Refill  . diphenhydrAMINE (BENADRYL) 25 MG tablet   Oral   Take 25 mg by mouth every 6 (six) hours as needed for allergies.          ibuprofen (ADVIL,MOTRIN) 600 MG tablet   Oral   Take 600 mg by mouth every 6 (six) hours as needed for pain.         . norgestimate-ethinyl estradiol (ORTHO-CYCLEN,SPRINTEC,PREVIFEM) 0.25-35 MG-MCG tablet   Oral   Take 1 tablet by mouth daily.   1 Package   11    BP 111/57  Pulse 75  Temp(Src) 98.2 F (36.8 C) (Oral)  Resp 18  SpO2 97%  LMP 07/25/2013 Physical Exam  Nursing note and vitals reviewed. Constitutional: She is oriented to person,  place, and time. She appears well-developed and well-nourished.  HENT:  Head: Normocephalic and atraumatic.  Eyes: EOM are normal. Pupils are equal, round, and reactive to light.  Neck: Normal range of motion. Neck supple.  Cardiovascular: Normal rate, regular rhythm and normal heart sounds.   No murmur heard. Pulmonary/Chest: Effort normal and breath sounds normal. No respiratory distress. She has no wheezes. She has no rales. She exhibits tenderness.  Mild right lower chest tenderness.   Abdominal: Soft. Bowel sounds are normal. She exhibits no distension. There is tenderness. There is no rebound and no guarding.  Mild right upper quadrant tenderness no rebound guarding.   Musculoskeletal: Normal range of motion.  Tenderness behind right knee. Mild vascular prominence. Patient states there is a lot of swelling, however minimal swelling is visualized.  Neurological: She is alert and oriented to person, place, and time. No cranial nerve deficit.  Skin: Skin is warm and dry.  Psychiatric: She has a normal mood and affect. Her speech is normal.   mild right knee effusion  ED Course  Procedures (including critical care time) Labs Review Labs Reviewed  CBC WITH DIFFERENTIAL - Abnormal; Notable for the following:    MCV 75.9 (*)    MCH 24.3 (*)    RDW 24.5 (*)    All other components within normal limits  I-STAT CHEM 8, ED   Imaging Review Dg Chest 2 View  08/19/2013   CLINICAL DATA:  Chest pain.  EXAM: CHEST  2 VIEW  COMPARISON:  June 27, 2013.  FINDINGS: The heart size and mediastinal contours are within normal limits. Both lungs are clear. No pleural effusion or pneumothorax is noted. The visualized skeletal structures are unremarkable.  IMPRESSION: No acute cardiopulmonary abnormality seen.   Electronically Signed   By: Roque Lias M.D.   On: 08/19/2013 15:48     EKG Interpretation None      MDM   Final diagnoses:  Knee pain   Patient with knee pain. There's mild effusion right knee. No trauma. Has recently also been worked up for chest pain. X-ray reassuring. Negative Doppler. Will discharge home. We'll followup with her PCP    Juliet Rude. Rubin Payor, MD 08/19/13 [phone]

## 2013-08-19 NOTE — Discharge Instructions (Signed)
Knee Pain Knee pain can be a result of an injury or other medical conditions. Treatment will depend on the cause of your pain. HOME CARE  Only take medicine as told by your doctor.  Keep a healthy weight. Being overweight can make the knee hurt more.  Stretch before exercising or playing sports.  If there is constant knee pain, change the way you exercise. Ask your doctor for advice.  Make sure shoes fit well. Choose the right shoe for the sport or activity.  Protect your knees. Wear kneepads if needed.  Rest when you are tired. GET HELP RIGHT AWAY IF:   Your knee pain does not stop.  Your knee pain does not get better.  Your knee joint feels hot to the touch.  You have a fever. MAKE SURE YOU:   Understand these instructions.  Will watch this condition.  Will get help right away if you are not doing well or get worse. Document Released: 07/27/2008 Document Revised: 07/23/2011 Document Reviewed: 07/27/2008 [employer] Patient Information 2014 [employer].  

## 2013-08-28 ENCOUNTER — Ambulatory Visit
Admission: RE | Admit: 2013-08-28 | Discharge: 2013-08-28 | Disposition: A | Discharge status: Home / Self Care | Payer: Medicaid Other | Source: Ambulatory Visit | Attending: Obstetrics | Admitting: Obstetrics

## 2013-08-28 DIAGNOSIS — N644 Mastodynia: Secondary | ICD-10-CM

## 2013-09-28 ENCOUNTER — Encounter: Payer: Self-pay | Admitting: Neurology

## 2013-09-28 ENCOUNTER — Ambulatory Visit (INDEPENDENT_AMBULATORY_CARE_PROVIDER_SITE_OTHER): Payer: Medicaid Other | Admitting: Neurology

## 2013-09-28 ENCOUNTER — Encounter (INDEPENDENT_AMBULATORY_CARE_PROVIDER_SITE_OTHER): Payer: Self-pay

## 2013-09-28 VITALS — BP 123/76 | HR 66 | Wt 140.5 lb

## 2013-09-28 DIAGNOSIS — G43909 Migraine, unspecified, not intractable, without status migrainosus: Secondary | ICD-10-CM | POA: Insufficient documentation

## 2013-09-28 HISTORY — DX: Migraine, unspecified, not intractable, without status migrainosus: G43.909

## 2013-09-28 MED ORDER — RIZATRIPTAN BENZOATE 5 MG PO TBDP: 5.0000 mg | ORAL_TABLET | ORAL | Status: DC | PRN

## 2013-09-28 MED ORDER — NORTRIPTYLINE HCL 10 MG PO CAPS: ORAL_CAPSULE | ORAL | Status: DC

## 2013-09-28 NOTE — Progress Notes (Signed)
PATIENT: Danielle Holden DOB: 1970/12/27  HISTORICAL  Danielle Holden is a 43 years old female, on at today's clinical visit referred by her primary care physician [name] for evaluation of weakness at the right upper extremity.  She recently moved from [facility], is on disability due to normal disorder, also had a history of mild anemia, due to heavy menstrual bleeding, has improved with contraceptive, and iron pills,  She had past medical history of migraine headaches, still getting 3 migraines in one week, her typical migraines right side retro-orbital area severe pounding headache was associated light noise sensitivity, lasting 3-4 hours, improved by sleeping dark quiet room,  She also complains of right arm and leg weakness, intermittent since April 2014, seems to getting worse, sometimes with severe headaches she also has blurry vision, slurred speech, mild gait difficulty due to right knee pain  She carried a diagnosis of lupus per patient based on previous serology,  Recent MRI of the brain February 2015 with without contrast revealed that was normal, MRA of the brain was normal next left laboratory showed normal CBC, CMP,   REVIEW OF SYSTEMS: Full 14 system review of systems performed and notable only for fever, chills, fatigue, palpitation, sweating meds, hearing loss, blurred vision, shortness of breath, wheezing, bloody stool, anemia, feeling cold, joint pain, joint sweating, energy, runny nose, headaches, numbness, weakness, difficulty swallowing, dizziness, insomnia, sleepiness, depression, anxiety, not enough sleep  ALLERGIES: Allergies  Allergen Reactions  . Trazodone And Nefazodone Anaphylaxis    Tongue swells.  . Anesthetics, Amide Other (See Comments)    Heart stopped and stopped breathing    HOME MEDICATIONS: Current Outpatient Prescriptions on File Prior to Visit  Medication Sig Dispense Refill  . diphenhydrAMINE (BENADRYL) 25 MG tablet Take 25  mg by mouth every 6 (six) hours as needed for allergies.       Marland Kitchen ibuprofen (ADVIL,MOTRIN) 600 MG tablet Take 600 mg by mouth every 6 (six) hours as needed for pain.        PAST MEDICAL HISTORY: Past Medical History  Diagnosis Date  . Anxiety   . Arthritis   . Anemia   . Lupus   . Hypoglycemia     PAST SURGICAL HISTORY: Past Surgical History  Procedure Laterality Date  . Cesarean section    . Appendectomy    . Tubal ligation      FAMILY HISTORY: Family History  Problem Relation Age of Onset  . Cancer Maternal Grandmother   . Cancer Paternal Grandmother   . Suicidality Father     SOCIAL HISTORY:  History   Social History  . Marital Status: Single    Spouse Name: [name]    Number of Children: 3  . Years of Education: 9th   Occupational History    Disabled due to mood disorder.   Social History Main Topics  . Smoking status: Former Games developer  . Smokeless tobacco: Never Used     Comment: Quit in 1989  . Alcohol Use: No  . Drug Use: No     Comment: Quit in 1991  . Sexual Activity: Not Currently    Birth Control/ Protection: None   Other Topics Concern  . Not on file   Social History Narrative   Patient lives at home alone and she is disabled.   Education 9th grade.   Right handed.   Caffeine one cup of coffee not daily.      PHYSICAL EXAM   Filed Vitals:  09/28/13 0953  BP: 123/76  Pulse: 66  Weight: 140 lb 8 oz (63.73 kg)    Not recorded    Body mass index is 25.69 kg/(m^2).   Generalized: In no acute distress  Neck: Supple, no carotid bruits   Cardiac: Regular rate rhythm  Pulmonary: Clear to auscultation bilaterally  Musculoskeletal: No deformity  Neurological examination  Mentation: Alert oriented to time, place, history taking, and causual conversation  Cranial nerve II-XII: Pupils were equal round reactive to light. Extraocular movements were full.  Visual field were full on confrontational test. Bilateral fundi were sharp.   Facial sensation and strength were normal. Hearing was intact to finger rubbing bilaterally. Uvula tongue midline.  Head turning and shoulder shrug and were normal and symmetric.Tongue protrusion into cheek strength was normal.  Motor: Normal tone, bulk and strength.  Sensory: Intact to fine touch, pinprick, preserved vibratory sensation, and proprioception at toes.  Coordination: Normal finger to nose, heel-to-shin bilaterally there was no truncal ataxia  Gait: Rising up from seated position without assistance, normal stance, without trunk ataxia, moderate stride, good arm swing, smooth turning, able to perform tiptoe, and heel walking without difficulty.   Romberg signs: Negative  Deep tendon reflexes: Brachioradialis 2/2, biceps 2/2, triceps 2/2, patellar 2/2, Achilles 2/2, plantar responses were flexor bilaterally.   DIAGNOSTIC DATA (LABS, IMAGING, TESTING) - I reviewed patient records, labs, notes, testing and imaging myself where available.  Lab Results  Component Value Date   WBC 8.5 08/19/2013   HGB 14.6 08/19/2013   HCT 43.0 08/19/2013   MCV 75.9* 08/19/2013   PLT 261 08/19/2013      Component Value Date/Time   NA 140 08/19/2013 1543   K 3.7 08/19/2013 1543   CL 102 08/19/2013 1543   CO2 21 07/13/2013 1343   GLUCOSE 76 08/19/2013 1543   BUN 8 08/19/2013 1543   CREATININE 0.80 08/19/2013 1543   CREATININE 0.62 07/13/2013 1343   CALCIUM 8.5 07/13/2013 1343   PROT 6.8 07/13/2013 1343   ALBUMIN 4.2 07/13/2013 1343   AST 15 07/13/2013 1343   ALT 10 07/13/2013 1343   ALKPHOS 59 07/13/2013 1343   BILITOT 0.3 07/13/2013 1343   GFRNONAA >90 06/27/2013 1816   GFRAA >90 06/27/2013 1816   Lab Results  Component Value Date   CHOL 125 06/28/2013   HDL 61 06/28/2013   LDLCALC 57 06/28/2013   TRIG 34 06/28/2013   CHOLHDL 2.0 06/28/2013   Lab Results  Component Value Date   HGBA1C 5.3 06/28/2013   Lab Results  Component Value Date   VITAMINB12 380 06/28/2013   Lab Results  Component Value Date   TSH 1.598  07/13/2013      ASSESSMENT AND PLAN  Danielle Holden is a 44 y.o. female complains of  frequent headaches, with migraine features, essentially normal neurological examination, normal MRI of the brain with and without contrast, normal MRA of the brain  1, for her frequent migraines, I will start nortriptyline, 10 mg titrating 20 mg every night as preventive medications   2, Maxalt as needed for migraine abortive treatment 3, return to clinic in 3-4 months with Gerlene Fee, M.D. Ph.D.  [facility] [address] [place], [place] [address] [phone]

## 2013-11-09 ENCOUNTER — Emergency Department (HOSPITAL_COMMUNITY): Payer: Medicaid Other

## 2013-11-09 ENCOUNTER — Emergency Department (HOSPITAL_COMMUNITY)
Admission: EM | Admit: 2013-11-09 | Discharge: 2013-11-09 | Disposition: A | Discharge status: Home / Self Care | Payer: Medicaid Other | Attending: Emergency Medicine | Admitting: Emergency Medicine

## 2013-11-09 ENCOUNTER — Encounter (HOSPITAL_COMMUNITY): Payer: Self-pay | Admitting: Emergency Medicine

## 2013-11-09 DIAGNOSIS — M79609 Pain in unspecified limb: Secondary | ICD-10-CM

## 2013-11-09 DIAGNOSIS — M129 Arthropathy, unspecified: Secondary | ICD-10-CM | POA: Insufficient documentation

## 2013-11-09 DIAGNOSIS — Z862 Personal history of diseases of the blood and blood-forming organs and certain disorders involving the immune mechanism: Secondary | ICD-10-CM | POA: Insufficient documentation

## 2013-11-09 DIAGNOSIS — M25469 Effusion, unspecified knee: Secondary | ICD-10-CM | POA: Insufficient documentation

## 2013-11-09 DIAGNOSIS — Z8659 Personal history of other mental and behavioral disorders: Secondary | ICD-10-CM | POA: Insufficient documentation

## 2013-11-09 DIAGNOSIS — IMO0002 Reserved for concepts with insufficient information to code with codable children: Secondary | ICD-10-CM | POA: Insufficient documentation

## 2013-11-09 DIAGNOSIS — M25561 Pain in right knee: Secondary | ICD-10-CM

## 2013-11-09 DIAGNOSIS — Z87891 Personal history of nicotine dependence: Secondary | ICD-10-CM | POA: Insufficient documentation

## 2013-11-09 DIAGNOSIS — M25569 Pain in unspecified knee: Secondary | ICD-10-CM | POA: Insufficient documentation

## 2013-11-09 DIAGNOSIS — Z8639 Personal history of other endocrine, nutritional and metabolic disease: Secondary | ICD-10-CM | POA: Insufficient documentation

## 2013-11-09 DIAGNOSIS — R509 Fever, unspecified: Secondary | ICD-10-CM | POA: Insufficient documentation

## 2013-11-09 MED ORDER — OXYCODONE-ACETAMINOPHEN 5-325 MG PO TABS
1.0000 | ORAL_TABLET | Freq: Once | ORAL | Status: AC
  Administered 2013-11-09: 1 via ORAL
  Filled 2013-11-09: qty 1

## 2013-11-09 MED ORDER — OXYCODONE-ACETAMINOPHEN 5-325 MG PO TABS: 1.0000 | ORAL_TABLET | Freq: Three times a day (TID) | ORAL | Status: DC | PRN

## 2013-11-09 NOTE — Discharge Instructions (Signed)
Please call your doctor for a followup appointment within 24-48 hours. When you talk to your doctor please let them know that you were seen in the emergency department and have them acquire all of your records so that they can discuss the findings with you and formulate a treatment plan to fully care for your new and ongoing problems. Please call and set-up an appointment with your primary care provider Please call and set-up an appointment with Orthopedics and Rheumatology Please rest and stay hydrated Please take medications as prescribed while on pain medications there is to be no drinking alcohol, driving, operating any heavy machinery. If there is extra please disposer proper manner. Please do not take any extra Tylenol with this medication for this can lead to Tylenol overdose and liver issues. Please continue to monitor symptoms closely and if symptoms are to worsen or change (fever greater than 101, chills, chest pain, shortness of breath, difficulty breathing, fall, injury, swelling to the knee, hot to the touch, redness, red streaks running down the leg) please report back to the ED immediately   Arthralgia Arthralgia is joint pain. A joint is a place where two bones meet. Joint pain can happen for many reasons. The joint can be bruised, stiff, infected, or weak from aging. Pain usually goes away after resting and taking medicine for soreness.  HOME CARE  Rest the joint as told by your doctor.  Keep the sore joint raised (elevated) for the first 24 hours.  Put ice on the joint area.  Put ice in a plastic bag.  Place a towel between your skin and the bag.  Leave the ice on for 15-20 minutes, 03-04 times a day.  Wear your splint, casting, elastic bandage, or sling as told by your doctor.  Only take medicine as told by your doctor. Do not take aspirin.  Use crutches as told by your doctor. Do not put weight on the joint until told to by your doctor. GET HELP RIGHT AWAY IF:   You  have bruising, puffiness (swelling), or more pain.  Your fingers or toes turn blue or start to lose feeling (numb).  Your medicine does not lessen the pain.  Your pain becomes severe.  You have a temperature by mouth above 102 F (38.9 C), not controlled by medicine.  You cannot move or use the joint. MAKE SURE YOU:   Understand these instructions.  Will watch your condition.  Will get help right away if you are not doing well or get worse. Document Released: 04/18/2009 Document Revised: 07/23/2011 Document Reviewed: 04/18/2009 [facility] Patient Information 2015 [place], [place]. This information is not intended to replace advice given to you by your health care provider. Make sure you discuss any questions you have with your health care provider.

## 2013-11-09 NOTE — ED Notes (Signed)
Doppler study in progress 

## 2013-11-09 NOTE — ED Notes (Signed)
Patient reports she has had pain in left knee for about a month now. Has been seen for same. Awaiting for referral to Orthopedics. Pain started to increase yesterday. Patient reports the are feeling "hot". Patient took ibuprofen this am around 9am with no pain relief. Patient denies recent injury.

## 2013-11-09 NOTE — ED Provider Notes (Signed)
CSN: [id number]     Arrival date & time 11/09/13  1152 History  This chart was scribed for non-physician practitioner Raymon Mutton, PA-C, working with Shanna Cisco, MD, by Yevette Edwards, ED Scribe. This patient was seen in room WTR7/WTR7 and the patient's care was started at 4:09 PM.   First MD Initiated Contact with Patient 11/09/13 1510     Chief Complaint  Patient presents with  . Knee Pain    HPI HPI Comments: Danielle Holden is a 43 y.o. female, with a h/o arthritis and Lupus, who presents to the Emergency Department complaining of right posterior knee pain which has persisted for approximately a month, but which increased yesterday. She characterizes the pain as "hot and tingling," and she states the pain is increased with pressure, bending, and weight-bearing. Additionally she reports the pain radiates up her thigh and into her hip. The pt also reports the affected site has also been associated with muscle cramps and a bruise.  Ms. Hubert also endorses a fever of 100.1 F yesterday evening; in the ED her temperature is 98.7 F. She has used IBU without relief of the pain. She denies recent falls/injuries or extended travels. The pt has been treated in the ED for swelling to the right knee, and she reports baseline joint swelling for which she is treated by a rheumatologist. She has not been treated by an orthopedic.   Ms. Benegas states she tested positive for Lupus in [place], but she reports the tests performed in [place] were negative.   Past Medical History  Diagnosis Date  . Anxiety   . Arthritis   . Anemia   . Lupus   . Hypoglycemia    Past Surgical History  Procedure Laterality Date  . Cesarean section    . Appendectomy    . Tubal ligation     Family History  Problem Relation Age of Onset  . Cancer Maternal Grandmother   . Cancer Paternal Grandmother   . Suicidality Father    History  Substance Use Topics  . Smoking status: Former Games developer  .  Smokeless tobacco: Never Used     Comment: Quit in 1989  . Alcohol Use: No   OB History   Grav Para Term Preterm Abortions TAB SAB Ect Mult Living   4 3 3  1  1   3      Review of Systems  Constitutional: Positive for fever.  Musculoskeletal: Positive for arthralgias and joint swelling.  Skin: Positive for color change.  All other systems reviewed and are negative.   Allergies  Trazodone and nefazodone and Anesthetics, amide  Home Medications   Prior to Admission medications   Medication Sig Start Date End Date Taking? Authorizing Provider  fluticasone (FLONASE) 50 MCG/ACT nasal spray Place 1 spray into both nostrils daily as needed for allergies or rhinitis.   Yes Historical Provider, MD  ibuprofen (ADVIL,MOTRIN) 200 MG tablet Take 600 mg by mouth every 6 (six) hours as needed for mild pain or moderate pain.   Yes Historical Provider, MD  Norethindrone Acetate-Ethinyl Estrad-FE (LOMEDIA 24 FE) 1-20 MG-MCG(24) tablet Take 1 tablet by mouth at bedtime.    Yes Historical Provider, MD  oxyCODONE-acetaminophen (PERCOCET/ROXICET) 5-325 MG per tablet Take 1 tablet by mouth every 8 (eight) hours as needed for moderate pain or severe pain. 11/09/13   Marissa Sciacca, PA-C   Triage Vitals: BP 123/87  Pulse 69  Temp(Src) 98.7 F (37.1 C) (Oral)  Resp 15  SpO2 100%  LMP 10/26/2013  Physical Exam  Nursing note and vitals reviewed. Constitutional: She is oriented to person, place, and time. She appears well-developed and well-nourished. No distress.  HENT:  Head: Normocephalic and atraumatic.  Mouth/Throat: Oropharynx is clear and moist. No oropharyngeal exudate.  Eyes: Conjunctivae and EOM are normal. Pupils are equal, round, and reactive to light. Right eye exhibits no discharge. Left eye exhibits no discharge.  Neck: Normal range of motion. Neck supple. No tracheal deviation present.  Cardiovascular: Normal rate, regular rhythm and normal heart sounds.  Exam reveals no friction rub.    No murmur heard. Pulmonary/Chest: Effort normal and breath sounds normal. No respiratory distress. She has no wheezes. She has no rales.  Musculoskeletal: She exhibits edema and tenderness.       Right knee: She exhibits decreased range of motion (flexion secondary to pain ), swelling and ecchymosis (posterior aspect ). She exhibits no effusion, no deformity, no laceration, no erythema, normal alignment, no LCL laxity, no bony tenderness, normal meniscus and no MCL laxity. Tenderness (posterior aspect of the right knee) found.       Legs: Mild swelling noted to the right knee circumferentially with discomfort upon palpation to the posterior aspect of the right knee. Decreased ROM to the right knee secondary to pain. Negative erythema, inflammation, lesions, sores, deformities, malalignments noted to the right knee. Patient able to plantar flex and dorsiflex the right ankle without difficulty. Full ROM to the right hip without difficulty or ataxia noted. Mild ecchymosis noted to the posterior aspect of the right knee.   Lymphadenopathy:    She has no cervical adenopathy.  Neurological: She is alert and oriented to person, place, and time. No cranial nerve deficit. She exhibits normal muscle tone. Coordination normal.  Cranial nerves III-XII grossly intact Strength 5+/5+ to lower extremities bilaterally with resistance applied, equal distribution noted Strength intact to the digits of the feet bilaterally with equal strength noted Sensation intact with differentiation to sharp and dull touch   Skin: Skin is warm and dry. No rash noted. No erythema.  Psychiatric: She has a normal mood and affect. Her behavior is normal.    ED Course  Procedures (including critical care time)  DIAGNOSTIC STUDIES: Oxygen Saturation is 100% on room air, normal by my interpretation.    COORDINATION OF CARE:  4:18 PM- Discussed treatment plan with patient, and the patient agreed to the plan. The plan includes  imaging and a doppler.   4:18 PM- Consult with Dr. Micheline Maze re the pt's care.   4:33 PM- Dr. Micheline Maze assessed the pt.   4:38 PM patient seen and assessed by attending physician, Dr. Nonda Lou - agreed to doppler to rule out possible baker's cyst. Did not recommend blood work - does not believe to be septic joint.   Labs Review Labs Reviewed - No data to display  Imaging Review Dg Knee Complete 4 Views Right  11/09/2013   CLINICAL DATA:  Right knee pain  EXAM: RIGHT KNEE - COMPLETE 4+ VIEW  COMPARISON:  None.  FINDINGS: There is no evidence of fracture, dislocation, or joint effusion. There is no evidence of arthropathy or other focal bone abnormality. Soft tissues are unremarkable.  IMPRESSION: Negative.   Electronically Signed   By: Elige Ko   On: 11/09/2013 15:59     EKG Interpretation None      MDM   Final diagnoses:  Right knee pain   Medications  oxyCODONE-acetaminophen (PERCOCET/ROXICET) 5-325 MG per tablet  1 tablet (1 tablet Oral Given 11/09/13 1702)    Filed Vitals:   11/09/13 1231 11/09/13 1513  BP: 123/87   Pulse: 82 69  Temp: 98.7 F (37.1 C)   TempSrc: Oral   Resp: 15   SpO2: 100% 100%   I personally performed the services described in this documentation, which was scribed in my presence. The recorded information has been reviewed and is accurate.  Plain film of right knee-negative acute osseous injuries identified-no evidence of fracture, dislocation or joint effusion. No evidence of arthropathy or focal bone abnormality. Doppler of right lower extremity negative for SVT, DVT, Baker's cyst. Patient seen and assessed by attending physician, Dr. Nonda Lou who recommended patient to be discharged home with pain medications and for patient to follow-up with orthopedics and rheumatology.  Doubt gout. Doubt septic joint. Doubt compartment syndrome. Doubt ischemia. Cannot rule out possible tendinitis of the right knee, possible lupus flare up. Definitive  etiology of knee pain unknown. Negative focal neurological deficits noted. Pulses palpable and strong-DP and PT. Imaging unremarkable. Patient stable, afebrile. Discharged patient. Discussed with patient to rest, ice, elevate. Referred patient to PCP, orthopedics and dermatologist. Discussed with patient to closely monitor symptoms and if symptoms are to worsen or change to report back to the ED - strict return instructions given.  Patient agreed to plan of care, understood, all questions answered.   Raymon Mutton, PA-C 11/09/13 2121

## 2013-11-09 NOTE — Progress Notes (Signed)
VASCULAR LAB PRELIMINARY  PRELIMINARY  PRELIMINARY  PRELIMINARY  Right lower extremity venous Doppler completed.    Preliminary report:  There is no DVT or SVT noted in the right lower extremity.  Corderro Koloski, RVT 11/09/2013, 6:11 PM

## 2013-11-10 NOTE — ED Provider Notes (Signed)
Medical screening examination/treatment/procedure(s) were conducted as a shared visit with non-physician practitioner(s) and myself.  I personally evaluated the patient during the encounter. Pt presents w/ R posterior knee pain for about 1 month w/o known injury. XR knee nml. [place] nml. Given no significant effusion, fever, erythema, doubt septic arthritis.  Will rec ortho f/u.    EKG Interpretation None        Shanna Cisco, MD 11/10/13 0002

## 2013-12-04 ENCOUNTER — Other Ambulatory Visit: Payer: Self-pay | Admitting: Orthopedic Surgery

## 2013-12-04 DIAGNOSIS — M25561 Pain in right knee: Secondary | ICD-10-CM

## 2013-12-04 DIAGNOSIS — R609 Edema, unspecified: Secondary | ICD-10-CM

## 2013-12-14 ENCOUNTER — Ambulatory Visit
Admission: RE | Admit: 2013-12-14 | Discharge: 2013-12-14 | Disposition: A | Discharge status: Home / Self Care | Payer: Medicaid Other | Source: Ambulatory Visit | Attending: Orthopedic Surgery | Admitting: Orthopedic Surgery

## 2013-12-14 DIAGNOSIS — R609 Edema, unspecified: Secondary | ICD-10-CM

## 2013-12-14 DIAGNOSIS — M25561 Pain in right knee: Secondary | ICD-10-CM

## 2013-12-22 ENCOUNTER — Telehealth: Payer: Self-pay | Admitting: Neurology

## 2013-12-22 DIAGNOSIS — M25561 Pain in right knee: Secondary | ICD-10-CM | POA: Insufficient documentation

## 2013-12-22 NOTE — Telephone Encounter (Signed)
Called pt and left message asking her to give our office a call back for a sooner appt with Larita Fife, NP, instead of Eber Jones, NP.

## 2013-12-23 ENCOUNTER — Ambulatory Visit (HOSPITAL_COMMUNITY): Admission: RE | Admit: 2013-12-23 | Payer: Medicaid Other | Source: Ambulatory Visit | Admitting: Gastroenterology

## 2013-12-23 ENCOUNTER — Encounter (HOSPITAL_COMMUNITY): Admission: RE | Payer: Self-pay | Source: Ambulatory Visit

## 2013-12-23 SURGERY — COLONOSCOPY WITH PROPOFOL
Anesthesia: Monitor Anesthesia Care

## 2013-12-23 NOTE — Telephone Encounter (Signed)
Pt called back and pt was resch with Lynn,NP on 01/06/14 and the appt with Eber Jones, NP was cancelled. I advised the pt that if she has any other problems, questions or concerns to call the office. Pt verbalized understanding.

## 2014-01-06 ENCOUNTER — Telehealth: Payer: Self-pay | Admitting: Nurse Practitioner

## 2014-01-06 ENCOUNTER — Ambulatory Visit: Payer: Self-pay | Admitting: Nurse Practitioner

## 2014-01-06 NOTE — Telephone Encounter (Signed)
Patient was no show for today's office appointment. Same day cancellation, no transportation per patient.

## 2014-01-20 ENCOUNTER — Encounter (INDEPENDENT_AMBULATORY_CARE_PROVIDER_SITE_OTHER): Payer: Self-pay

## 2014-01-20 ENCOUNTER — Encounter: Payer: Self-pay | Admitting: Nurse Practitioner

## 2014-01-20 ENCOUNTER — Ambulatory Visit (INDEPENDENT_AMBULATORY_CARE_PROVIDER_SITE_OTHER): Payer: Medicaid Other | Admitting: Nurse Practitioner

## 2014-01-20 VITALS — BP 113/71 | HR 74 | Ht 63.75 in | Wt 139.0 lb

## 2014-01-20 DIAGNOSIS — G43909 Migraine, unspecified, not intractable, without status migrainosus: Secondary | ICD-10-CM

## 2014-01-20 DIAGNOSIS — F411 Generalized anxiety disorder: Secondary | ICD-10-CM

## 2014-01-20 MED ORDER — VENLAFAXINE HCL ER 37.5 MG PO CP24: 37.5000 mg | ORAL_CAPSULE | Freq: Every day | ORAL | Status: DC

## 2014-01-20 MED ORDER — RIZATRIPTAN BENZOATE 10 MG PO TBDP: 10.0000 mg | ORAL_TABLET | ORAL | Status: DC | PRN

## 2014-01-20 NOTE — Progress Notes (Signed)
PATIENT: Danielle Holden DOB: 09-27-1970  REASON FOR VISIT: routine follow up for Migraine, extremity weakness HISTORY FROM: patient  HISTORY OF PRESENT ILLNESS: Danielle Holden is a 43 years old female, on at today's clinical visit referred by her primary care physician Jackson South for evaluation of weakness at the right upper extremity.  She recently moved from [facility], is on disability due to normal disorder, also had a history of mild anemia, due to heavy menstrual bleeding, has improved with contraceptive, and iron pills,  She had past medical history of migraine headaches, still getting 3 migraines in one week, her typical migraines right side retro-orbital area severe pounding headache was associated light noise sensitivity, lasting 3-4 hours, improved by sleeping dark quiet room. She also complains of right arm and leg weakness, intermittent since April 2014, seems to getting worse, sometimes with severe headaches she also has blurry vision, slurred speech, mild gait difficulty due to right knee pain.  She carried a diagnosis of lupus per patient based on previous serology.  Recent MRI of the brain February 2015 with without contrast revealed that was normal, MRA of the brain was normal next left laboratory showed normal CBC, CMP.  Update 01/20/14 (LL):  No show on 01/06/14.  Since last visit, she tried Nortriptyline but had an allergic reaction, hives, and discontinued. She started other medications at the same time, so she is unsure which caused the allergy. She did not get Maxalt filled. She states she is very sensitive to medications. Headaches are worse she states, throbbing pain, the whole 2 weeks before her period, has been worse since on oral birth control pills. 3-5 days per week now. She does not sleep well. States her mind is always racing. Symptoms of right sided numbness continues, states she veers to the right when walking.   REVIEW OF SYSTEMS: Full 14 system review  of systems performed and notable only for: fatigue, ear pain, runny nose, trouble swallowing, double vision, rectal bleeding, nausea, insomnia, joint pain, joint swelling, back pain, dizziness, headache, numbness, depression, food allergies.  ALLERGIES: Allergies  Allergen Reactions  . Trazodone And Nefazodone Anaphylaxis    Tongue swells.  . Anesthetics, Amide Other (See Comments)    Heart stopped and stopped breathing    HOME MEDICATIONS: Outpatient Prescriptions Prior to Visit  Medication Sig Dispense Refill  . fluticasone (FLONASE) 50 MCG/ACT nasal spray Place 1 spray into both nostrils daily as needed for allergies or rhinitis.      Marland Kitchen ibuprofen (ADVIL,MOTRIN) 200 MG tablet Take 600 mg by mouth every 6 (six) hours as needed for mild pain or moderate pain.      . Norethindrone Acetate-Ethinyl Estrad-FE (LOMEDIA 24 FE) 1-20 MG-MCG(24) tablet Take 1 tablet by mouth at bedtime.       Marland Kitchen oxyCODONE-acetaminophen (PERCOCET/ROXICET) 5-325 MG per tablet Take 1 tablet by mouth every 8 (eight) hours as needed for moderate pain or severe pain.  9 tablet  0   No facility-administered medications prior to visit.    PHYSICAL EXAM Filed Vitals:   01/20/14 1113  BP: 113/71  Pulse: 74  Height: 5' 3.75" (1.619 m)  Weight: 139 lb (63.05 kg)   Body mass index is 24.05 kg/(m^2). No exam data present No flowsheet data found.  No flowsheet data found.   Generalized: Well developed, in no acute distress  Head: normocephalic and atraumatic. Oropharynx benign  Neck: Supple, no carotid bruits  Cardiac: Regular rate rhythm, no murmur  Musculoskeletal: No  deformity   Neurological examination  Mentation: Alert oriented to time, place, history taking. Follows all commands speech and language fluent Cranial nerve II-XII: Fundoscopic exam not done. Pupils were equal round reactive to light extraocular movements were full, visual field were full on confrontational test. Facial sensation and strength  were normal. hearing was intact to finger rubbing bilaterally. Uvula tongue midline. head turning and shoulder shrug and were normal and symmetric.Tongue protrusion into cheek strength was normal. Motor: The motor testing reveals 5 over 5 strength of all 4 extremities. Good symmetric motor tone is noted throughout.  Sensory: Sensory testing is intact to soft touch on all 4 extremities. No evidence of extinction is noted.  Coordination: Cerebellar testing reveals good finger-nose-finger and heel-to-shin bilaterally.  Gait and station: Gait is normal. Tandem gait is normal. Romberg is negative. Reflexes: Deep tendon reflexes are symmetric and normal bilaterally.   DIAGNOSTIC DATA (LABS, IMAGING, TESTING) - I reviewed patient records, labs, notes, testing and imaging myself where available.  Lab Results  Component Value Date   WBC 8.5 08/19/2013   HGB 14.6 08/19/2013   HCT 43.0 08/19/2013   MCV 75.9* 08/19/2013   PLT 261 08/19/2013      Component Value Date/Time   NA 140 08/19/2013 1543   K 3.7 08/19/2013 1543   CL 102 08/19/2013 1543   CO2 21 07/13/2013 1343   GLUCOSE 76 08/19/2013 1543   BUN 8 08/19/2013 1543   CREATININE 0.80 08/19/2013 1543   CREATININE 0.62 07/13/2013 1343   CALCIUM 8.5 07/13/2013 1343   PROT 6.8 07/13/2013 1343   ALBUMIN 4.2 07/13/2013 1343   AST 15 07/13/2013 1343   ALT 10 07/13/2013 1343   ALKPHOS 59 07/13/2013 1343   BILITOT 0.3 07/13/2013 1343   GFRNONAA >90 06/27/2013 1816   GFRAA >90 06/27/2013 1816   Lab Results  Component Value Date   CHOL 125 06/28/2013   HDL 61 06/28/2013   LDLCALC 57 06/28/2013   TRIG 34 06/28/2013   CHOLHDL 2.0 06/28/2013   Lab Results  Component Value Date   HGBA1C 5.3 06/28/2013   Lab Results  Component Value Date   VITAMINB12 380 06/28/2013   Lab Results  Component Value Date   TSH 1.598 07/13/2013   Lab Results  Component Value Date   ESRSEDRATE 20 06/29/2013    ASSESSMENT: Danielle Holden is a 43 y.o. female complains of frequent headaches, with  migraine features, essentially normal neurological examination, normal MRI of the brain with and without contrast, normal MRA of the brain   PLAN: Start Venlafaxine (Effexor) 37.5 capsules, take 1 daily for 30 days then increase to 2 capsules daily for Migraine prevention.  You may try the Maxalt to abort Migraine, take it as soon as the headache starts, may repeat x 1 after 2 hours, no one than 2 tablets in 24 hours.  Otherwise, use ibuprofen as needed, try not to take anything for mild headaches.  Talk to your GYN about procedures for excessive uterine bleeding (Novasure), to get off birth control pills.  They are making your headaches worse.  Follow up in 2 months with Dr. Terrace Arabia, sooner as needed.  Meds ordered this encounter  Medications  . venlafaxine XR (EFFEXOR XR) 37.5 MG 24 hr capsule    Sig: Take 1 capsule (37.5 mg total) by mouth daily with breakfast. After 1 month increase to 2 daily.    Dispense:  60 capsule    Refill:  2    Order Specific  Question:  Supervising Provider    Answer:  Levert Feinstein [[id number]]  . rizatriptan (MAXALT-MLT) 10 MG disintegrating tablet    Sig: Take 1 tablet (10 mg total) by mouth as needed for migraine. May repeat in 2 hours if needed    Dispense:  12 tablet    Refill:  11    Order Specific Question:  Supervising Provider    Answer:  Levert Feinstein [[id number]]   Return in about 2 months (around 03/22/2014) for Migraine, right sided numbness.  Tawny Asal Burkley Dech, MSN, FNP-BC, A/GNP-C 01/20/2014, 12:51 PM [facility] [address] [place], [place] [address] [phone]  Note: This document was prepared with digital dictation and possible smart phrase technology. Any transcriptional errors that result from this process are unintentional.

## 2014-01-20 NOTE — Patient Instructions (Addendum)
Start Venlafaxine (Effexor) 37.5 capsules, take 1 daily for 30 days then increase to 2 capsules daily.  You may try the Maxalt for Migraine, take it as soon as the headache starts, may repeat x 1 after 2 hours, no one than 2 tablets in 24 hours.  Otherwise, use ibuprofen as needed, try not to take anything for mild headaches.  Talk to your GYN about procedures for excessive uterine bleeding, to get off birth control pills.  They are making your headaches worse.  Follow up in 2 months with Dr. Terrace Arabia, sooner as needed.

## 2014-01-27 ENCOUNTER — Encounter: Payer: Self-pay | Admitting: Obstetrics

## 2014-01-27 ENCOUNTER — Ambulatory Visit (INDEPENDENT_AMBULATORY_CARE_PROVIDER_SITE_OTHER): Payer: Medicaid Other | Admitting: Obstetrics

## 2014-01-27 VITALS — Temp 98.5°F | Ht 63.0 in | Wt 138.0 lb

## 2014-01-27 DIAGNOSIS — Z3009 Encounter for other general counseling and advice on contraception: Secondary | ICD-10-CM

## 2014-01-27 DIAGNOSIS — Z3041 Encounter for surveillance of contraceptive pills: Secondary | ICD-10-CM

## 2014-01-27 DIAGNOSIS — Z8669 Personal history of other diseases of the nervous system and sense organs: Secondary | ICD-10-CM

## 2014-01-27 DIAGNOSIS — N939 Abnormal uterine and vaginal bleeding, unspecified: Secondary | ICD-10-CM

## 2014-01-27 DIAGNOSIS — N926 Irregular menstruation, unspecified: Secondary | ICD-10-CM

## 2014-01-27 MED ORDER — LEVONORGESTREL 20 MCG/24HR IU IUD: 1.0000 | INTRAUTERINE_SYSTEM | Freq: Once | INTRAUTERINE | Status: DC

## 2014-01-27 NOTE — Progress Notes (Signed)
Subjective:    Danielle Holden is a 43 y.o. female who presents for contraception counseling. The patient has no complaints today. The patient is not currently sexually active. Pertinent past medical history: migraines.  The information documented in the HPI was reviewed and verified.  Menstrual History: OB History   Grav Para Term Preterm Abortions TAB SAB Ect Mult Living   4 3 3  1  1   3        Patient's last menstrual period was 01/18/2014.   Patient Active Problem List   Diagnosis Date Noted  . Migraine, unspecified, without mention of intractable migraine without mention of status migrainosus 09/28/2013  . Mastodynia, female 07/13/2013  . Abnormal uterine bleeding 07/13/2013  . Iron deficiency anemia 06/30/2013  . TIA (transient ischemic attack) 06/27/2013  . Acute right-sided weakness 06/27/2013  . Anemia 06/27/2013  . Menorrhagia 06/27/2013  . Chest pain 06/27/2013   Past Medical History  Diagnosis Date  . Anxiety   . Arthritis   . Anemia   . Lupus   . Hypoglycemia   . Migraine     Past Surgical History  Procedure Laterality Date  . Cesarean section    . Appendectomy    . Tubal ligation      Current outpatient prescriptions:Cyanocobalamin (VITAMIN B-12 PO), Take by mouth daily., Disp: , Rfl: ;  fluticasone (FLONASE) 50 MCG/ACT nasal spray, Place 1 spray into both nostrils daily as needed for allergies or rhinitis., Disp: , Rfl: ;  ibuprofen (ADVIL,MOTRIN) 200 MG tablet, Take 600 mg by mouth every 6 (six) hours as needed for mild pain or moderate pain., Disp: , Rfl:  Norethindrone Acetate-Ethinyl Estrad-FE (LOMEDIA 24 FE) 1-20 MG-MCG(24) tablet, Take 1 tablet by mouth at bedtime. , Disp: , Rfl: ;  pseudoephedrine (SUDAFED) 120 MG 12 hr tablet, Take 120 mg by mouth as needed for congestion., Disp: , Rfl: ;  rizatriptan (MAXALT-MLT) 10 MG disintegrating tablet, Take 1 tablet (10 mg total) by mouth as needed for migraine. May repeat in 2 hours if needed, Disp: 12 tablet,  Rfl: 11 venlafaxine XR (EFFEXOR XR) 37.5 MG 24 hr capsule, Take 1 capsule (37.5 mg total) by mouth daily with breakfast. After 1 month increase to 2 daily., Disp: 60 capsule, Rfl: 2 Allergies  Allergen Reactions  . Trazodone And Nefazodone Anaphylaxis    Tongue swells.  . Anesthetics, Amide Other (See Comments)    Heart stopped and stopped breathing    History  Substance Use Topics  . Smoking status: Never Smoker   . Smokeless tobacco: Never Used  . Alcohol Use: No    Family History  Problem Relation Age of Onset  . Cancer Maternal Grandmother   . Cancer Paternal Grandmother   . Suicidality Father        Review of Systems Constitutional: negative for weight loss Genitourinary:negative for abnormal menstrual periods and vaginal discharge Neuro: Positive for migraines  Objective:   Temp(Src) 98.5 F (36.9 C)  Ht 5\' 3"  (1.6 m)  Wt 138 lb (62.596 kg)  BMI 24.45 kg/m2  LMP 01/18/2014  PE:  Deferred  Lab Review Urine pregnancy test Labs reviewed yes Radiologic studies reviewed no  100% of 10 min visit spent on counseling and coordination of care.   Assessment:    43 y.o., discontinuing OCP (estrogen/progesterone), no contraindications.   Plan:    All questions answered. Contraception: IUD. Discussed healthy lifestyle modifications. 03/20/2014 distributed. Follow up for IUD insertion with menses.  No orders of the  defined types were placed in this encounter.   No orders of the defined types were placed in this encounter.

## 2014-02-09 ENCOUNTER — Ambulatory Visit: Payer: Medicaid Other | Admitting: Nurse Practitioner

## 2014-02-11 ENCOUNTER — Encounter: Payer: Self-pay | Admitting: Obstetrics

## 2014-02-11 ENCOUNTER — Ambulatory Visit (INDEPENDENT_AMBULATORY_CARE_PROVIDER_SITE_OTHER): Payer: Medicaid Other | Admitting: Obstetrics

## 2014-02-11 VITALS — BP 117/75 | HR 75 | Temp 98.3°F | Ht 63.0 in | Wt 138.0 lb

## 2014-02-11 DIAGNOSIS — Z3202 Encounter for pregnancy test, result negative: Secondary | ICD-10-CM

## 2014-02-11 DIAGNOSIS — Z30430 Encounter for insertion of intrauterine contraceptive device: Secondary | ICD-10-CM

## 2014-02-11 LAB — POCT URINE PREGNANCY: PREG TEST UR: NEGATIVE

## 2014-02-11 NOTE — Addendum Note (Signed)
Addended by: Marya Landry D on: 02/11/2014 06:23 PM   Modules accepted: Orders

## 2014-02-11 NOTE — Progress Notes (Signed)
IUD Insertion Procedure Note  Pre-operative Diagnosis: Desire contraception  Post-operative Diagnosis: same  Indications: contraception  Procedure Details  Urine pregnancy test was done in office and result was negative.  The risks (including infection, bleeding, pain, and uterine perforation) and benefits of the procedure were explained to the patient and Written informed consent was obtained.    Cervix cleansed with Betadine. Uterus sounded to 7 cm. IUD inserted without difficulty. String visible and trimmed. Patient tolerated procedure well.  IUD Information: Mirena, Lot # [id number], Expiration date 08 / 17.  Condition: Stable  Complications: None  Plan:  The patient was advised to call for any fever or for prolonged or severe pain or bleeding. She was advised to use NSAID as needed for mild to moderate pain.   Attending Physician Documentation: I was present for or participated in the entire procedure, including opening and closing.

## 2014-02-13 LAB — GC/CHLAMYDIA PROBE AMP
CT Probe RNA: NEGATIVE
GC Probe RNA: NEGATIVE

## 2014-02-15 LAB — WET PREP BY MOLECULAR PROBE
CANDIDA SPECIES: NEGATIVE
Gardnerella vaginalis: NEGATIVE
Trichomonas vaginosis: NEGATIVE

## 2014-03-15 ENCOUNTER — Encounter: Payer: Self-pay | Admitting: Obstetrics

## 2014-03-24 ENCOUNTER — Emergency Department (HOSPITAL_COMMUNITY)
Admission: EM | Admit: 2014-03-24 | Discharge: 2014-03-24 | Disposition: A | Discharge status: Home / Self Care | Payer: Medicaid Other | Attending: Emergency Medicine | Admitting: Emergency Medicine

## 2014-03-24 ENCOUNTER — Encounter (HOSPITAL_COMMUNITY): Payer: Self-pay

## 2014-03-24 ENCOUNTER — Ambulatory Visit: Payer: Medicaid Other | Admitting: Neurology

## 2014-03-24 DIAGNOSIS — R21 Rash and other nonspecific skin eruption: Secondary | ICD-10-CM | POA: Diagnosis not present

## 2014-03-24 DIAGNOSIS — L539 Erythematous condition, unspecified: Secondary | ICD-10-CM | POA: Insufficient documentation

## 2014-03-24 DIAGNOSIS — K625 Hemorrhage of anus and rectum: Secondary | ICD-10-CM | POA: Insufficient documentation

## 2014-03-24 DIAGNOSIS — Z79899 Other long term (current) drug therapy: Secondary | ICD-10-CM | POA: Insufficient documentation

## 2014-03-24 DIAGNOSIS — Z8739 Personal history of other diseases of the musculoskeletal system and connective tissue: Secondary | ICD-10-CM | POA: Diagnosis not present

## 2014-03-24 DIAGNOSIS — Z8639 Personal history of other endocrine, nutritional and metabolic disease: Secondary | ICD-10-CM | POA: Insufficient documentation

## 2014-03-24 DIAGNOSIS — Z793 Long term (current) use of hormonal contraceptives: Secondary | ICD-10-CM | POA: Diagnosis not present

## 2014-03-24 DIAGNOSIS — L299 Pruritus, unspecified: Secondary | ICD-10-CM | POA: Insufficient documentation

## 2014-03-24 DIAGNOSIS — G43909 Migraine, unspecified, not intractable, without status migrainosus: Secondary | ICD-10-CM | POA: Insufficient documentation

## 2014-03-24 DIAGNOSIS — F419 Anxiety disorder, unspecified: Secondary | ICD-10-CM | POA: Insufficient documentation

## 2014-03-24 DIAGNOSIS — T7840XA Allergy, unspecified, initial encounter: Secondary | ICD-10-CM

## 2014-03-24 MED ORDER — DIPHENHYDRAMINE HCL 25 MG PO CAPS
25.0000 mg | ORAL_CAPSULE | Freq: Once | ORAL | Status: AC
  Administered 2014-03-24: 25 mg via ORAL
  Filled 2014-03-24: qty 1

## 2014-03-24 MED ORDER — PREDNISONE 20 MG PO TABS
20.0000 mg | ORAL_TABLET | Freq: Once | ORAL | Status: DC
  Filled 2014-03-24: qty 1

## 2014-03-24 MED ORDER — PREDNISONE 10 MG PO TABS: 20.0000 mg | ORAL_TABLET | Freq: Every day | ORAL | Status: DC

## 2014-03-24 MED ORDER — PREDNISONE 20 MG PO TABS
20.0000 mg | ORAL_TABLET | Freq: Once | ORAL | Status: AC
  Administered 2014-03-24: 20 mg via ORAL

## 2014-03-24 NOTE — ED Provider Notes (Signed)
CSN: [id number]     Arrival date & time 03/24/14  2038 History   First MD Initiated Contact with Patient 03/24/14 2222     Chief Complaint  Patient presents with  . Allergic Reaction      HPI  Patient evaluation of "hot red skin". She was sitting at home. States her palms with her itching and burning area did notice erythema of her palm. Progressed to her trunk legs and arms and back. States her face felt like he was burning her ears were hot. No difficult breathing or throat swelling. No new medications. No bites or stings. No new foods or exposures. History of allergic reactions. History of colitis. States she is "supposed to be getting a colonoscopy". Intermittent bleeding over the last several weeks. Trace amount today.  Past Medical History  Diagnosis Date  . Anxiety   . Arthritis   . Anemia   . Lupus   . Hypoglycemia   . Migraine    Past Surgical History  Procedure Laterality Date  . Cesarean section    . Appendectomy    . Tubal ligation     Family History  Problem Relation Age of Onset  . Cancer Maternal Grandmother   . Cancer Paternal Grandmother   . Suicidality Father    History  Substance Use Topics  . Smoking status: Never Smoker   . Smokeless tobacco: Never Used  . Alcohol Use: No   OB History    Gravida Para Term Preterm AB TAB SAB Ectopic Multiple Living   4 3 3  1  1   3      Review of Systems  Constitutional: Negative for fever, chills, diaphoresis, appetite change and fatigue.  HENT: Negative for mouth sores, sore throat and trouble swallowing.   Eyes: Negative for visual disturbance.  Respiratory: Negative for cough, chest tightness, shortness of breath and wheezing.   Cardiovascular: Negative for chest pain.  Gastrointestinal: Positive for anal bleeding. Negative for nausea, vomiting, abdominal pain, diarrhea and abdominal distention.  Endocrine: Negative for polydipsia, polyphagia and polyuria.  Genitourinary: Negative for dysuria, frequency  and hematuria.  Musculoskeletal: Negative for gait problem.  Skin: Positive for rash. Negative for color change and pallor.  Neurological: Negative for dizziness, syncope, light-headedness and headaches.  Hematological: Does not bruise/bleed easily.  Psychiatric/Behavioral: Negative for behavioral problems and confusion.      Allergies  Trazodone and nefazodone and Anesthetics, amide  Home Medications   Prior to Admission medications   Medication Sig Start Date End Date Taking? Authorizing Provider  Cyanocobalamin (VITAMIN B-12 PO) Place 1 drop under the tongue daily.    Yes Historical Provider, MD  ibuprofen (ADVIL,MOTRIN) 200 MG tablet Take 600 mg by mouth every 6 (six) hours as needed for mild pain or moderate pain (pain).    Yes Historical Provider, MD  loratadine (CLARITIN) 10 MG tablet Take 10 mg by mouth daily as needed for allergies (allergies).   Yes Historical Provider, MD  naproxen sodium (ANAPROX) 220 MG tablet Take 220 mg by mouth 2 (two) times daily as needed (pain).   Yes Historical Provider, MD  pseudoephedrine (SUDAFED) 120 MG 12 hr tablet Take 120 mg by mouth daily as needed for congestion (congestion).    Yes Historical Provider, MD  rizatriptan (MAXALT-MLT) 10 MG disintegrating tablet Take 1 tablet (10 mg total) by mouth as needed for migraine. May repeat in 2 hours if needed 01/20/14  Yes 03/22/14, NP  fluticasone (FLONASE) 50 MCG/ACT nasal spray Place  1 spray into both nostrils daily as needed for allergies or rhinitis.    Historical Provider, MD  levonorgestrel (MIRENA) 20 MCG/24HR IUD 1 Intra Uterine Device (1 each total) by Intrauterine route once. 01/27/14   Brock Bad, MD  Norethindrone Acetate-Ethinyl Estrad-FE (LOMEDIA 24 FE) 1-20 MG-MCG(24) tablet Take 1 tablet by mouth at bedtime.     Historical Provider, MD  predniSONE (DELTASONE) 10 MG tablet Take 2 tablets (20 mg total) by mouth daily. 03/24/14   Rolland Porter, MD  venlafaxine XR (EFFEXOR XR) 37.5 MG 24  hr capsule Take 1 capsule (37.5 mg total) by mouth daily with breakfast. After 1 month increase to 2 daily. 01/20/14   Ronal Fear, NP   BP 140/91 mmHg  Pulse 73  Temp(Src) 98.7 F (37.1 C) (Oral)  Resp 18  Ht 5\' 3"  (1.6 m)  Wt 138 lb (62.596 kg)  BMI 24.45 kg/m2  SpO2 100%  LMP 03/18/2014 Physical Exam  Constitutional: She is oriented to person, place, and time. She appears well-developed and well-nourished. No distress.  HENT:  Head: Normocephalic.  No posterior pharyngeal swelling. No stridor. Clear lungs.  Eyes: Conjunctivae are normal. Pupils are equal, round, and reactive to light. No scleral icterus.  Conjunctiva not pale.  Neck: Normal range of motion. Neck supple. No thyromegaly present.  Cardiovascular: Normal rate and regular rhythm.  Exam reveals no gallop and no friction rub.   No murmur heard. Pulmonary/Chest: Effort normal and breath sounds normal. No respiratory distress. She has no wheezes. She has no rales.  Abdominal: Soft. Bowel sounds are normal. She exhibits no distension. There is no tenderness. There is no rebound.  Musculoskeletal: Normal range of motion.  Neurological: She is alert and oriented to person, place, and time.  Skin: Skin is warm and dry. No rash noted.  No urticaria. No abnormal skin on exam.  Psychiatric: She has a normal mood and affect. Her behavior is normal.    ED Course  Procedures (including critical care time) Labs Review Labs Reviewed - No data to display  Imaging Review No results found.   EKG Interpretation None      MDM   Final diagnoses:  Allergic reaction, initial encounter    Patient a symptomatically with normal exam. Given a dose of prednisone and Benadryl here. We'll continue on each until 24 hours without symptoms.    13/09/2013, MD 03/24/14 2239

## 2014-03-24 NOTE — Discharge Instructions (Signed)
Take prednisone every 12 hours until you have had 24 hours without rash  Return to ER with any worsening, difficulty breathing, throat symptoms.

## 2014-03-24 NOTE — ED Notes (Signed)
Pt complains of a burning rash on her face, neck and around her under garments for about an hour, she states theres nothing different except she's been drinking green tea for a few days. She states the lymph node on the back of her neck was really swollen at first. She continues to complain of her face, lips and ears burning. Pt also complains of rectal bleeding for three days

## 2014-03-25 ENCOUNTER — Ambulatory Visit: Payer: Medicaid Other | Admitting: Obstetrics

## 2014-03-26 ENCOUNTER — Encounter: Payer: Self-pay | Admitting: Obstetrics

## 2014-03-26 ENCOUNTER — Ambulatory Visit (INDEPENDENT_AMBULATORY_CARE_PROVIDER_SITE_OTHER): Payer: Medicaid Other | Admitting: Obstetrics

## 2014-03-26 VITALS — Temp 97.1°F | Ht 63.0 in | Wt 139.0 lb

## 2014-03-26 DIAGNOSIS — Z0000 Encounter for general adult medical examination without abnormal findings: Secondary | ICD-10-CM

## 2014-03-26 DIAGNOSIS — Z30431 Encounter for routine checking of intrauterine contraceptive device: Secondary | ICD-10-CM

## 2014-03-26 MED ORDER — CITRANATAL HARMONY 27-1-260 MG PO CAPS: 1.0000 | ORAL_CAPSULE | Freq: Every day | ORAL | Status: DC

## 2014-03-27 ENCOUNTER — Encounter: Payer: Self-pay | Admitting: Obstetrics

## 2014-03-27 LAB — TSH: TSH: 1.558 u[IU]/mL (ref 0.350–4.500)

## 2014-03-27 LAB — CBC WITH DIFFERENTIAL/PLATELET
Basophils Absolute: 0 10*3/uL (ref 0.0–0.1)
Basophils Relative: 0 % (ref 0–1)
EOS PCT: 4 % (ref 0–5)
Eosinophils Absolute: 0.3 10*3/uL (ref 0.0–0.7)
HEMATOCRIT: 38.4 % (ref 36.0–46.0)
Hemoglobin: 12.5 g/dL (ref 12.0–15.0)
LYMPHS ABS: 2.5 10*3/uL (ref 0.7–4.0)
Lymphocytes Relative: 30 % (ref 12–46)
MCH: 26.6 pg (ref 26.0–34.0)
MCHC: 32.6 g/dL (ref 30.0–36.0)
MCV: 81.7 fL (ref 78.0–100.0)
Monocytes Absolute: 0.8 10*3/uL (ref 0.1–1.0)
Monocytes Relative: 9 % (ref 3–12)
Neutro Abs: 4.8 10*3/uL (ref 1.7–7.7)
Neutrophils Relative %: 57 % (ref 43–77)
PLATELETS: 374 10*3/uL (ref 150–400)
RBC: 4.7 MIL/uL (ref 3.87–5.11)
RDW: 17.3 % — ABNORMAL HIGH (ref 11.5–15.5)
WBC: 8.4 10*3/uL (ref 4.0–10.5)

## 2014-03-27 LAB — COMPREHENSIVE METABOLIC PANEL
ALT: 12 U/L (ref 0–35)
AST: 18 U/L (ref 0–37)
Albumin: 4.2 g/dL (ref 3.5–5.2)
Alkaline Phosphatase: 71 U/L (ref 39–117)
BILIRUBIN TOTAL: 0.5 mg/dL (ref 0.2–1.2)
BUN: 8 mg/dL (ref 6–23)
CALCIUM: 8.6 mg/dL (ref 8.4–10.5)
CHLORIDE: 104 meq/L (ref 96–112)
CO2: 24 meq/L (ref 19–32)
CREATININE: 0.7 mg/dL (ref 0.50–1.10)
GLUCOSE: 64 mg/dL — AB (ref 70–99)
Potassium: 3.7 mEq/L (ref 3.5–5.3)
Sodium: 138 mEq/L (ref 135–145)
Total Protein: 7 g/dL (ref 6.0–8.3)

## 2014-03-27 NOTE — Progress Notes (Signed)
Subjective:    Danielle Holden is a 43 y.o. female who presents for contraception counseling. The patient has no complaints today. The patient is sexually active. Pertinent past medical history: none.  The information documented in the HPI was reviewed and verified.  Menstrual History: OB History    Gravida Para Term Preterm AB TAB SAB Ectopic Multiple Living   4 3 3  1  1   3        Patient's last menstrual period was 03/18/2014.   Patient Active Problem List   Diagnosis Date Noted  . Hx of migraines 01/27/2014  . Migraine, unspecified, without mention of intractable migraine without mention of status migrainosus 09/28/2013  . Mastodynia, female 07/13/2013  . Abnormal uterine bleeding 07/13/2013  . Iron deficiency anemia 06/30/2013  . TIA (transient ischemic attack) 06/27/2013  . Acute right-sided weakness 06/27/2013  . Anemia 06/27/2013  . Menorrhagia 06/27/2013  . Chest pain 06/27/2013   Past Medical History  Diagnosis Date  . Anxiety   . Arthritis   . Anemia   . Lupus   . Hypoglycemia   . Migraine     Past Surgical History  Procedure Laterality Date  . Cesarean section    . Appendectomy    . Tubal ligation      Current outpatient prescriptions: Cyanocobalamin (VITAMIN B-12 PO), Place 1 drop under the tongue daily. , Disp: , Rfl: ;  ibuprofen (ADVIL,MOTRIN) 200 MG tablet, Take 600 mg by mouth every 6 (six) hours as needed for mild pain or moderate pain (pain). , Disp: , Rfl: ;  levonorgestrel (MIRENA) 20 MCG/24HR IUD, 1 Intra Uterine Device (1 each total) by Intrauterine route once., Disp: 1 each, Rfl: 0 loratadine (CLARITIN) 10 MG tablet, Take 10 mg by mouth daily as needed for allergies (allergies)., Disp: , Rfl: ;  naproxen sodium (ANAPROX) 220 MG tablet, Take 220 mg by mouth 2 (two) times daily as needed (pain)., Disp: , Rfl: ;  predniSONE (DELTASONE) 10 MG tablet, Take 2 tablets (20 mg total) by mouth daily., Disp: 6 tablet, Rfl: 0 rizatriptan (MAXALT-MLT) 10 MG  disintegrating tablet, Take 1 tablet (10 mg total) by mouth as needed for migraine. May repeat in 2 hours if needed, Disp: 12 tablet, Rfl: 11;  fluticasone (FLONASE) 50 MCG/ACT nasal spray, Place 1 spray into both nostrils daily as needed for allergies or rhinitis., Disp: , Rfl:  Prenat-FeFmCb-DSS-FA-DHA w/o A (CITRANATAL HARMONY) 27-1-260 MG CAPS, Take 1 capsule by mouth daily before breakfast., Disp: 30 capsule, Rfl: 11;  pseudoephedrine (SUDAFED) 120 MG 12 hr tablet, Take 120 mg by mouth daily as needed for congestion (congestion). , Disp: , Rfl: ;  venlafaxine XR (EFFEXOR XR) 37.5 MG 24 hr capsule, Take 1 capsule (37.5 mg total) by mouth daily with breakfast. After 1 month increase to 2 daily., Disp: 60 capsule, Rfl: 2 Allergies  Allergen Reactions  . Trazodone And Nefazodone Anaphylaxis    Tongue swells.  . Anesthetics, Amide Other (See Comments)    Heart stopped and stopped breathing    History  Substance Use Topics  . Smoking status: Never Smoker   . Smokeless tobacco: Never Used  . Alcohol Use: No    Family History  Problem Relation Age of Onset  . Cancer Maternal Grandmother   . Cancer Paternal Grandmother   . Suicidality Father        Review of Systems Constitutional: negative for weight loss Genitourinary:negative for abnormal menstrual periods and vaginal discharge   Objective:  Temp(Src) 97.1 F (36.2 C)  Ht 5\' 3"  (1.6 m)  Wt 139 lb (63.05 kg)  BMI 24.63 kg/m2  LMP 03/18/2014   General:   alert  Skin:   no rash or abnormalities  Lungs:   clear to auscultation bilaterally  Heart:   regular rate and rhythm, S1, S2 normal, no murmur, click, rub or gallop  Breasts:   normal without suspicious masses, skin or nipple changes or axillary nodes  Abdomen:  normal findings: no organomegaly, soft, non-tender and no hernia  Pelvis:  External genitalia: normal general appearance Urinary system: urethral meatus normal and bladder without fullness, nontender Vaginal:  normal without tenderness, induration or masses Cervix: normal appearance Adnexa: normal bimanual exam Uterus: anteverted and non-tender, normal size   Lab Review Urine pregnancy test Labs reviewed yes Radiologic studies reviewed no    Assessment:    43 y.o., continuing IUD, no contraindications.   Plan:    All questions answered. Follow up as needed. General Health Panel ordered.  Meds ordered this encounter  Medications  . Prenat-FeFmCb-DSS-FA-DHA w/o A (CITRANATAL HARMONY) 27-1-260 MG CAPS    Sig: Take 1 capsule by mouth daily before breakfast.    Dispense:  30 capsule    Refill:  11   Orders Placed This Encounter  Procedures  . CBC with Differential  . Comprehensive metabolic panel  . TSH

## 2014-04-06 ENCOUNTER — Telehealth: Payer: Self-pay | Admitting: *Deleted

## 2014-04-06 NOTE — Telephone Encounter (Signed)
Patient called regarding lab results. Patient notified of negative results.

## 2014-04-20 ENCOUNTER — Ambulatory Visit: Payer: Medicaid Other | Admitting: Neurology

## 2014-05-10 ENCOUNTER — Encounter: Payer: Self-pay | Admitting: *Deleted

## 2014-05-11 ENCOUNTER — Encounter: Payer: Self-pay | Admitting: Obstetrics & Gynecology

## 2014-06-01 ENCOUNTER — Encounter: Payer: Self-pay | Admitting: Obstetrics

## 2014-06-01 ENCOUNTER — Ambulatory Visit (INDEPENDENT_AMBULATORY_CARE_PROVIDER_SITE_OTHER): Payer: Medicaid Other | Admitting: Obstetrics

## 2014-06-01 VITALS — BP 123/69 | HR 87 | Temp 98.1°F | Wt 142.0 lb

## 2014-06-01 DIAGNOSIS — R102 Pelvic and perineal pain: Secondary | ICD-10-CM

## 2014-06-01 DIAGNOSIS — Z30431 Encounter for routine checking of intrauterine contraceptive device: Secondary | ICD-10-CM

## 2014-06-01 DIAGNOSIS — N898 Other specified noninflammatory disorders of vagina: Secondary | ICD-10-CM

## 2014-06-01 LAB — POCT URINALYSIS DIPSTICK
Bilirubin, UA: NEGATIVE
Blood, UA: NEGATIVE
Glucose, UA: NEGATIVE
KETONES UA: NEGATIVE
NITRITE UA: NEGATIVE
PH UA: 6
Protein, UA: NEGATIVE
Spec Grav, UA: 1.01
Urobilinogen, UA: NEGATIVE

## 2014-06-01 NOTE — Addendum Note (Signed)
Addended by: Odessa Fleming on: 06/01/2014 04:59 PM   Modules accepted: Orders

## 2014-06-01 NOTE — Progress Notes (Signed)
Patient ID: Danielle Holden, female   DOB: 06-04-1970, 44 y.o.   MRN: [id number]  Chief Complaint  Patient presents with  . Vaginitis    HPI Danielle Holden is a 44 y.o. female.  Presents with vaginal discharge with odor and itching, and also some lower mid-abdominal pain.  Partner has admitted infidelity recently.  Has Mirena IUD that was inserted in October 2015.  HPI  Past Medical History  Diagnosis Date  . Anxiety   . Arthritis   . Anemia   . Lupus   . Hypoglycemia   . Migraine     Past Surgical History  Procedure Laterality Date  . Cesarean section    . Appendectomy    . Tubal ligation      Family History  Problem Relation Age of Onset  . Cancer Maternal Grandmother   . Cancer Paternal Grandmother   . Suicidality Father     Social History History  Substance Use Topics  . Smoking status: Never Smoker   . Smokeless tobacco: Never Used  . Alcohol Use: No    Allergies  Allergen Reactions  . Trazodone And Nefazodone Anaphylaxis    Tongue swells.  . Anesthetics, Amide Other (See Comments)    Heart stopped and stopped breathing    Current Outpatient Prescriptions  Medication Sig Dispense Refill  . ALBUTEROL IN Inhale into the lungs.    . beclomethasone (QVAR) 40 MCG/ACT inhaler Inhale into the lungs 2 (two) times daily.    . Cyanocobalamin (VITAMIN B-12 PO) Place 1 drop under the tongue daily.     . fluticasone (FLONASE) 50 MCG/ACT nasal spray Place 1 spray into both nostrils daily as needed for allergies or rhinitis.    Marland Kitchen ibuprofen (ADVIL,MOTRIN) 200 MG tablet Take 600 mg by mouth every 6 (six) hours as needed for mild pain or moderate pain (pain).     Marland Kitchen loratadine (CLARITIN) 10 MG tablet Take 10 mg by mouth daily as needed for allergies (allergies).    . naproxen sodium (ANAPROX) 220 MG tablet Take 220 mg by mouth 2 (two) times daily as needed (pain).    . rizatriptan (MAXALT-MLT) 10 MG disintegrating tablet Take 1 tablet (10 mg total) by mouth as  needed for migraine. May repeat in 2 hours if needed 12 tablet 11  . venlafaxine XR (EFFEXOR XR) 37.5 MG 24 hr capsule Take 1 capsule (37.5 mg total) by mouth daily with breakfast. After 1 month increase to 2 daily. 60 capsule 2  . levonorgestrel (MIRENA) 20 MCG/24HR IUD 1 Intra Uterine Device (1 each total) by Intrauterine route once. 1 each 0  . predniSONE (DELTASONE) 10 MG tablet Take 2 tablets (20 mg total) by mouth daily. 6 tablet 0  . Prenat-FeFmCb-DSS-FA-DHA w/o A (CITRANATAL HARMONY) 27-1-260 MG CAPS Take 1 capsule by mouth daily before breakfast. 30 capsule 11  . pseudoephedrine (SUDAFED) 120 MG 12 hr tablet Take 120 mg by mouth daily as needed for congestion (congestion).      No current facility-administered medications for this visit.    Review of Systems Review of Systems Constitutional: negative for fatigue and weight loss Respiratory: negative for cough and wheezing Cardiovascular: negative for chest pain, fatigue and palpitations Gastrointestinal: negative for abdominal pain and change in bowel habits Genitourinary: vaginal discharge with odor and itching.  Pelvic pain. Integument/breast: negative for nipple discharge Musculoskeletal:negative for myalgias Neurological: negative for gait problems and tremors Behavioral/Psych: negative for abusive relationship, depression Endocrine: negative for temperature intolerance  Blood pressure 123/69, pulse 87, temperature 98.1 F (36.7 C), weight 142 lb (64.411 kg).  Physical Exam Physical Exam General:   alert  Skin:   no rash or abnormalities  Lungs:   clear to auscultation bilaterally  Heart:   regular rate and rhythm, S1, S2 normal, no murmur, click, rub or gallop  Breasts:   normal without suspicious masses, skin or nipple changes or axillary nodes  Abdomen:  normal findings: no organomegaly, soft, non-tender and no hernia  Pelvis:  External genitalia: normal general appearance Urinary system: urethral meatus normal  and bladder without fullness, nontender Vaginal: normal without tenderness, induration or masses Cervix: normal appearance Adnexa: normal bimanual exam Uterus: midposition and tender, normal size      Data Reviewed Labs  Assessment:     Vaginal discharge with odor and itching. Pelvic pain H/O Colitis so will avoid antibiotics until diagnosis of infection made    Plan  Wet prep and cultures sent R/O PID.   No orders of the defined types were placed in this encounter.   Meds ordered this encounter  Medications  . ALBUTEROL IN    Sig: Inhale into the lungs.  . beclomethasone (QVAR) 40 MCG/ACT inhaler    Sig: Inhale into the lungs 2 (two) times daily.

## 2014-06-01 NOTE — Addendum Note (Signed)
Addended by: Odessa Fleming on: 06/01/2014 04:15 PM   Modules accepted: Orders

## 2014-06-04 ENCOUNTER — Other Ambulatory Visit: Payer: Self-pay | Admitting: Obstetrics

## 2014-06-04 DIAGNOSIS — B9689 Other specified bacterial agents as the cause of diseases classified elsewhere: Secondary | ICD-10-CM

## 2014-06-04 DIAGNOSIS — N760 Acute vaginitis: Principal | ICD-10-CM

## 2014-06-04 LAB — SURESWAB, VAGINOSIS/VAGINITIS PLUS
Atopobium vaginae: 6.8 Log (cells/mL)
C. ALBICANS, DNA: NOT DETECTED
C. glabrata, DNA: NOT DETECTED
C. parapsilosis, DNA: NOT DETECTED
C. trachomatis RNA, TMA: NOT DETECTED
C. tropicalis, DNA: NOT DETECTED
GARDNERELLA VAGINALIS: 7.1 Log (cells/mL)
LACTOBACILLUS SPECIES: NOT DETECTED Log (cells/mL)
MEGASPHAERA SPECIES: 7.7 Log (cells/mL)
N. gonorrhoeae RNA, TMA: NOT DETECTED
T. VAGINALIS RNA, QL TMA: NOT DETECTED

## 2014-06-04 MED ORDER — METRONIDAZOLE 500 MG PO TABS: 500.0000 mg | ORAL_TABLET | Freq: Two times a day (BID) | ORAL | Status: DC

## 2014-06-07 ENCOUNTER — Other Ambulatory Visit: Payer: Self-pay | Admitting: *Deleted

## 2014-06-07 DIAGNOSIS — N760 Acute vaginitis: Principal | ICD-10-CM

## 2014-06-07 DIAGNOSIS — B9689 Other specified bacterial agents as the cause of diseases classified elsewhere: Secondary | ICD-10-CM

## 2014-06-07 MED ORDER — CLINDAMYCIN PHOSPHATE 2 % VA CREA: 1.0000 | TOPICAL_CREAM | Freq: Every day | VAGINAL | Status: DC

## 2014-06-17 ENCOUNTER — Ambulatory Visit: Payer: Medicaid Other | Admitting: Neurology

## 2014-07-26 ENCOUNTER — Ambulatory Visit (INDEPENDENT_AMBULATORY_CARE_PROVIDER_SITE_OTHER): Payer: Medicaid Other | Admitting: Neurology

## 2014-07-26 ENCOUNTER — Encounter: Payer: Self-pay | Admitting: Neurology

## 2014-07-26 VITALS — BP 121/72 | HR 61 | Ht 63.0 in | Wt 143.0 lb

## 2014-07-26 DIAGNOSIS — G43009 Migraine without aura, not intractable, without status migrainosus: Secondary | ICD-10-CM | POA: Diagnosis not present

## 2014-07-26 DIAGNOSIS — F411 Generalized anxiety disorder: Secondary | ICD-10-CM

## 2014-07-26 HISTORY — DX: Migraine without aura, not intractable, without status migrainosus: G43.009

## 2014-07-26 MED ORDER — RIZATRIPTAN BENZOATE 5 MG PO TBDP: 5.0000 mg | ORAL_TABLET | ORAL | Status: DC | PRN

## 2014-07-26 MED ORDER — TOPIRAMATE 25 MG PO TABS: ORAL_TABLET | ORAL | Status: DC

## 2014-07-26 NOTE — Progress Notes (Signed)
PATIENT: Danielle Holden DOB: 1971-02-19  REASON FOR VISIT: routine follow up for Migraine, extremity weakness HISTORY FROM: patient  HISTORY OF PRESENT ILLNESS: Danielle Holden is a 44 years old female, was by her primary care physician Dr.Boyd for evaluation of weakness at the right upper extremity, Last clinical visit with Lyn in Sept 2015.   She recently moved from [facility], is on disability due to normal disorder, also had a history of mild anemia, due to heavy menstrual bleeding, has improved with contraceptive, and iron pills,  She had past medical history of migraine headaches, still getting 3 migraines in one week, her typical migraines right side retro-orbital area severe pounding headache was associated light noise sensitivity, lasting 3-4 hours, improved by sleeping dark quiet room. She also complains of right arm and leg weakness, intermittent since April 2014, seems to getting worse, sometimes with severe headaches she also has blurry vision, slurred speech, mild gait difficulty due to right knee pain.  She carried a diagnosis of lupus per patient based on previous serology.  Recent MRI of the brain February 2015 with without contrast revealed that was normal, MRA of the brain was normal next left laboratory showed normal CBC, CMP.   Nortriptyline but had an allergic reaction, hives, and discontinued. S  UPDATE March 14th 2016: She has allergy to previous medications, presented with diarrhea, bilateral lower extremity edema, she still has headache 3 times a week, starting from right occipital, spreading forward to bifrontal, light noise bothersome, nauseous, lasting for whole day. Sleeps always helps.  She was started on effexor, started to break out in hives, down to her hands and feet, colitis frequent diarrhea,  She is taking ibuprofen prn for headache, sometimes, she woke up still has headaches, ringing her ear.   REVIEW OF SYSTEMS: Full 14 system review  of systems performed and notable only for: Fatigue, running nose, eye itching, palpitation, restless leg, insomnia, food allergy, joint pain, back pain, neck stiffness, anemia, dizziness, headaches, numbness, anxiety  ALLERGIES: Allergies  Allergen Reactions  . Trazodone Anaphylaxis    Tongue swells.  . Trazodone And Nefazodone Anaphylaxis    Tongue swells.  . Anesthetics, Amide Other (See Comments)    Heart stopped and stopped breathing    HOME MEDICATIONS: Outpatient Prescriptions Prior to Visit  Medication Sig Dispense Refill  . ALBUTEROL IN Inhale into the lungs.    . Cyanocobalamin (VITAMIN B-12 PO) Place 1 drop under the tongue daily.     . fluticasone (FLONASE) 50 MCG/ACT nasal spray Place 1 spray into both nostrils daily as needed for allergies or rhinitis.    Marland Kitchen ibuprofen (ADVIL,MOTRIN) 200 MG tablet Take 600 mg by mouth every 6 (six) hours as needed for mild pain or moderate pain (pain).     Marland Kitchen levonorgestrel (MIRENA) 20 MCG/24HR IUD 1 Intra Uterine Device (1 each total) by Intrauterine route once. 1 each 0  . loratadine (CLARITIN) 10 MG tablet Take 10 mg by mouth daily as needed for allergies (allergies).    . pseudoephedrine (SUDAFED) 120 MG 12 hr tablet Take 120 mg by mouth daily as needed for congestion (congestion).     . rizatriptan (MAXALT-MLT) 10 MG disintegrating tablet Take 1 tablet (10 mg total) by mouth as needed for migraine. May repeat in 2 hours if needed 12 tablet 11  . venlafaxine XR (EFFEXOR XR) 37.5 MG 24 hr capsule Take 1 capsule (37.5 mg total) by mouth daily with breakfast. After 1 month increase  to 2 daily. 60 capsule 2  . naproxen sodium (ANAPROX) 220 MG tablet Take 220 mg by mouth 2 (two) times daily as needed (pain).    . predniSONE (DELTASONE) 10 MG tablet Take 2 tablets (20 mg total) by mouth daily. 6 tablet 0  . Prenat-FeFmCb-DSS-FA-DHA w/o A (CITRANATAL HARMONY) 27-1-260 MG CAPS Take 1 capsule by mouth daily before breakfast. 30 capsule 11  .  beclomethasone (QVAR) 40 MCG/ACT inhaler Inhale into the lungs 2 (two) times daily.    . clindamycin (CLEOCIN) 2 % vaginal cream Place 1 Applicatorful vaginally at bedtime. 40 g 0   No facility-administered medications prior to visit.    PHYSICAL EXAM Filed Vitals:   07/26/14 1112  BP: 121/72  Pulse: 61  Height: 5\' 3"  (1.6 m)  Weight: 143 lb (64.864 kg)   Body mass index is 25.34 kg/(m^2). No exam data present No flowsheet data found.  No flowsheet data found. PHYSICAL EXAMNIATION:  Gen: NAD, conversant, well nourised, obese, well groomed                     Cardiovascular: Regular rate rhythm, no peripheral edema, warm, nontender. Eyes: Conjunctivae clear without exudates or hemorrhage Neck: Supple, no carotid bruise. Pulmonary: Clear to auscultation bilaterally   NEUROLOGICAL EXAM:  MENTAL STATUS: Speech:    Speech is normal; fluent and spontaneous with normal comprehension.  Cognition:    The patient is oriented to person, place, and time;     recent and remote memory intact;     language fluent;     normal attention, concentration,     fund of knowledge.  CRANIAL NERVES: CN II: Visual fields are full to confrontation. Fundoscopic exam is normal with sharp discs and no vascular changes. Venous pulsations are present bilaterally. Pupils are 4 mm and briskly reactive to light. Visual acuity is 20/20 bilaterally. CN III, IV, VI: extraocular movement are normal. No ptosis. CN V: Facial sensation is intact to pinprick in all 3 divisions bilaterally. Corneal responses are intact.  CN VII: Face is symmetric with normal eye closure and smile. CN VIII: Hearing is normal to rubbing fingers CN IX, X: Palate elevates symmetrically. Phonation is normal. CN XI: Head turning and shoulder shrug are intact CN XII: Tongue is midline with normal movements and no atrophy.  MOTOR: There is no pronator drift of out-stretched arms. Muscle bulk and tone are normal. Muscle strength is  normal.   Shoulder abduction Shoulder external rotation Elbow flexion Elbow extension Wrist flexion Wrist extension Finger abduction Hip flexion Knee flexion Knee extension Ankle dorsi flexion Ankle plantar flexion  R 5 5 5 5 5 5 5 5 5 5 5 5   L 5 5 5 5 5 5 5 5 5 5 5 5     REFLEXES: Reflexes are 2+ and symmetric at the biceps, triceps, knees, and ankles. Plantar responses are flexor.  SENSORY: Light touch, pinprick, position sense, and vibration sense are intact in fingers and toes.  COORDINATION: Rapid alternating movements and fine finger movements are intact. There is no dysmetria on finger-to-nose and heel-knee-shin. There are no abnormal or extraneous movements.   GAIT/STANCE: Posture is normal. Gait is steady with normal steps, base, arm swing, and turning. Heel and toe walking are normal. Tandem gait is normal.  Romberg is absent.   DIAGNOSTIC DATA (LABS, IMAGING, TESTING) - I reviewed patient records, labs, notes, testing and imaging myself where available.  Lab Results  Component Value Date   WBC 8.4  03/26/2014   HGB 12.5 03/26/2014   HCT 38.4 03/26/2014   MCV 81.7 03/26/2014   PLT 374 03/26/2014      Component Value Date/Time   NA 138 03/26/2014 1048   K 3.7 03/26/2014 1048   CL 104 03/26/2014 1048   CO2 24 03/26/2014 1048   GLUCOSE 64* 03/26/2014 1048   BUN 8 03/26/2014 1048   CREATININE 0.70 03/26/2014 1048   CREATININE 0.80 08/19/2013 1543   CALCIUM 8.6 03/26/2014 1048   PROT 7.0 03/26/2014 1048   ALBUMIN 4.2 03/26/2014 1048   AST 18 03/26/2014 1048   ALT 12 03/26/2014 1048   ALKPHOS 71 03/26/2014 1048   BILITOT 0.5 03/26/2014 1048   GFRNONAA >90 06/27/2013 1816   GFRAA >90 06/27/2013 1816   Lab Results  Component Value Date   CHOL 125 06/28/2013   HDL 61 06/28/2013   LDLCALC 57 06/28/2013   TRIG 34 06/28/2013   CHOLHDL 2.0 06/28/2013   Lab Results  Component Value Date   HGBA1C 5.3 06/28/2013   Lab Results  Component Value Date    VITAMINB12 380 06/28/2013   Lab Results  Component Value Date   TSH 1.558 03/26/2014   Lab Results  Component Value Date   ESRSEDRATE 20 06/29/2013    ASSESSMENT/PLAN: Danielle Holden is a 44 y.o. female complains of frequent headaches, with migraine features, essentially normal neurological examination, normal MRI of the brain with and without contrast, normal MRA of the brain   1, we will start preventive medications Topamax, titrating to 25 mg 2 tablets twice a day 2, Maxalt as needed 3. Return to clinic with Eber Jones in 3 months.  Levert Feinstein, M.D. Ph.D.  [facility] [address] [place], [place] [address] Phone: [phone] Fax:      [phone]

## 2014-09-08 ENCOUNTER — Encounter: Payer: Self-pay | Admitting: Obstetrics

## 2014-09-08 ENCOUNTER — Ambulatory Visit (INDEPENDENT_AMBULATORY_CARE_PROVIDER_SITE_OTHER): Payer: Medicaid Other | Admitting: Obstetrics

## 2014-09-08 ENCOUNTER — Other Ambulatory Visit: Payer: Self-pay | Admitting: Obstetrics

## 2014-09-08 VITALS — BP 106/70 | HR 60 | Temp 98.3°F | Ht 63.0 in | Wt 145.0 lb

## 2014-09-08 DIAGNOSIS — Z30432 Encounter for removal of intrauterine contraceptive device: Secondary | ICD-10-CM | POA: Diagnosis not present

## 2014-09-08 DIAGNOSIS — N63 Unspecified lump in unspecified breast: Secondary | ICD-10-CM

## 2014-09-08 DIAGNOSIS — M545 Low back pain: Secondary | ICD-10-CM

## 2014-09-08 DIAGNOSIS — N949 Unspecified condition associated with female genital organs and menstrual cycle: Secondary | ICD-10-CM | POA: Diagnosis not present

## 2014-09-08 DIAGNOSIS — B3731 Acute candidiasis of vulva and vagina: Secondary | ICD-10-CM

## 2014-09-08 DIAGNOSIS — N939 Abnormal uterine and vaginal bleeding, unspecified: Secondary | ICD-10-CM | POA: Diagnosis not present

## 2014-09-08 DIAGNOSIS — R102 Pelvic and perineal pain unspecified side: Secondary | ICD-10-CM

## 2014-09-08 DIAGNOSIS — B373 Candidiasis of vulva and vagina: Secondary | ICD-10-CM | POA: Diagnosis not present

## 2014-09-08 LAB — POCT URINALYSIS DIPSTICK
Blood, UA: NEGATIVE
GLUCOSE UA: NEGATIVE
Ketones, UA: NEGATIVE
Leukocytes, UA: NEGATIVE
Nitrite, UA: NEGATIVE
Spec Grav, UA: 1.01
pH, UA: 5

## 2014-09-08 MED ORDER — NORETHINDRONE 0.35 MG PO TABS: 1.0000 | ORAL_TABLET | Freq: Every day | ORAL | Status: DC

## 2014-09-08 MED ORDER — TERCONAZOLE 0.4 % VA CREA: 1.0000 | TOPICAL_CREAM | Freq: Every day | VAGINAL | Status: DC

## 2014-09-08 NOTE — Progress Notes (Signed)
Patient is in the office for annual exam- pap smear. Patient think she has a yeast infection. Patient state she would like to have the IUD removed- she is having cramping , bloating and headaches. Patient has bleeding after IC every time.

## 2014-09-08 NOTE — Progress Notes (Signed)
Patient ID: Danielle Holden, female   DOB: 09-28-1970, 44 y.o.   MRN: [id number]  Chief Complaint  Patient presents with  . Gynecologic Exam    annual exam- IUD removal    HPI Danielle Holden is a 44 y.o. female.  Requests removal of IUD because of constant cramping and irregular bleeding. C/O lump in right breast, tender.  Also has a smaller lump in left breast.  HPI  Past Medical History  Diagnosis Date  . Anxiety   . Arthritis   . Anemia   . Lupus   . Hypoglycemia   . Migraine     Past Surgical History  Procedure Laterality Date  . Cesarean section    . Appendectomy    . Tubal ligation      Family History  Problem Relation Age of Onset  . Cancer Maternal Grandmother   . Cancer Paternal Grandmother   . Suicidality Father     Social History History  Substance Use Topics  . Smoking status: Never Smoker   . Smokeless tobacco: Never Used  . Alcohol Use: No    Allergies  Allergen Reactions  . Trazodone Anaphylaxis    Tongue swells.  . Trazodone And Nefazodone Anaphylaxis    Tongue swells.  . Anesthetics, Amide Other (See Comments)    Heart stopped and stopped breathing    Current Outpatient Prescriptions  Medication Sig Dispense Refill  . ALBUTEROL IN Inhale into the lungs.    . Cyanocobalamin (VITAMIN B-12 PO) Place 1 drop under the tongue daily.     . fluticasone (FLONASE) 50 MCG/ACT nasal spray Place 1 spray into both nostrils daily as needed for allergies or rhinitis.    Marland Kitchen ibuprofen (ADVIL,MOTRIN) 200 MG tablet Take 600 mg by mouth every 6 (six) hours as needed for mild pain or moderate pain (pain).     Marland Kitchen levonorgestrel (MIRENA) 20 MCG/24HR IUD 1 Intra Uterine Device (1 each total) by Intrauterine route once. 1 each 0  . loratadine (CLARITIN) 10 MG tablet Take 10 mg by mouth daily as needed for allergies (allergies).    . rizatriptan (MAXALT-MLT) 5 MG disintegrating tablet Take 1 tablet (5 mg total) by mouth as needed. May repeat in 2 hours if needed 15  tablet 6  . topiramate (TOPAMAX) 25 MG tablet One po bid xone week, then 2 tabs po bid 120 tablet 6  . norethindrone (ORTHO MICRONOR) 0.35 MG tablet Take 1 tablet (0.35 mg total) by mouth daily. 1 Package 11  . terconazole (TERAZOL 7) 0.4 % vaginal cream Place 1 applicator vaginally at bedtime. 45 g 2   No current facility-administered medications for this visit.    Review of Systems Review of Systems Constitutional: negative for fatigue and weight loss Respiratory: negative for cough and wheezing Cardiovascular: negative for chest pain, fatigue and palpitations Gastrointestinal: negative for abdominal pain and change in bowel habits Genitourinary:negative Integument/breast: negative for nipple discharge Musculoskeletal:negative for myalgias Neurological: negative for gait problems and tremors Behavioral/Psych: negative for abusive relationship, depression Endocrine: negative for temperature intolerance     Blood pressure 106/70, pulse 60, temperature 98.3 F (36.8 C), height 5\' 3"  (1.6 m), weight 145 lb (65.772 kg), last menstrual period 07/16/2014.  Physical Exam Physical Exam             Breasts:   mass in right breast at 2 o'clock   Abdomen:  normal findings: no organomegaly, soft, non-tender and no hernia  Pelvis:  External genitalia: normal general appearance Urinary  system: urethral meatus normal and bladder without fullness, nontender Vaginal: normal without tenderness, induration or masses Cervix: normal appearance.  IUD string grasped with ring forceps and removed, intact Adnexa: normal bimanual exam Uterus: anteverted and non-tender, normal size      Data Reviewed Mammogram  Assessment     IUD Removal Breast Lumps  H/O AUB    Plan    [facility] Rx ( partner has had a vasectomy ) Referred to [facility] F/[facility] in 2 weeks for results and Annual Exam    Orders Placed This Encounter  Procedures  . SureSwab, Vaginosis/Vaginitis Plus  . MM DIAG BREAST  TOMO BILATERAL    COSIGN REQ 09-08-14 Breast lumps, bilateral. (lumps are scattered throughout each breast) PF;08/28/2013 BCG     NO NEEDS/NO IMPLANTS  FD/BARB [phone]     MEDICAID     Standing Status: Future     Number of Occurrences:      Standing Expiration Date: 11/08/2015    Order Specific Question:  Reason for Exam (SYMPTOM  OR DIAGNOSIS REQUIRED)    Answer:  Breast lumps, bilateral. (lumps are scattered throughout each breast)    Order Specific Question:  Is the patient pregnant?    Answer:  No    Order Specific Question:  Preferred imaging location?    Answer:  [facility]  . POCT urinalysis dipstick   Meds ordered this encounter  Medications  . norethindrone (ORTHO MICRONOR) 0.35 MG tablet    Sig: Take 1 tablet (0.35 mg total) by mouth daily.    Dispense:  1 Package    Refill:  11  . terconazole (TERAZOL 7) 0.4 % vaginal cream    Sig: Place 1 applicator vaginally at bedtime.    Dispense:  45 g    Refill:  2

## 2014-09-11 ENCOUNTER — Other Ambulatory Visit: Payer: Self-pay | Admitting: Obstetrics

## 2014-09-11 DIAGNOSIS — B9689 Other specified bacterial agents as the cause of diseases classified elsewhere: Secondary | ICD-10-CM

## 2014-09-11 DIAGNOSIS — N760 Acute vaginitis: Principal | ICD-10-CM

## 2014-09-11 LAB — SURESWAB, VAGINOSIS/VAGINITIS PLUS
Atopobium vaginae: 6.3 Log (cells/mL)
C. albicans, DNA: NOT DETECTED
C. glabrata, DNA: NOT DETECTED
C. parapsilosis, DNA: NOT DETECTED
C. trachomatis RNA, TMA: NOT DETECTED
C. tropicalis, DNA: NOT DETECTED
GARDNERELLA VAGINALIS: 7.4 Log (cells/mL)
LACTOBACILLUS SPECIES: NOT DETECTED Log (cells/mL)
MEGASPHAERA SPECIES: 6.9 Log (cells/mL)
N. GONORRHOEAE RNA, TMA: NOT DETECTED
T. vaginalis RNA, QL TMA: NOT DETECTED

## 2014-09-11 MED ORDER — TINIDAZOLE 500 MG PO TABS: 1000.0000 mg | ORAL_TABLET | Freq: Every day | ORAL | Status: DC

## 2014-09-13 ENCOUNTER — Other Ambulatory Visit: Payer: Medicaid Other

## 2014-09-13 ENCOUNTER — Telehealth: Payer: Self-pay | Admitting: *Deleted

## 2014-09-13 NOTE — Telephone Encounter (Signed)
Patient is calling regarding her medications- Patient was given an oral antibiotic and she can't use that due to her Colitis. She needs a vaginal treatment for BV please.

## 2014-09-14 ENCOUNTER — Other Ambulatory Visit: Payer: Self-pay | Admitting: *Deleted

## 2014-09-14 DIAGNOSIS — N760 Acute vaginitis: Principal | ICD-10-CM

## 2014-09-14 DIAGNOSIS — B9689 Other specified bacterial agents as the cause of diseases classified elsewhere: Secondary | ICD-10-CM

## 2014-09-14 MED ORDER — METRONIDAZOLE 0.75 % VA GEL: 1.0000 | Freq: Every day | VAGINAL | Status: DC

## 2014-09-15 ENCOUNTER — Other Ambulatory Visit: Payer: Self-pay | Admitting: Obstetrics

## 2014-09-15 DIAGNOSIS — B9689 Other specified bacterial agents as the cause of diseases classified elsewhere: Secondary | ICD-10-CM

## 2014-09-15 DIAGNOSIS — N760 Acute vaginitis: Principal | ICD-10-CM

## 2014-09-15 MED ORDER — METRONIDAZOLE 0.75 % VA GEL: 1.0000 | Freq: Two times a day (BID) | VAGINAL | Status: DC

## 2014-09-15 NOTE — Telephone Encounter (Signed)
Patient notified Rx at pharmacy 

## 2014-09-15 NOTE — Telephone Encounter (Signed)
[facility] 

## 2014-09-17 ENCOUNTER — Inpatient Hospital Stay (HOSPITAL_COMMUNITY)
Admission: AD | Admit: 2014-09-17 | Discharge: 2014-09-17 | Disposition: A | Discharge status: Home / Self Care | Payer: Medicaid Other | Source: Ambulatory Visit | Attending: Obstetrics | Admitting: Obstetrics

## 2014-09-17 ENCOUNTER — Other Ambulatory Visit: Payer: Self-pay | Admitting: Obstetrics

## 2014-09-17 ENCOUNTER — Encounter (HOSPITAL_COMMUNITY): Payer: Self-pay

## 2014-09-17 DIAGNOSIS — N390 Urinary tract infection, site not specified: Secondary | ICD-10-CM | POA: Insufficient documentation

## 2014-09-17 DIAGNOSIS — N939 Abnormal uterine and vaginal bleeding, unspecified: Secondary | ICD-10-CM | POA: Diagnosis not present

## 2014-09-17 DIAGNOSIS — N3001 Acute cystitis with hematuria: Secondary | ICD-10-CM

## 2014-09-17 DIAGNOSIS — N920 Excessive and frequent menstruation with regular cycle: Secondary | ICD-10-CM

## 2014-09-17 LAB — URINALYSIS, ROUTINE W REFLEX MICROSCOPIC
Bilirubin Urine: NEGATIVE
Glucose, UA: NEGATIVE mg/dL
Ketones, ur: 15 mg/dL — AB
Nitrite: POSITIVE — AB
UROBILINOGEN UA: 2 mg/dL — AB (ref 0.0–1.0)
pH: 5 (ref 5.0–8.0)

## 2014-09-17 LAB — CBC
HCT: 33.2 % — ABNORMAL LOW (ref 36.0–46.0)
HEMOGLOBIN: 10.5 g/dL — AB (ref 12.0–15.0)
MCH: 24.4 pg — ABNORMAL LOW (ref 26.0–34.0)
MCHC: 31.6 g/dL (ref 30.0–36.0)
MCV: 77.2 fL — ABNORMAL LOW (ref 78.0–100.0)
Platelets: 277 10*3/uL (ref 150–400)
RBC: 4.3 MIL/uL (ref 3.87–5.11)
RDW: 16.7 % — AB (ref 11.5–15.5)
WBC: 7.9 10*3/uL (ref 4.0–10.5)

## 2014-09-17 LAB — URINE MICROSCOPIC-ADD ON

## 2014-09-17 LAB — POCT PREGNANCY, URINE: PREG TEST UR: NEGATIVE

## 2014-09-17 MED ORDER — MEDROXYPROGESTERONE ACETATE 150 MG/ML IM SUSP
150.0000 mg | Freq: Once | INTRAMUSCULAR | Status: AC
  Administered 2014-09-17: 150 mg via INTRAMUSCULAR

## 2014-09-17 MED ORDER — NITROFURANTOIN MONOHYD MACRO 100 MG PO CAPS: 100.0000 mg | ORAL_CAPSULE | Freq: Two times a day (BID) | ORAL | Status: AC

## 2014-09-17 NOTE — Progress Notes (Signed)
Has been bleeding for 8 days passing huge clots.

## 2014-09-17 NOTE — Discharge Instructions (Signed)
° °Urinary Tract Infection °Urinary tract infections (UTIs) can develop anywhere along your urinary tract. Your urinary tract is your body's drainage system for removing wastes and extra water. Your urinary tract includes two kidneys, two ureters, a bladder, and a urethra. Your kidneys are a pair of bean-shaped organs. Each kidney is about the size of your fist. They are located below your ribs, one on each side of your spine. °CAUSES °Infections are caused by microbes, which are microscopic organisms, including fungi, viruses, and bacteria. These organisms are so small that they can only be seen through a microscope. Bacteria are the microbes that most commonly cause UTIs. °SYMPTOMS  °Symptoms of UTIs may vary by age and gender of the patient and by the location of the infection. Symptoms in young women typically include a frequent and intense urge to urinate and a painful, burning feeling in the bladder or urethra during urination. Older women and men are more likely to be tired, shaky, and weak and have muscle aches and abdominal pain. A fever may mean the infection is in your kidneys. Other symptoms of a kidney infection include pain in your back or sides below the ribs, nausea, and vomiting. °DIAGNOSIS °To diagnose a UTI, your caregiver will ask you about your symptoms. Your caregiver also will ask to provide a urine sample. The urine sample will be tested for bacteria and white blood cells. White blood cells are made by your body to help fight infection. °TREATMENT  °Typically, UTIs can be treated with medication. Because most UTIs are caused by a bacterial infection, they usually can be treated with the use of antibiotics. The choice of antibiotic and length of treatment depend on your symptoms and the type of bacteria causing your infection. °HOME CARE INSTRUCTIONS °· If you were prescribed antibiotics, take them exactly as your caregiver instructs you. Finish the medication even if you feel better after  you have only taken some of the medication. °· Drink enough water and fluids to keep your urine clear or pale yellow. °· Avoid caffeine, tea, and carbonated beverages. They tend to irritate your bladder. °· Empty your bladder often. Avoid holding urine for long periods of time. °· Empty your bladder before and after sexual intercourse. °· After a bowel movement, women should cleanse from front to back. Use each tissue only once. °SEEK MEDICAL CARE IF:  °· You have back pain. °· You develop a fever. °· Your symptoms do not begin to resolve within 3 days. °SEEK IMMEDIATE MEDICAL CARE IF:  °· You have severe back pain or lower abdominal pain. °· You develop chills. °· You have nausea or vomiting. °· You have continued burning or discomfort with urination. °MAKE SURE YOU:  °· Understand these instructions. °· Will watch your condition. °· Will get help right away if you are not doing well or get worse. °Document Released: 02/07/2005 Document Revised: 10/30/2011 Document Reviewed: 06/08/2011 °Exit[employer]® Patient Information ©2015 [employer]. This information is not intended to replace advice given to you by your health care provider. Make sure you discuss any questions you have with your health care provider. ° °Menorrhagia °Menorrhagia is the medical term for when your menstrual periods are heavy or last longer than usual. With menorrhagia, every period you have may cause enough blood loss and cramping that you are unable to maintain your usual activities. °CAUSES  °In some cases, the cause of heavy periods is unknown, but a number of conditions may cause menorrhagia. Common causes include: °· A   problem with the hormone-producing thyroid gland (hypothyroid). °· Noncancerous growths in the uterus (polyps or fibroids). °· An imbalance of the estrogen and progesterone hormones. °· One of your ovaries not releasing an egg during one or more months. °· Side effects of having an intrauterine device (IUD). °· Side effects of  some medicines, such as anti-inflammatory medicines or blood thinners. °· A bleeding disorder that stops your blood from clotting normally. °SIGNS AND SYMPTOMS  °During a normal period, bleeding lasts between 4 and 8 days. Signs that your periods are too heavy include: °· You routinely have to change your pad or tampon every 1 or 2 hours because it is completely soaked. °· You pass blood clots larger than 1 inch (2.5 cm) in size. °· You have bleeding for more than 7 days. °· You need to use pads and tampons at the same time because of heavy bleeding. °· You need to wake up to change your pads or tampons during the night. °· You have symptoms of anemia, such as tiredness, fatigue, or shortness of breath.  °DIAGNOSIS  °Your health care provider will perform a physical exam and ask you questions about your symptoms and menstrual history. Other tests may be ordered based on what the health care provider finds during the exam. These tests can include: °· Blood tests. Blood tests are used to check if you are pregnant or have hormonal changes, a bleeding or thyroid disorder, low iron levels (anemia), or other problems. °· Endometrial biopsy. Your health care provider takes a sample of tissue from the inside of your uterus to be examined under a microscope. °· Pelvic ultrasound. This test uses sound waves to make a picture of your uterus, ovaries, and vagina. The pictures can show if you have fibroids or other growths. °· Hysteroscopy. For this test, your health care provider will use a small telescope to look inside your uterus. °Based on the results of your initial tests, your health care provider may recommend further testing. °TREATMENT  °Treatment may not be needed. If it is needed, your health care provider may recommend treatment with one or more medicines first. If these do not reduce bleeding enough, a surgical treatment might be an option. The best treatment for you will depend on:  °· Whether you need to prevent  pregnancy.   °· Your desire to have children in the future. °· The cause and severity of your bleeding. °· Your opinion and personal preference.   °Medicines for menorrhagia may include: °· Birth control methods that use hormones. These include the pill, skin patch, vaginal ring, shots that you get every 3 months, hormonal IUD, and implant. These treatments reduce bleeding during your menstrual period. °· Medicines that thicken blood and slow bleeding. °· Medicines that reduce swelling, such as ibuprofen.  °· Medicines that contain a synthetic hormone called progestin.   °· Medicines that make the ovaries stop working for a short time.   °You may need surgical treatment for menorrhagia if the medicines are unsuccessful. Treatment options include: °· Dilation and curettage (D&C). In this procedure, your health care provider opens (dilates) your cervix and then scrapes or suctions tissue from the lining of your uterus to reduce menstrual bleeding. °· Operative hysteroscopy. This procedure uses a tiny tube with a light (hysteroscope) to view your uterine cavity and can help in the surgical removal of a polyp that may be causing heavy periods. °· Endometrial ablation. Through various techniques, your health care provider permanently destroys the entire lining of your uterus (endometrium). After endometrial   ablation, most women have little or no menstrual flow. Endometrial ablation reduces your ability to become pregnant. °· Endometrial resection. This surgical procedure uses an electrosurgical wire loop to remove the lining of the uterus. This procedure also reduces your ability to become pregnant. °· Hysterectomy. Surgical removal of the uterus and cervix is a permanent procedure that stops menstrual periods. Pregnancy is not possible after a hysterectomy. This procedure requires anesthesia and hospitalization. °HOME CARE INSTRUCTIONS  °· Only take over-the-counter or prescription medicines as directed by your health  care provider. Take prescribed medicines exactly as directed. Do not change or switch medicines without consulting your health care provider. °· Take any prescribed iron pills exactly as directed by your health care provider. Long-term heavy bleeding may result in low iron levels. Iron pills help replace the iron your body lost from heavy bleeding. Iron may cause constipation. If this becomes a problem, increase the bran, fruits, and roughage in your diet. °· Do not take aspirin or medicines that contain aspirin 1 week before or during your menstrual period. Aspirin may make the bleeding worse. °· If you need to change your sanitary pad or tampon more than once every 2 hours, stay in bed and rest as much as possible until the bleeding stops. °· Eat well-balanced meals. Eat foods high in iron. Examples are leafy green vegetables, meat, liver, eggs, and whole grain breads and cereals. Do not try to lose weight until the abnormal bleeding has stopped and your blood iron level is back to normal. °SEEK MEDICAL CARE IF:  °· You soak through a pad or tampon every 1 or 2 hours, and this happens every time you have a period. °· You need to use pads and tampons at the same time because you are bleeding so much. °· You need to change your pad or tampon during the night. °· You have a period that lasts for more than 8 days. °· You pass clots bigger than 1 inch wide. °· You have irregular periods that happen more or less often than once a month. °· You feel dizzy or faint. °· You feel very weak or tired. °· You feel short of breath or feel your heart is beating too fast when you exercise. °· You have nausea and vomiting or diarrhea while you are taking your medicine. °· You have any problems that may be related to the medicine you are taking. °SEEK IMMEDIATE MEDICAL CARE IF:  °· You soak through 4 or more pads or tampons in 2 hours. °· You have any bleeding while you are pregnant. °MAKE SURE YOU:  °· Understand these  instructions. °· Will watch your condition. °· Will get help right away if you are not doing well or get worse. °Document Released: 04/30/2005 Document Revised: 05/05/2013 Document Reviewed: 10/19/2012 °[employer]Care® Patient Information ©2015 [employer]. This information is not intended to replace advice given to you by your health care provider. Make sure you discuss any questions you have with your health care provider. ° ° °

## 2014-09-17 NOTE — MAU Note (Signed)
IUD removed by Dr. Clearance Coots last Wednesday, the next day the pt had trouble walking, also started bleeding & passing clots.  Having lower abd pain.

## 2014-09-17 NOTE — MAU Provider Note (Signed)
History     CSN: [id number]  Arrival date and time: 09/17/14 1146   First Provider Initiated Contact with Patient 09/17/14 1516      Chief Complaint  Patient presents with  . Vaginal Bleeding   Vaginal Bleeding The patient's primary symptoms include vaginal bleeding. This is a new problem. The current episode started in the past 7 days. The problem occurs constantly. The problem has been gradually worsening. The patient is experiencing no pain. The vaginal bleeding is heavier than menses. She has been passing clots. She has not been passing tissue. Treatments tried: progestin only pills. It is unknown whether or not her partner has an STD. She uses oral contraceptives for contraception. Her past medical history is significant for a Cesarean section.   44 y.o. [id number] presents from Dr Verdell Carmine office today with the complaint of vaginal bleeding since last Wednesday when he took her iud out per her request. She has since been taking progestin only pills but continues to bleed heavily.  Past Medical History  Diagnosis Date  . Anxiety   . Arthritis   . Anemia   . Lupus   . Hypoglycemia   . Migraine     Past Surgical History  Procedure Laterality Date  . Cesarean section    . Appendectomy    . Tubal ligation      Family History  Problem Relation Age of Onset  . Cancer Maternal Grandmother   . Cancer Paternal Grandmother   . Suicidality Father     History  Substance Use Topics  . Smoking status: Never Smoker   . Smokeless tobacco: Never Used  . Alcohol Use: No    Allergies:  Allergies  Allergen Reactions  . Anesthetics, Amide Anaphylaxis and Other (See Comments)    Heart stopped and stopped breathing  . Trazodone Anaphylaxis    Tongue swells.  . Trazodone And Nefazodone Anaphylaxis    Tongue swells.    Prescriptions prior to admission  Medication Sig Dispense Refill Last Dose  . albuterol (PROVENTIL HFA;VENTOLIN HFA) 108 (90 BASE) MCG/ACT inhaler Inhale 1-2  puffs into the lungs every 6 (six) hours as needed for wheezing or shortness of breath.   rescue  . metroNIDAZOLE (METROGEL VAGINAL) 0.75 % vaginal gel Place 1 Applicatorful vaginally 2 (two) times daily. (Patient taking differently: Place 1 Applicatorful vaginally at bedtime. ) 70 g 2 09/16/2014 at Unknown time  . norethindrone (ORTHO MICRONOR) 0.35 MG tablet Take 1 tablet (0.35 mg total) by mouth daily. 1 Package 11 09/16/2014 at Unknown time  . levonorgestrel (MIRENA) 20 MCG/24HR IUD 1 Intra Uterine Device (1 each total) by Intrauterine route once. (Patient not taking: Reported on 09/17/2014) 1 each 0 Taking  . metroNIDAZOLE (METROGEL VAGINAL) 0.75 % vaginal gel Place 1 Applicatorful vaginally at bedtime. (Patient not taking: Reported on 09/17/2014) 70 g 0   . rizatriptan (MAXALT-MLT) 5 MG disintegrating tablet Take 1 tablet (5 mg total) by mouth as needed. May repeat in 2 hours if needed (Patient not taking: Reported on 09/17/2014) 15 tablet 6 Not Taking at Unknown time  . terconazole (TERAZOL 7) 0.4 % vaginal cream Place 1 applicator vaginally at bedtime. (Patient not taking: Reported on 09/17/2014) 45 g 2   . tinidazole (TINDAMAX) 500 MG tablet Take 2 tablets (1,000 mg total) by mouth daily with breakfast. (Patient not taking: Reported on 09/17/2014) 10 tablet 2   . topiramate (TOPAMAX) 25 MG tablet One po bid xone week, then 2 tabs po bid (Patient not taking:  Reported on 09/17/2014) 120 tablet 6 Taking    Review of Systems  Genitourinary: Positive for vaginal bleeding.       Vaginal bleeding with clots  All other systems reviewed and are negative.  Physical Exam   Blood pressure 115/66, pulse 75, temperature 97.9 F (36.6 C), temperature source Oral, resp. rate 20, last menstrual period 09/09/2014.  Physical Exam  Nursing note and vitals reviewed. Constitutional: She is oriented to person, place, and time. She appears well-developed and well-nourished. No distress.  HENT:  Head: Normocephalic and  atraumatic.  Neck: Normal range of motion.  Cardiovascular: Normal rate.   Respiratory: Effort normal. No respiratory distress.  GI: Soft.  Neurological: She is alert and oriented to person, place, and time.  Skin: Skin is warm and dry.  Psychiatric: She has a normal mood and affect. Her behavior is normal. Judgment and thought content normal.   Results for orders placed or performed during the hospital encounter of 09/17/14 (from the past 24 hour(s))  Urinalysis, Routine w reflex microscopic     Status: Abnormal   Collection Time: 09/17/14 12:00 PM  Result Value Ref Range   Color, Urine RED (A) YELLOW   APPearance CLOUDY (A) CLEAR   Specific Gravity, Urine >1.030 (H) 1.005 - 1.030   pH 5.0 5.0 - 8.0   Glucose, UA NEGATIVE NEGATIVE mg/dL   Hgb urine dipstick LARGE (A) NEGATIVE   Bilirubin Urine NEGATIVE NEGATIVE   Ketones, ur 15 (A) NEGATIVE mg/dL   Protein, ur >106 (A) NEGATIVE mg/dL   Urobilinogen, UA 2.0 (H) 0.0 - 1.0 mg/dL   Nitrite POSITIVE (A) NEGATIVE   Leukocytes, UA SMALL (A) NEGATIVE  Urine microscopic-add on     Status: None   Collection Time: 09/17/14 12:00 PM  Result Value Ref Range   Squamous Epithelial / LPF RARE RARE   RBC / HPF TOO NUMEROUS TO COUNT <3 RBC/hpf   Urine-Other MICROSCOPIC EXAM PERFORMED ON UNCONCENTRATED URINE   Pregnancy, urine POC     Status: None   Collection Time: 09/17/14 12:08 PM  Result Value Ref Range   Preg Test, Ur NEGATIVE NEGATIVE  CBC     Status: Abnormal   Collection Time: 09/17/14  1:45 PM  Result Value Ref Range   WBC 7.9 4.0 - 10.5 K/uL   RBC 4.30 3.87 - 5.11 MIL/uL   Hemoglobin 10.5 (L) 12.0 - 15.0 g/dL   HCT 26.9 (L) 48.5 - 46.2 %   MCV 77.2 (L) 78.0 - 100.0 fL   MCH 24.4 (L) 26.0 - 34.0 pg   MCHC 31.6 30.0 - 36.0 g/dL   RDW 70.3 (H) 50.0 - 93.8 %   Platelets 277 150 - 400 K/uL    MAU Course  Procedures  Assessment and Plan  Heavy menses Urinary Tract Infection  macrobid 100 mg Send urine cx Depo Provera  Im Follow up with Dr Clearance Coots Discharge to home  [facility] 09/17/2014, 3:17 PM

## 2014-09-18 LAB — URINE CULTURE: SPECIAL REQUESTS: NORMAL

## 2014-09-20 ENCOUNTER — Ambulatory Visit
Admission: RE | Admit: 2014-09-20 | Discharge: 2014-09-20 | Disposition: A | Discharge status: Home / Self Care | Payer: Medicaid Other | Source: Ambulatory Visit | Attending: Obstetrics | Admitting: Obstetrics

## 2014-09-20 ENCOUNTER — Encounter (INDEPENDENT_AMBULATORY_CARE_PROVIDER_SITE_OTHER): Payer: Self-pay

## 2014-09-20 DIAGNOSIS — N63 Unspecified lump in unspecified breast: Secondary | ICD-10-CM

## 2014-09-22 ENCOUNTER — Encounter: Payer: Self-pay | Admitting: Obstetrics

## 2014-09-22 ENCOUNTER — Ambulatory Visit (INDEPENDENT_AMBULATORY_CARE_PROVIDER_SITE_OTHER): Payer: Medicaid Other | Admitting: Obstetrics

## 2014-09-22 VITALS — BP 120/68 | HR 65 | Temp 97.5°F | Wt 143.0 lb

## 2014-09-22 DIAGNOSIS — Z01419 Encounter for gynecological examination (general) (routine) without abnormal findings: Secondary | ICD-10-CM

## 2014-09-22 DIAGNOSIS — Z0000 Encounter for general adult medical examination without abnormal findings: Secondary | ICD-10-CM | POA: Diagnosis not present

## 2014-09-22 DIAGNOSIS — N939 Abnormal uterine and vaginal bleeding, unspecified: Secondary | ICD-10-CM

## 2014-09-22 LAB — CBC
HEMATOCRIT: 36.2 % (ref 36.0–46.0)
Hemoglobin: 11.1 g/dL — ABNORMAL LOW (ref 12.0–15.0)
MCH: 23.8 pg — AB (ref 26.0–34.0)
MCHC: 30.7 g/dL (ref 30.0–36.0)
MCV: 77.5 fL — ABNORMAL LOW (ref 78.0–100.0)
MPV: 9.7 fL (ref 8.6–12.4)
Platelets: 377 10*3/uL (ref 150–400)
RBC: 4.67 MIL/uL (ref 3.87–5.11)
RDW: 16.1 % — AB (ref 11.5–15.5)
WBC: 7.1 10*3/uL (ref 4.0–10.5)

## 2014-09-22 LAB — COMPREHENSIVE METABOLIC PANEL
ALK PHOS: 62 U/L (ref 39–117)
ALT: 11 U/L (ref 0–35)
AST: 15 U/L (ref 0–37)
Albumin: 4.1 g/dL (ref 3.5–5.2)
BILIRUBIN TOTAL: 0.4 mg/dL (ref 0.2–1.2)
BUN: 12 mg/dL (ref 6–23)
CO2: 23 mEq/L (ref 19–32)
Calcium: 8.9 mg/dL (ref 8.4–10.5)
Chloride: 107 mEq/L (ref 96–112)
Creat: 0.66 mg/dL (ref 0.50–1.10)
Glucose, Bld: 55 mg/dL — ABNORMAL LOW (ref 70–99)
POTASSIUM: 4.3 meq/L (ref 3.5–5.3)
Sodium: 138 mEq/L (ref 135–145)
Total Protein: 7.1 g/dL (ref 6.0–8.3)

## 2014-09-22 LAB — LIPID PANEL
CHOLESTEROL: 144 mg/dL (ref 0–200)
HDL: 61 mg/dL (ref >=46)
LDL CALC: 74 mg/dL (ref 0–99)
Total CHOL/HDL Ratio: 2.4 Ratio
Triglycerides: 46 mg/dL (ref <150)
VLDL: 9 mg/dL (ref 0–40)

## 2014-09-22 LAB — TSH: TSH: 1.639 u[IU]/mL (ref 0.350–4.500)

## 2014-09-22 NOTE — Progress Notes (Signed)
Subjective:        Danielle Holden is a 44 y.o. female here for a routine exam.  Current complaints: none.    Personal health questionnaire:  Is patient [name], have a family history of breast and/or ovarian cancer: no Is there a family history of uterine cancer diagnosed at age < 69, gastrointestinal cancer, urinary tract cancer, family member who is a Personnel officer syndrome-associated carrier: no Is the patient overweight and hypertensive, family history of diabetes, personal history of gestational diabetes, preeclampsia or PCOS: no Is patient over 66, have PCOS,  family history of premature CHD under age 66, diabetes, smoke, have hypertension or peripheral artery disease:  no At any time, has a partner hit, kicked or otherwise hurt or frightened you?: no Over the past 2 weeks, have you felt down, depressed or hopeless?: no Over the past 2 weeks, have you felt little interest or pleasure in doing things?:no   Gynecologic History Patient's last menstrual period was 09/09/2014 (exact date). Contraception: tubal ligation Last Pap: 2015. Results were: normal Last mammogram: 2016. Results were: normal  Obstetric History OB History  Gravida Para Term Preterm AB SAB TAB Ectopic Multiple Living  4 3 3  1 1    3     # Outcome Date GA Lbr Len/2nd Weight Sex Delivery Anes PTL Lv  4 SAB 2009 [redacted]w[redacted]d         3 Term 1993 [redacted]w[redacted]d   [redacted]w[redacted]d  2 Term 08/1990 [redacted]w[redacted]d   F Vag-Spont   Y  1 Term 1989    M CS-LTranv   Y      Past Medical History  Diagnosis Date  . Anxiety   . Arthritis   . Anemia   . Lupus   . Hypoglycemia   . Migraine     Past Surgical History  Procedure Laterality Date  . Cesarean section    . Appendectomy    . Tubal ligation       Current outpatient prescriptions:  .  nitrofurantoin, macrocrystal-monohydrate, (MACROBID) 100 MG capsule, Take 1 capsule (100 mg total) by mouth 2 (two) times daily., Disp: 14 capsule, Rfl: 0 Allergies  Allergen Reactions  .  Anesthetics, Amide Anaphylaxis and Other (See Comments)    Heart stopped and stopped breathing  . Trazodone Anaphylaxis    Tongue swells.  . Trazodone And Nefazodone Anaphylaxis    Tongue swells.    History  Substance Use Topics  . Smoking status: Never Smoker   . Smokeless tobacco: Never Used  . Alcohol Use: No    Family History  Problem Relation Age of Onset  . Cancer Maternal Grandmother   . Cancer Paternal Grandmother   . Suicidality Father       Review of Systems  Constitutional: negative for fatigue and weight loss Respiratory: negative for cough and wheezing Cardiovascular: negative for chest pain, fatigue and palpitations Gastrointestinal: negative for abdominal pain and change in bowel habits Musculoskeletal:negative for myalgias Neurological: negative for gait problems and tremors Behavioral/Psych: negative for abusive relationship, depression Endocrine: negative for temperature intolerance   Genitourinary:negative for abnormal menstrual periods, genital lesions, hot flashes, sexual problems and vaginal discharge Integument/breast: negative for breast lump, breast tenderness, nipple discharge and skin lesion(s)    Objective:       BP 120/68 mmHg  Pulse 65  Temp(Src) 97.5 F (36.4 C)  Wt 143 lb (64.864 kg)  LMP 09/09/2014 (Exact Date) General:   alert  Skin:   no  rash or abnormalities  Lungs:   clear to auscultation bilaterally  Heart:   regular rate and rhythm, S1, S2 normal, no murmur, click, rub or gallop  Breasts:   normal without suspicious masses, skin or nipple changes or axillary nodes  Abdomen:  normal findings: no organomegaly, soft, non-tender and no hernia  Pelvis:  External genitalia: normal general appearance Urinary system: urethral meatus normal and bladder without fullness, nontender Vaginal: normal without tenderness, induration or masses Cervix: normal appearance Adnexa: normal bimanual exam Uterus: anteverted and non-tender, normal  size   Lab Review Urine pregnancy test Labs reviewed yes Radiologic studies reviewed yes    Assessment:    Healthy female exam.    AUB.  High dose Depo Provera given.   Plan:   Repeat High Dose Depo in 2 weeks.  Education reviewed: depression evaluation, low fat, low cholesterol diet, safe sex/STD prevention, self breast exams, weight bearing exercise and management of AUB. Contraception: tubal ligation. Follow up in: 2 weeks.   No orders of the defined types were placed in this encounter.   Orders Placed This Encounter  Procedures  . Comprehensive metabolic panel  . TSH  . CBC  . HIV antibody  . Hepatitis B surface antigen  . RPR  . Hepatitis C antibody  . Lipid Profile

## 2014-09-23 LAB — HEPATITIS B SURFACE ANTIGEN: Hepatitis B Surface Ag: NEGATIVE

## 2014-09-23 LAB — RPR

## 2014-09-23 LAB — HEPATITIS C ANTIBODY: HCV AB: NEGATIVE

## 2014-09-23 LAB — HIV ANTIBODY (ROUTINE TESTING W REFLEX): HIV: NONREACTIVE

## 2014-09-24 LAB — PAP IG AND HPV HIGH-RISK: HPV DNA HIGH RISK: NOT DETECTED

## 2014-11-01 ENCOUNTER — Ambulatory Visit: Payer: Medicaid Other | Admitting: Nurse Practitioner

## 2014-11-04 ENCOUNTER — Telehealth: Payer: Self-pay | Admitting: *Deleted

## 2014-11-04 NOTE — Telephone Encounter (Signed)
I called and LMVM for pt that was calling to reschedule appt (she had cancelled on 11-01-14).

## 2014-11-12 HISTORY — PX: IUD REMOVAL: SHX5392

## 2015-02-18 IMAGING — CT CT ABD-PELV W/O CM
1 series · 15 of 23 positions shown, 20 images · non-contrast
Comparison: None.

CLINICAL DATA: Right flank pain for 3 days. Painful urination and
increased frequency.

EXAM:
CT ABDOMEN AND PELVIS WITHOUT CONTRAST
TECHNIQUE: Multidetector CT imaging of the abdomen and pelvis was performed
following the standard protocol without intravenous contrast.

[Series 6: lung · axial · 0.74mm/px · z∈[+318,+418]mm · 15 of 23 slices shown, 20 images]
[im 2/23  soft-tissue]
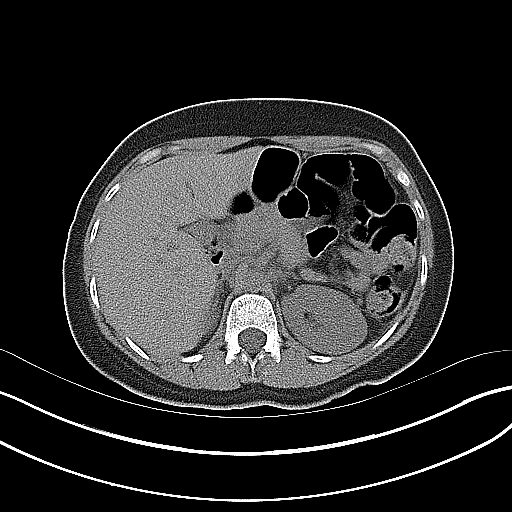
[im 2/23  bone]
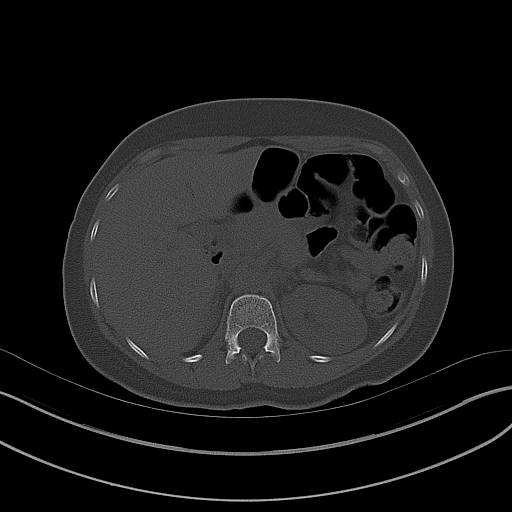
[im 4/23  soft-tissue]
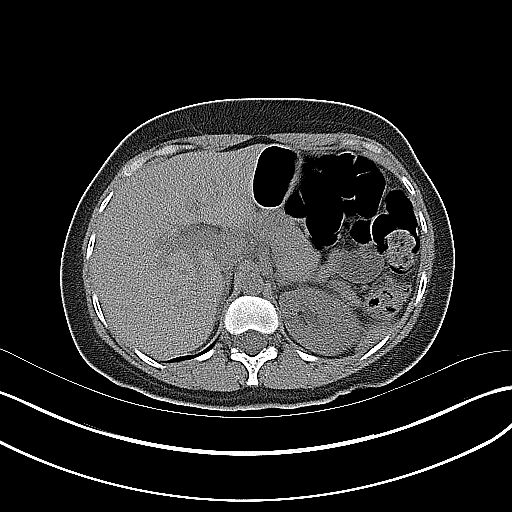
[im 5/23  soft-tissue]
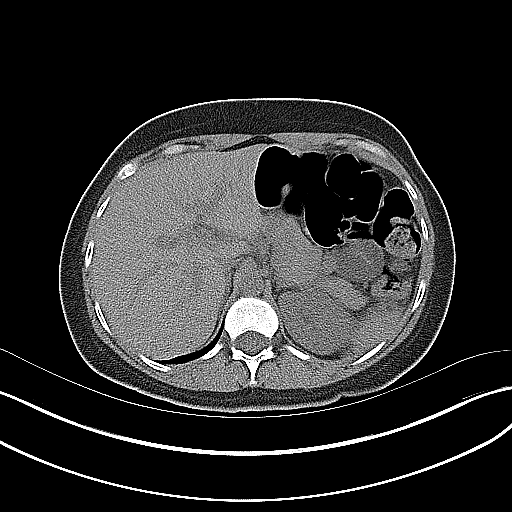
[im 7/23  soft-tissue]
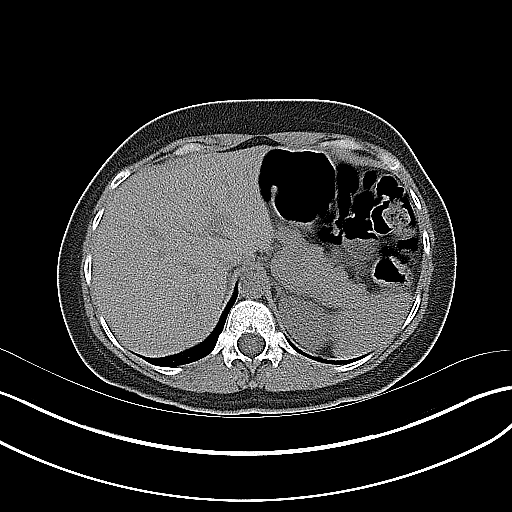
[im 8/23  soft-tissue]
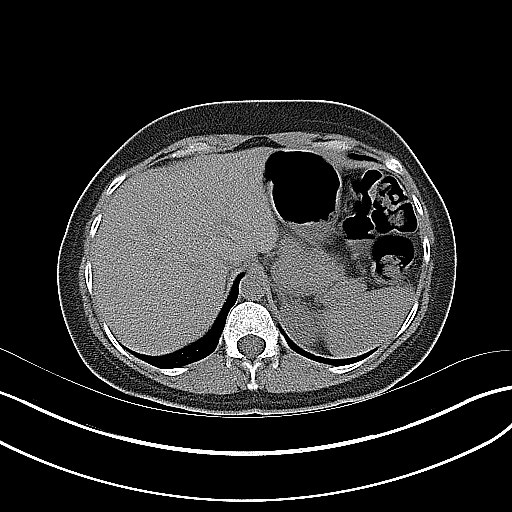
[im 10/23  soft-tissue]
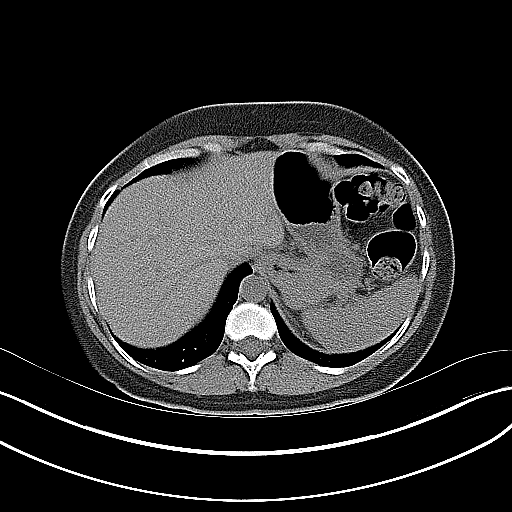
[im 11/23  soft-tissue]
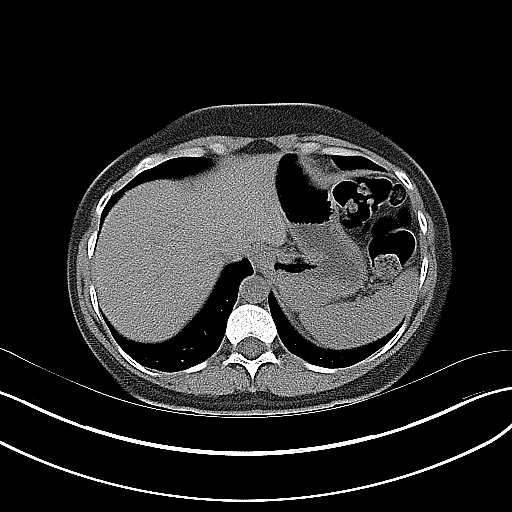
[im 13/23  soft-tissue]
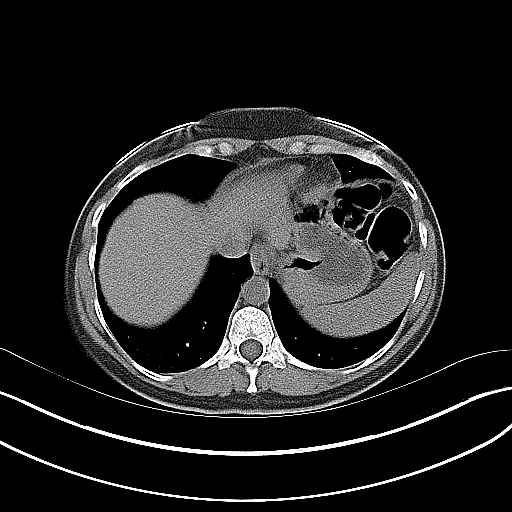
[im 14/23  soft-tissue]
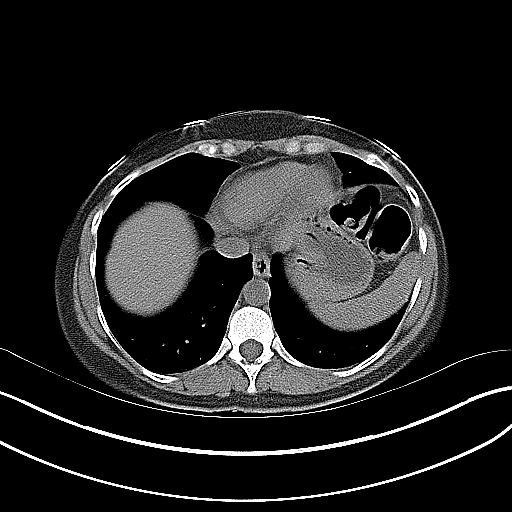
[im 14/23  bone]
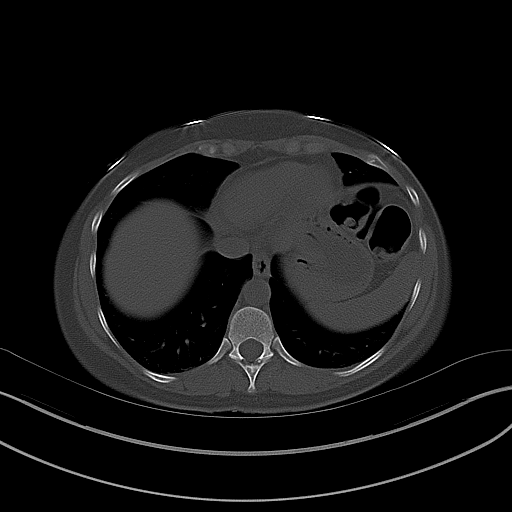
[im 16/23  soft-tissue]
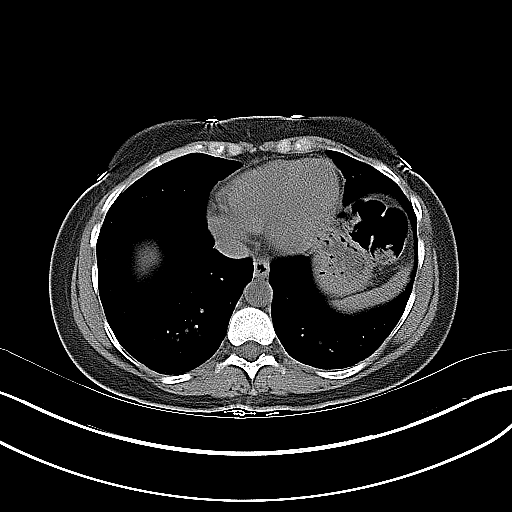
[im 17/23  soft-tissue]
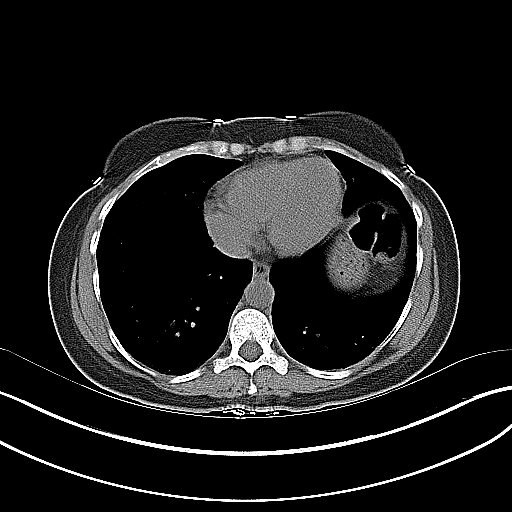
[im 19/23  soft-tissue]
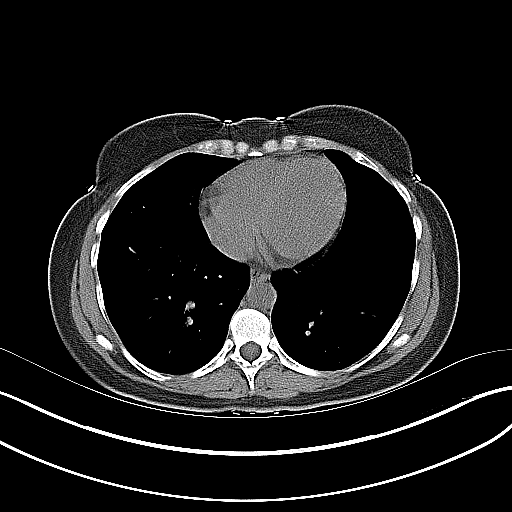
[im 19/23  lung]
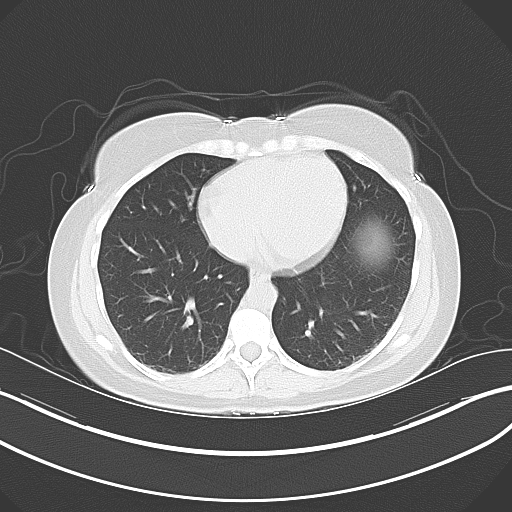
[im 20/23  soft-tissue]
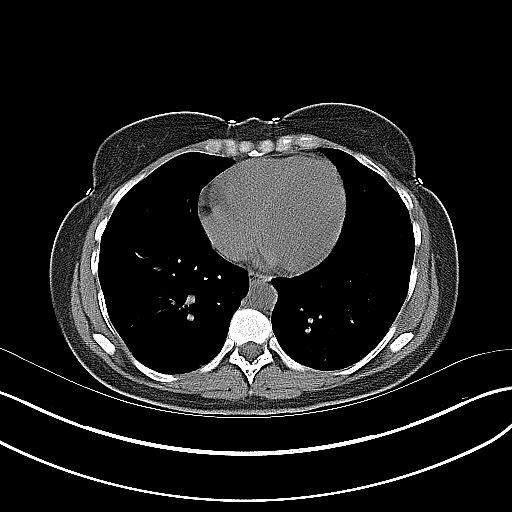
[im 20/23  lung]
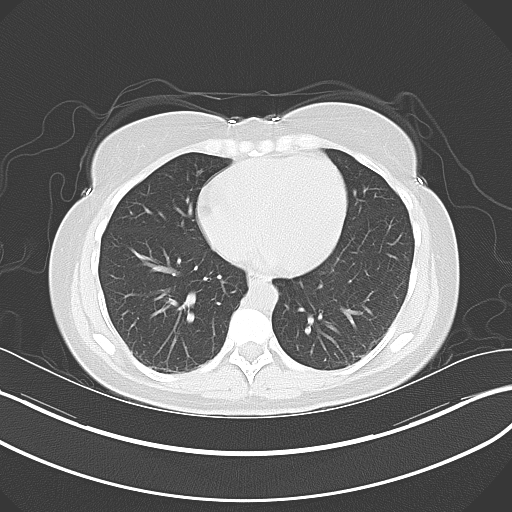
[im 21/23  lung]
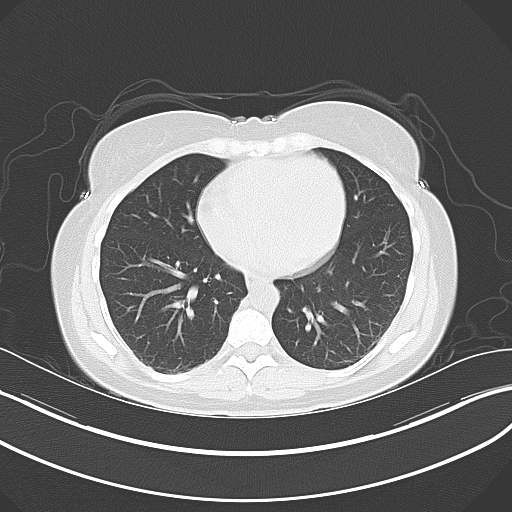
[im 22/23  soft-tissue]
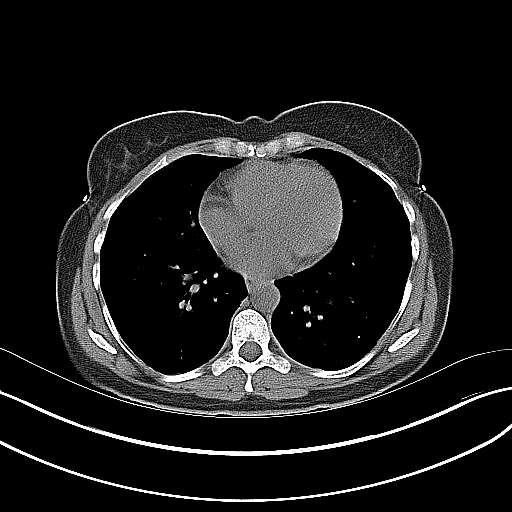
[im 22/23  lung]
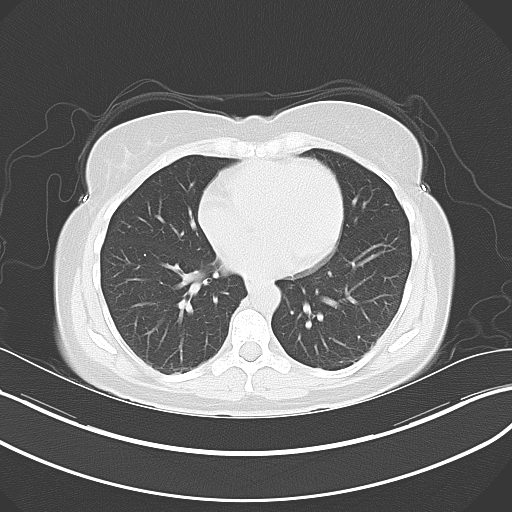

[15 of 23 positions shown; findings below may reference images not displayed]

FINDINGS: Lung bases show no acute findings. Heart size normal. No pericardial
or pleural effusion.

Liver, gallbladder, adrenal glands, kidneys, spleen, pancreas,
stomach and bowel are unremarkable. Uterus and ovaries are
visualized. No pathologically enlarged lymph nodes. No free fluid.
Bladder is unremarkable. No worrisome lytic or sclerotic lesions.
IMPRESSION: Negative. No findings to explain the patient's given symptoms.

## 2015-02-25 ENCOUNTER — Encounter: Payer: Self-pay | Admitting: Allergy and Immunology

## 2015-02-25 ENCOUNTER — Ambulatory Visit (INDEPENDENT_AMBULATORY_CARE_PROVIDER_SITE_OTHER): Payer: Medicaid Other | Admitting: Allergy and Immunology

## 2015-02-25 VITALS — BP 106/70 | HR 68 | Temp 98.0°F | Resp 16

## 2015-02-25 DIAGNOSIS — R05 Cough: Secondary | ICD-10-CM

## 2015-02-25 DIAGNOSIS — R059 Cough, unspecified: Secondary | ICD-10-CM

## 2015-02-25 DIAGNOSIS — J310 Chronic rhinitis: Secondary | ICD-10-CM | POA: Diagnosis not present

## 2015-02-25 DIAGNOSIS — R062 Wheezing: Secondary | ICD-10-CM | POA: Diagnosis not present

## 2015-02-25 DIAGNOSIS — J309 Allergic rhinitis, unspecified: Secondary | ICD-10-CM | POA: Diagnosis not present

## 2015-02-25 DIAGNOSIS — J3089 Other allergic rhinitis: Secondary | ICD-10-CM

## 2015-02-25 MED ORDER — ALBUTEROL SULFATE HFA 108 (90 BASE) MCG/ACT IN AERS: 2.0000 | INHALATION_SPRAY | RESPIRATORY_TRACT | Status: DC | PRN

## 2015-02-25 MED ORDER — BECLOMETHASONE DIPROPIONATE 80 MCG/ACT IN AERS: 2.0000 | INHALATION_SPRAY | Freq: Two times a day (BID) | RESPIRATORY_TRACT | Status: DC

## 2015-02-25 MED ORDER — LEVALBUTEROL HCL 1.25 MG/3ML IN NEBU
1.2500 mg | INHALATION_SOLUTION | Freq: Once | RESPIRATORY_TRACT | Status: AC
  Administered 2015-02-25: 1.25 mg via RESPIRATORY_TRACT

## 2015-02-25 MED ORDER — PREDNISONE 10 MG PO TABS: 20.0000 mg | ORAL_TABLET | Freq: Every day | ORAL | Status: DC

## 2015-02-25 MED ORDER — IPRATROPIUM BROMIDE 0.02 % IN SOLN
0.5000 mg | Freq: Once | RESPIRATORY_TRACT | Status: AC
  Administered 2015-02-25: 0.5 mg via RESPIRATORY_TRACT

## 2015-02-25 NOTE — Patient Instructions (Addendum)
Take Home Sheet  1. Avoidance:  Dust  Mite and Mold   2. Antihistamine: Claritin 5-10 mg by mouth once daily for runny nose or itching.   3. Nasal Spray: Rhinocort 1 spray(s) each nostril once daily for stuffy nose or drainage.    4. Inhalers:  Rescue: ProAir 2 puffs every 4 hours as needed for cough or wheeze.       -May use 2 puffs 10-20 minutes prior to exercise.   Preventative: QVAR 2 puffs twice daily (Rinse, gargle, and spit out after use).   5. Other: Prednisone 20mg  once daily for 5 days.   6. Nasal Saline wash once daily as directed.   7. Follow up Visit: 2 months or sooner if needed.    Websites that have reliable Patient information: 1. American Academy of Asthma, Allergy, & Immunology: www.aaaai.org 2. Food Allergy Network: www.foodallergy.org 3. Mothers of Asthmatics: www.aanma.org 4. [facility]: 5. American College of Allergy, Asthma, & Immunology: https://www.strong.com/ or www.acaai.org    Control of Mold Allergen  Mold and fungi can grow on a variety of surfaces provided certain temperature and moisture conditions exist.  Outdoor molds grow on plants, decaying vegetation and soil.  The major outdoor mold, Alternaria dn Cladosporium, are found in very high numbers during hot and dry conditions.  Generally, a late Summer - Fall peak is seen for common outdoor fungal spores.  Rain will temporarily lower outdoor mold spore count, but counts rise rapidly when the rainy period ends.  The most important indoor molds are Aspergillus and Penicillium.  Dark, humid and poorly ventilated basements are ideal sites for mold growth.  The next most common sites of mold growth are the bathroom and the kitchen.  Outdoor 09-18-1995 1. Use air conditioning and keep windows closed 2. Avoid exposure to decaying vegetation. 3. Avoid leaf raking. 4. Avoid grain handling. 5. Consider wearing a face mask if working in moldy  areas.  Indoor Mold Control 1. Maintain humidity below 50%. 2. Clean washable surfaces with 5% bleach solution. 3. Remove sources e.g. Contaminated carpets.    Control of House Dust Mite Allergen  House dust mites play a major role in allergic asthma and rhinitis.  They occur in environments with high humidity wherever human skin, the food for dust mites is found. High levels have been detected in dust obtained from mattresses, pillows, carpets, upholstered furniture, bed covers, clothes and soft toys.  The principal allergen of the house dust mite is found in its feces.  A gram of dust may contain 1,000 mites and 250,000 fecal particles.  Mite antigen is easily measured in the air during house cleaning activities.  1. Encase mattresses, including the box spring, and pillow, in an air tight cover.  Seal the zipper end of the encased mattresses with wide adhesive tape. 2. Wash the bedding in water of 130 degrees Farenheit weekly.  Avoid cotton comforters/quilts and flannel bedding: the most ideal bed covering is the dacron comforter. 3. Remove all upholstered furniture from the bedroom. 4. Remove carpets, carpet padding, rugs, and non-washable window drapes from the bedroom.  Wash drapes weekly or use plastic window coverings. 5. Remove all non-washable stuffed toys from the bedroom.  Wash stuffed toys weekly. 6. Have the room cleaned frequently with a vacuum cleaner and a damp dust-mop.  The patient should not be in a room which is being cleaned and should wait 1 hour after cleaning before going into the room. 7.  Close and seal all heating outlets in the bedroom.  Otherwise, the room will become filled with dust-laden air.  An electric heater can be used to heat the room. 8. Reduce indoor humidity to less than 50%.  Do not use a humidifier.

## 2015-02-25 NOTE — Progress Notes (Signed)
FOLLOW UP NOTE  RE: CARMELLIA KREISLER MRN: [id number] DOB: Oct 07, 1970 [facility] [place] [address] [place] [address] Date of Office Visit: 02/25/2015  Subjective:  Danielle Holden is a 44 y.o. female who presents today for Nasal Congestion and Cough.   HPI: Shanon returns to the office in follow-up of mixed rhinitis and cough with previous report of asthma diagnosis.  She did not follow-up after her initial evaluation in January and discontinued QVAR after 2 weeks, though unclear why.  She reports intermittent congestion, postnasal drip, sneezing and cough with intermittent chest congestion.  She may have had wheeze or chest tightness, though this is not daily or currently.  She does find [employer] beneficial and is interested in additional management.  It is unclear why she did not follow up as discussed in January visit given symptoms.  She denies fever, headache or sore throat, but has had postnasal drip with throat irritation.  Her symptoms have been worse in the last month with the fluctuant weather patterns.  She denies emergency department or urgent care visits, prednisone or antibiotics.  There have been no other new medical issues and continues to follow her arthralgias but no new inclusive rheumatological diagnosis or medication management.  She did discontinue Claritin after visit with another physician as discussion for its potential trigger for epistaxis.  She has not noted less blood tinged mucus off the Claritin.  Drug Allergies: Allergies  Allergen Reactions  . Anesthetics, Amide Anaphylaxis and Other (See Comments)    Heart stopped and stopped breathing  . Trazodone Anaphylaxis    Tongue swells.  . Trazodone And Nefazodone Anaphylaxis    Tongue swells.  . Effexor [Venlafaxine] Hives  . Nortriptyline Hives    Objective:   Filed Vitals:   02/25/15 1109  BP: 106/70  Pulse: 68  Temp: 98 F (36.7 C)  Resp:  16  O2 sat     98% Physical Exam  Constitutional: She is well-developed, well-nourished, and in no distress.  HENT:  Head: Atraumatic.  Eyes: Conjunctivae are normal.  Neck: Neck supple.  Cardiovascular: Normal rate, S1 normal and S2 normal.   No murmur heard. Pulmonary/Chest: Effort normal. She has no wheezes. She has no rhonchi. She has no rales.  Post Xopenex/Atrovent neb:  Continues to be clear breath sounds--patient reports improved aeration.  Lymphadenopathy:    She has no cervical adenopathy.    Diagnostics: FVC 2.56.--78%, FEV1 1.97--72%, FEF 25-75 1.8 to-as 61%; Significant postbronchodilator improvement  FVC 2.70--82%, FEV1 2.25--82%, FEF 25-75 2.45--82%.  Assessment:   1. Cough   2. Wheeze   3. Chronic rhinitis   4. Perennial allergic rhinitis    Plan:   Meds ordered this encounter  Medications  . ipratropium (ATROVENT) nebulizer solution 0.5 mg    Sig:   . levalbuterol (XOPENEX) nebulizer solution 1.25 mg    Sig:   . predniSONE (DELTASONE) 10 MG tablet    Sig: Take 2 tablets (20 mg total) by mouth daily with breakfast.    Dispense:  10 tablet    Refill:  0    Order Specific Question:  Lot Number?    Answer:  [id number]    Order Specific Question:  Expiration Date?    Answer:  09/10/2016    Order Specific Question:  NDC    Answer:  3875-6433-29 [[id number]]    Order Specific Question:  Quantity    Answer:  10  . albuterol (  PROAIR HFA) 108 (90 BASE) MCG/ACT inhaler    Sig: Inhale 2 puffs into the lungs every 4 (four) hours as needed for wheezing or shortness of breath.    Dispense:  1 Inhaler    Refill:  1  . beclomethasone (QVAR) 80 MCG/ACT inhaler    Sig: Inhale 2 puffs into the lungs 2 (two) times daily.    Dispense:  1 Inhaler    Refill:  3   Patient Instructions  Take Home Sheet  1. Avoidance:  Dust  Mite and Mold   2. Antihistamine: Claritin 5-10 mg by mouth once daily for runny nose or itching.   3. Nasal Spray: Rhinocort 1 spray(s) each  nostril once daily for stuffy nose or drainage.    4. Inhalers:  Rescue: ProAir 2 puffs every 4 hours as needed for cough or wheeze.       -May use 2 puffs 10-20 minutes prior to exercise.   Preventative: QVAR 2 puffs twice daily (Rinse, gargle, and spit out after use).   5. Other: Prednisone 20mg  once daily for 5 days.   6. Nasal Saline wash once daily as directed.   7. Follow up Visit: 2 months or sooner if needed.    Websites that have reliable Patient information: 1. American Academy of Asthma, Allergy, & Immunology: www.aaaai.org 2. Food Allergy Network: www.foodallergy.org 3. Mothers of Asthmatics: www.aanma.org 4. [facility]: 5. American College of Allergy, Asthma, & Immunology: https://www.strong.com/ or www.acaai.org       Jasmaine Rochel [email or web address], MD   cc: Willa Rough, MD

## 2015-03-04 ENCOUNTER — Ambulatory Visit: Payer: Medicaid Other | Admitting: Allergy and Immunology

## 2015-04-19 ENCOUNTER — Ambulatory Visit (INDEPENDENT_AMBULATORY_CARE_PROVIDER_SITE_OTHER): Payer: Medicaid Other | Admitting: Obstetrics

## 2015-04-19 ENCOUNTER — Other Ambulatory Visit: Payer: Self-pay | Admitting: Obstetrics

## 2015-04-19 ENCOUNTER — Encounter: Payer: Self-pay | Admitting: Obstetrics

## 2015-04-19 VITALS — BP 125/80 | HR 69 | Temp 98.1°F | Wt 148.0 lb

## 2015-04-19 DIAGNOSIS — B373 Candidiasis of vulva and vagina: Secondary | ICD-10-CM

## 2015-04-19 DIAGNOSIS — R399 Unspecified symptoms and signs involving the genitourinary system: Secondary | ICD-10-CM | POA: Diagnosis not present

## 2015-04-19 DIAGNOSIS — B3731 Acute candidiasis of vulva and vagina: Secondary | ICD-10-CM

## 2015-04-19 DIAGNOSIS — N915 Oligomenorrhea, unspecified: Secondary | ICD-10-CM

## 2015-04-19 DIAGNOSIS — Z3202 Encounter for pregnancy test, result negative: Secondary | ICD-10-CM

## 2015-04-19 DIAGNOSIS — N926 Irregular menstruation, unspecified: Secondary | ICD-10-CM

## 2015-04-19 LAB — POCT URINALYSIS DIPSTICK
BILIRUBIN UA: NEGATIVE
Blood, UA: NEGATIVE
Glucose, UA: NEGATIVE
Leukocytes, UA: NEGATIVE
Nitrite, UA: NEGATIVE
PH UA: 5
Spec Grav, UA: 1.015
Urobilinogen, UA: NEGATIVE

## 2015-04-19 LAB — POCT URINE PREGNANCY: PREG TEST UR: NEGATIVE

## 2015-04-19 NOTE — Addendum Note (Signed)
Addended by: Elby Beck F on: 04/19/2015 05:07 PM   Modules accepted: Orders

## 2015-04-19 NOTE — Addendum Note (Signed)
Addended by: Elby Beck F on: 04/19/2015 05:13 PM   Modules accepted: Orders

## 2015-04-19 NOTE — Progress Notes (Signed)
Patient ID: Danielle Holden, female   DOB: [birth year], 44 y.o.   MRN: [id number]  Chief Complaint  Patient presents with  . Gynecologic Exam    Patient has not had her cycle for 2 months- patient states she is regular. She also thinks she has a UTI and yeast.    HPI Danielle Holden is a 44 y.o. female.  No period for 2 months.  Periods usually regular.  Also has urinary odor and feels that she has a UTI.  Has yellowish discharge but no itching or odor. HPI  Past Medical History  Diagnosis Date  . Anxiety   . Arthritis   . Anemia   . Lupus (HCC)   . Hypoglycemia   . Migraine   . Asthma     Past Surgical History  Procedure Laterality Date  . Cesarean section    . Appendectomy    . Tubal ligation    . Iud removal [name] 11/12/2014    Family History  Problem Relation Age of Onset  . Cancer Maternal Grandmother   . Cancer Paternal Grandmother   . Suicidality Father     Social History Social History  Substance Use Topics  . Smoking status: Never Smoker   . Smokeless tobacco: Never Used  . Alcohol Use: No    Allergies  Allergen Reactions  . Anesthetics, Amide Anaphylaxis and Other (See Comments)    Heart stopped and stopped breathing  . Trazodone Anaphylaxis    Tongue swells.  . Trazodone And Nefazodone Anaphylaxis    Tongue swells.  . Effexor [Venlafaxine] Hives  . Nortriptyline Hives    Current Outpatient Prescriptions  Medication Sig Dispense Refill  . albuterol (PROAIR HFA) 108 (90 BASE) MCG/ACT inhaler Inhale 2 puffs into the lungs every 4 (four) hours as needed for wheezing or shortness of breath. 1 Inhaler 1  . beclomethasone (QVAR) 80 MCG/ACT inhaler Inhale 2 puffs into the lungs 2 (two) times daily. 1 Inhaler 3  . Ibuprofen 200 MG CAPS Take 600 mg by mouth as needed.    . Multiple Vitamin (MULTI VITAMIN DAILY PO) Take by mouth.    . predniSONE (DELTASONE) 10 MG tablet Take 2 tablets (20 mg total) by mouth daily with breakfast. 10 tablet 0  . vitamin B-12  (CYANOCOBALAMIN) 100 MCG tablet Take 100 mcg by mouth daily.     No current facility-administered medications for this visit.    Review of Systems Review of Systems Constitutional: negative for fatigue and weight loss Respiratory: negative for cough and wheezing Cardiovascular: negative for chest pain, fatigue and palpitations Gastrointestinal: negative for abdominal pain and change in bowel habits Genitourinary:positive for yellowish discharge and malodorous urine Integument/breast: negative for nipple discharge Musculoskeletal:negative for myalgias Neurological: negative for gait problems and tremors Behavioral/Psych: negative for abusive relationship, depression Endocrine: negative for temperature intolerance     Blood pressure 125/80, pulse 69, temperature 98.1 F (36.7 C), weight 148 lb (67.132 kg), last menstrual period 03/03/2015.  Physical Exam Physical Exam General:   alert  Skin:   no rash or abnormalities  Lungs:   clear to auscultation bilaterally  Heart:   regular rate and rhythm, S1, S2 normal, no murmur, click, rub or gallop  Breasts:   normal without suspicious masses, skin or nipple changes or axillary nodes  Abdomen:  normal findings: no organomegaly, soft, non-tender and no hernia  Pelvis:  External genitalia: normal general appearance Urinary system: urethral meatus normal and bladder without fullness, nontender Vaginal: normal without tenderness,  induration or masses Cervix: normal appearance Adnexa: normal bimanual exam Uterus: anteverted and non-tender, normal size      Data Reviewed Labs  Assessment     No period for the last 2 months. UTI symptoms     Plan    Ultrasound ordered.  Ill probably do Provera challenge if normal ultrasound. F/U in 2 weeks.   Orders Placed This Encounter  Procedures  . US Pelvis Complete    Standing Status: Future     Number of Occurrences:      Standing Expiration Date: 06/19/2016    Order Specific Question:   Reason for Exam (SYMPTOM  OR DIAGNOSIS REQUIRED)    Answer:  Amenorrhea    Order Specific Question:  Preferred imaging location?    Answer:  [facility]  . US Transvaginal Non-OB    Standing Status: Future     Number of Occurrences:      Standing Expiration Date: 06/19/2016    Order Specific Question:  Reason for Exam (SYMPTOM  OR DIAGNOSIS REQUIRED)    Answer:  Amenorrhea    Order Specific Question:  Preferred imaging location?    Answer:  [facility]  . POCT urinalysis dipstick  . POCT urine pregnancy   No orders of the defined types were placed in this encounter.

## 2015-04-19 NOTE — Addendum Note (Signed)
Addended by: Coral Ceo A on: 04/19/2015 03:50 PM   Modules accepted: Orders

## 2015-04-20 ENCOUNTER — Ambulatory Visit (HOSPITAL_COMMUNITY)
Admission: RE | Admit: 2015-04-20 | Discharge: 2015-04-20 | Disposition: A | Discharge status: Home / Self Care | Payer: Medicaid Other | Source: Ambulatory Visit | Attending: Obstetrics | Admitting: Obstetrics

## 2015-04-20 DIAGNOSIS — N915 Oligomenorrhea, unspecified: Secondary | ICD-10-CM

## 2015-04-20 DIAGNOSIS — N854 Malposition of uterus: Secondary | ICD-10-CM | POA: Insufficient documentation

## 2015-04-21 LAB — URINE CULTURE: Colony Count: 25000

## 2015-04-22 ENCOUNTER — Telehealth: Payer: Self-pay | Admitting: *Deleted

## 2015-04-22 LAB — SURESWAB BACTERIAL VAGINOSIS/ITIS
ATOPOBIUM VAGINAE: NOT DETECTED Log (cells/mL)
C. albicans, DNA: NOT DETECTED
C. glabrata, DNA: NOT DETECTED
C. parapsilosis, DNA: NOT DETECTED
C. tropicalis, DNA: NOT DETECTED
Gardnerella vaginalis: <4.7 Log (cells/mL)
LACTOBACILLUS SPECIES: NOT DETECTED Log (cells/mL)
MEGASPHAERA SPECIES: NOT DETECTED Log (cells/mL)
T. vaginalis RNA, QL TMA: NOT DETECTED

## 2015-04-22 NOTE — Telephone Encounter (Signed)
Patient contacted the office requesting lab results. Patient advised urine culture is the only test resulted at this time the sure swab should be back on Monday. Patient advised urine culture is WNL. Patient verbalized understanding.

## 2015-05-03 ENCOUNTER — Encounter: Payer: Self-pay | Admitting: Obstetrics

## 2015-05-03 ENCOUNTER — Ambulatory Visit (INDEPENDENT_AMBULATORY_CARE_PROVIDER_SITE_OTHER): Payer: Medicaid Other | Admitting: Obstetrics

## 2015-05-03 VITALS — BP 118/69 | HR 78 | Temp 98.0°F | Ht 62.0 in | Wt 148.0 lb

## 2015-05-03 DIAGNOSIS — R102 Pelvic and perineal pain: Secondary | ICD-10-CM | POA: Diagnosis not present

## 2015-05-03 DIAGNOSIS — N915 Oligomenorrhea, unspecified: Secondary | ICD-10-CM

## 2015-05-03 DIAGNOSIS — R300 Dysuria: Secondary | ICD-10-CM

## 2015-05-03 NOTE — Progress Notes (Signed)
Patient ID: Danielle Holden, female   DOB: [birth year], 44 y.o.   MRN: [id number]  Chief Complaint  Patient presents with  . Follow-up    ultrasound results     HPI Danielle Holden is a 44 y.o. female.  Presents for ultrasound results.  Continues to have pelvic pain and dysuria.  Menstrual cycles getting irregular, but no intermenstrual bleeding. HPI  Past Medical History  Diagnosis Date  . Anxiety   . Arthritis   . Anemia   . Lupus (HCC)   . Hypoglycemia   . Migraine   . Asthma     Past Surgical History  Procedure Laterality Date  . Cesarean section    . Appendectomy    . Tubal ligation    . Iud removal [name] 11/12/2014    Family History  Problem Relation Age of Onset  . Cancer Maternal Grandmother   . Cancer Paternal Grandmother   . Suicidality Father     Social History Social History  Substance Use Topics  . Smoking status: Never Smoker   . Smokeless tobacco: Never Used  . Alcohol Use: No    Allergies  Allergen Reactions  . Anesthetics, Amide Anaphylaxis and Other (See Comments)    Heart stopped and stopped breathing  . Trazodone Anaphylaxis    Tongue swells.  . Trazodone And Nefazodone Anaphylaxis    Tongue swells.  . Effexor [Venlafaxine] Hives  . Nortriptyline Hives    Current Outpatient Prescriptions  Medication Sig Dispense Refill  . albuterol (PROAIR HFA) 108 (90 BASE) MCG/ACT inhaler Inhale 2 puffs into the lungs every 4 (four) hours as needed for wheezing or shortness of breath. 1 Inhaler 1  . beclomethasone (QVAR) 80 MCG/ACT inhaler Inhale 2 puffs into the lungs 2 (two) times daily. 1 Inhaler 3  . Ibuprofen 200 MG CAPS Take 600 mg by mouth as needed.    . Multiple Vitamin (MULTI VITAMIN DAILY PO) Take by mouth.    . vitamin B-12 (CYANOCOBALAMIN) 100 MCG tablet Take 100 mcg by mouth daily.     No current facility-administered medications for this visit.    Review of Systems Review of Systems Constitutional: negative for fatigue and weight  loss Respiratory: negative for cough and wheezing Cardiovascular: negative for chest pain, fatigue and palpitations Gastrointestinal: negative for abdominal pain and change in bowel habits Genitourinary: positive for pelvic pain and painful urination \\\Integument/breast: negative for nipple discharge Musculoskeletal:negative for myalgias Neurological: negative for gait problems and tremors Behavioral/Psych: negative for abusive relationship, depression Endocrine: negative for temperature intolerance     Blood pressure 118/69, pulse 78, temperature 98 F (36.7 C), height 5\' 2"  (1.575 m), weight 148 lb (67.132 kg), last menstrual period 03/03/2015.  Physical Exam Physical Exam Deferred  100% of 10 min visit spent on counseling and coordination of care.   Data Reviewed Ultrasound Labs  Assessment     Pelvic pain.  Normal ultrasound Dysuria.  Normal Urinalysis    Plan    Referred to Urogynecology. F/U prn  Orders Placed This Encounter  Procedures  . Ambulatory referral to Urogynecology    Referral Priority:  Routine    Referral Type:  Consultation    Referral Reason:  Specialty Services Required    Requested Specialty:  Urology    Number of Visits Requested:  1   No orders of the defined types were placed in this encounter.

## 2015-08-17 ENCOUNTER — Other Ambulatory Visit: Payer: Self-pay

## 2015-08-17 DIAGNOSIS — Z1231 Encounter for screening mammogram for malignant neoplasm of breast: Secondary | ICD-10-CM

## 2015-09-21 ENCOUNTER — Ambulatory Visit
Admission: RE | Admit: 2015-09-21 | Discharge: 2015-09-21 | Disposition: A | Discharge status: Home / Self Care | Payer: Medicaid Other | Source: Ambulatory Visit

## 2015-09-21 DIAGNOSIS — Z1231 Encounter for screening mammogram for malignant neoplasm of breast: Secondary | ICD-10-CM

## 2015-10-26 ENCOUNTER — Encounter: Payer: Self-pay | Admitting: Obstetrics

## 2015-10-26 ENCOUNTER — Ambulatory Visit (INDEPENDENT_AMBULATORY_CARE_PROVIDER_SITE_OTHER): Payer: Medicaid Other | Admitting: Obstetrics

## 2015-10-26 VITALS — BP 112/70 | HR 69 | Temp 98.9°F | Wt 151.0 lb

## 2015-10-26 DIAGNOSIS — Z1389 Encounter for screening for other disorder: Secondary | ICD-10-CM | POA: Diagnosis not present

## 2015-10-26 DIAGNOSIS — Z0000 Encounter for general adult medical examination without abnormal findings: Secondary | ICD-10-CM

## 2015-10-26 DIAGNOSIS — Z01419 Encounter for gynecological examination (general) (routine) without abnormal findings: Secondary | ICD-10-CM

## 2015-10-26 LAB — POCT URINALYSIS DIPSTICK
BILIRUBIN UA: NEGATIVE
GLUCOSE UA: NEGATIVE
Ketones, UA: NEGATIVE
Leukocytes, UA: NEGATIVE
Nitrite, UA: NEGATIVE
PH UA: 5
Protein, UA: NEGATIVE
RBC UA: NEGATIVE
Spec Grav, UA: 1.01
Urobilinogen, UA: NEGATIVE

## 2015-10-27 ENCOUNTER — Encounter: Payer: Self-pay | Admitting: Obstetrics

## 2015-10-27 LAB — COMPREHENSIVE METABOLIC PANEL
ALBUMIN: 4.2 g/dL (ref 3.5–5.5)
ALK PHOS: 84 IU/L (ref 39–117)
ALT: 12 IU/L (ref 0–32)
AST: 15 IU/L (ref 0–40)
Albumin/Globulin Ratio: 1.4 (ref 1.2–2.2)
BUN/Creatinine Ratio: 17 (ref 9–23)
BUN: 12 mg/dL (ref 6–24)
Bilirubin Total: <0.2 mg/dL (ref 0.0–1.2)
CALCIUM: 8.9 mg/dL (ref 8.7–10.2)
CO2: 20 mmol/L (ref 18–29)
CREATININE: 0.71 mg/dL (ref 0.57–1.00)
Chloride: 98 mmol/L (ref 96–106)
GFR calc Af Amer: 118 mL/min/{1.73_m2} (ref >59)
GFR, EST NON AFRICAN AMERICAN: 102 mL/min/{1.73_m2} (ref >59)
GLUCOSE: 63 mg/dL — AB (ref 65–99)
Globulin, Total: 2.9 g/dL (ref 1.5–4.5)
Potassium: 3.8 mmol/L (ref 3.5–5.2)
Sodium: 139 mmol/L (ref 134–144)
Total Protein: 7.1 g/dL (ref 6.0–8.5)

## 2015-10-27 LAB — CBC WITH DIFFERENTIAL/PLATELET
Basophils Absolute: 0 10*3/uL (ref 0.0–0.2)
Basos: 0 %
EOS (ABSOLUTE): 0.3 10*3/uL (ref 0.0–0.4)
EOS: 3 %
HEMATOCRIT: 35 % (ref 34.0–46.6)
Hemoglobin: 10 g/dL — ABNORMAL LOW (ref 11.1–15.9)
IMMATURE GRANS (ABS): 0.1 10*3/uL (ref 0.0–0.1)
IMMATURE GRANULOCYTES: 1 %
LYMPHS: 25 %
Lymphocytes Absolute: 2.7 10*3/uL (ref 0.7–3.1)
MCH: 18.9 pg — ABNORMAL LOW (ref 26.6–33.0)
MCHC: 28.6 g/dL — ABNORMAL LOW (ref 31.5–35.7)
MCV: 66 fL — ABNORMAL LOW (ref 79–97)
MONOCYTES: 9 %
MONOS ABS: 1 10*3/uL — AB (ref 0.1–0.9)
Neutrophils Absolute: 6.8 10*3/uL (ref 1.4–7.0)
Neutrophils: 62 %
Platelets: 390 10*3/uL — ABNORMAL HIGH (ref 150–379)
RBC: 5.3 x10E6/uL — AB (ref 3.77–5.28)
RDW: 18.8 % — AB (ref 12.3–15.4)
WBC: 10.8 10*3/uL (ref 3.4–10.8)

## 2015-10-27 LAB — HDL CHOLESTEROL: HDL: 83 mg/dL (ref >39)

## 2015-10-27 LAB — HEPATITIS C ANTIBODY: Hep C Virus Ab: <0.1 s/co ratio (ref 0.0–0.9)

## 2015-10-27 LAB — RPR: RPR: NONREACTIVE

## 2015-10-27 LAB — CHOLESTEROL, TOTAL: CHOLESTEROL TOTAL: 178 mg/dL (ref 100–199)

## 2015-10-27 LAB — TRIGLYCERIDES: TRIGLYCERIDES: 67 mg/dL (ref 0–149)

## 2015-10-27 LAB — HEPATITIS B SURFACE ANTIGEN: Hepatitis B Surface Ag: NEGATIVE

## 2015-10-27 LAB — TSH: TSH: 1.51 u[IU]/mL (ref 0.450–4.500)

## 2015-10-27 LAB — HIV ANTIBODY (ROUTINE TESTING W REFLEX): HIV Screen 4th Generation wRfx: NONREACTIVE

## 2015-10-27 NOTE — Progress Notes (Signed)
Subjective:        Danielle Holden is a 45 y.o. female here for a routine exam.  Current complaints: None.    Personal health questionnaire:  Is patient [name], have a family history of breast and/or ovarian cancer: no Is there a family history of uterine cancer diagnosed at age < 51, gastrointestinal cancer, urinary tract cancer, family member who is a Personnel officer syndrome-associated carrier: no Is the patient overweight and hypertensive, family history of diabetes, personal history of gestational diabetes, preeclampsia or PCOS: no Is patient over 57, have PCOS,  family history of premature CHD under age 41, diabetes, smoke, have hypertension or peripheral artery disease:  no At any time, has a partner hit, kicked or otherwise hurt or frightened you?: no Over the past 2 weeks, have you felt down, depressed or hopeless?: no Over the past 2 weeks, have you felt little interest or pleasure in doing things?:no   Gynecologic History No LMP recorded. Contraception: tubal ligation Last Pap: 2016. Results were: normal Last mammogram: 2017. Results were: normal  Obstetric History OB History  Gravida Para Term Preterm AB SAB TAB Ectopic Multiple Living  4 3 3  1 1    3     # Outcome Date GA Lbr Len/2nd Weight Sex Delivery Anes PTL Lv  4 SAB 2009 [redacted]w[redacted]d         3 Term 1993 [redacted]w[redacted]d   [redacted]w[redacted]d  2 Term 08/1990 [redacted]w[redacted]d   F Vag-Spont   Y  1 Term 1989    M CS-LTranv   Y      Past Medical History  Diagnosis Date  . Anxiety   . Arthritis   . Anemia   . Lupus (HCC)   . Hypoglycemia   . Migraine   . Asthma     Past Surgical History  Procedure Laterality Date  . Cesarean section    . Appendectomy    . Tubal ligation    . Iud removal [name] 11/12/2014     Current outpatient prescriptions:  .  albuterol (PROAIR HFA) 108 (90 BASE) MCG/ACT inhaler, Inhale 2 puffs into the lungs every 4 (four) hours as needed for wheezing or shortness of breath., Disp: 1 Inhaler, Rfl: 1 .   beclomethasone (QVAR) 80 MCG/ACT inhaler, Inhale 2 puffs into the lungs 2 (two) times daily., Disp: 1 Inhaler, Rfl: 3 .  fluticasone (FLONASE) 50 MCG/ACT nasal spray, , Disp: , Rfl:  .  Ibuprofen 200 MG CAPS, Take 600 mg by mouth as needed., Disp: , Rfl:  .  Multiple Vitamin (MULTI VITAMIN DAILY PO), Take by mouth., Disp: , Rfl:  .  topiramate (TOPAMAX) 25 MG tablet, , Disp: , Rfl:  .  vitamin B-12 (CYANOCOBALAMIN) 100 MCG tablet, Take 100 mcg by mouth daily., Disp: , Rfl:  Allergies  Allergen Reactions  . Anesthetics, Amide Anaphylaxis and Other (See Comments)    Heart stopped and stopped breathing  . Trazodone Anaphylaxis    Tongue swells.  . Trazodone And Nefazodone Anaphylaxis    Tongue swells.  . Benadryl [Diphenhydramine]   . Effexor [Venlafaxine] Hives  . Nortriptyline Hives    Social History  Substance Use Topics  . Smoking status: Never Smoker   . Smokeless tobacco: Never Used  . Alcohol Use: No    Family History  Problem Relation Age of Onset  . Cancer Maternal Grandmother   . Cancer Paternal Grandmother   . Suicidality Father  Review of Systems  Constitutional: negative for fatigue and weight loss Respiratory: negative for cough and wheezing Cardiovascular: negative for chest pain, fatigue and palpitations Gastrointestinal: negative for abdominal pain and change in bowel habits Musculoskeletal:negative for myalgias Neurological: negative for gait problems and tremors Behavioral/Psych: negative for abusive relationship, depression Endocrine: negative for temperature intolerance   Genitourinary:negative for abnormal menstrual periods, genital lesions, hot flashes, sexual problems and vaginal discharge Integument/breast: negative for breast lump, breast tenderness, nipple discharge and skin lesion(s)    Objective:       BP 112/70 mmHg  Pulse 69  Temp(Src) 98.9 F (37.2 C)  Wt 151 lb (68.493 kg) General:   alert  Skin:   no rash or abnormalities   Lungs:   clear to auscultation bilaterally  Heart:   regular rate and rhythm, S1, S2 normal, no murmur, click, rub or gallop  Breasts:   normal without suspicious masses, skin or nipple changes or axillary nodes  Abdomen:  normal findings: no organomegaly, soft, non-tender and no hernia  Pelvis:  External genitalia: normal general appearance Urinary system: urethral meatus normal and bladder without fullness, nontender Vaginal: normal without tenderness, induration or masses Cervix: normal appearance Adnexa: normal bimanual exam Uterus: anteverted and non-tender, normal size   Lab Review Urine pregnancy test Labs reviewed yes Radiologic studies reviewed yes   Assessment:    Healthy female exam.    Plan:    Education reviewed: calcium supplements, depression evaluation and low fat, low cholesterol diet. Follow up in: 1 year.   Meds ordered this encounter  Medications  . fluticasone (FLONASE) 50 MCG/ACT nasal spray    Sig:   . topiramate (TOPAMAX) 25 MG tablet    Sig:    Orders Placed This Encounter  Procedures  . CBC with Differential/Platelet  . TSH  . Comprehensive metabolic panel  . Cholesterol, total  . Triglycerides  . HDL cholesterol  . HIV antibody  . Hepatitis B surface antigen  . RPR  . Hepatitis C antibody  . POCT urinalysis dipstick

## 2015-10-29 LAB — PAP IG AND HPV HIGH-RISK
HPV, high-risk: NEGATIVE
PAP Smear Comment: 0

## 2015-10-31 LAB — NUSWAB VG+, CANDIDA 6SP
CANDIDA GLABRATA, NAA: NEGATIVE
CANDIDA KRUSEI, NAA: NEGATIVE
CANDIDA LUSITANIAE, NAA: NEGATIVE
CANDIDA TROPICALIS, NAA: NEGATIVE
Candida albicans, NAA: NEGATIVE
Candida parapsilosis, NAA: NEGATIVE
Chlamydia trachomatis, NAA: NEGATIVE
Neisseria gonorrhoeae, NAA: NEGATIVE
Trich vag by NAA: NEGATIVE

## 2015-11-08 ENCOUNTER — Telehealth (HOSPITAL_COMMUNITY): Payer: Self-pay | Admitting: *Deleted

## 2015-11-24 NOTE — Telephone Encounter (Signed)
See note for this encounter. 

## 2016-03-05 DIAGNOSIS — R768 Other specified abnormal immunological findings in serum: Secondary | ICD-10-CM | POA: Insufficient documentation

## 2016-03-05 DIAGNOSIS — I7300 Raynaud's syndrome without gangrene: Secondary | ICD-10-CM | POA: Insufficient documentation

## 2016-03-05 DIAGNOSIS — M797 Fibromyalgia: Secondary | ICD-10-CM | POA: Insufficient documentation

## 2016-08-09 ENCOUNTER — Emergency Department (HOSPITAL_COMMUNITY)
Admission: EM | Admit: 2016-08-09 | Discharge: 2016-08-10 | Disposition: A | Discharge status: Home / Self Care | Payer: Medicaid Other | Attending: Emergency Medicine | Admitting: Emergency Medicine

## 2016-08-09 ENCOUNTER — Emergency Department (HOSPITAL_COMMUNITY): Payer: Medicaid Other

## 2016-08-09 ENCOUNTER — Encounter (HOSPITAL_COMMUNITY): Payer: Self-pay | Admitting: Emergency Medicine

## 2016-08-09 DIAGNOSIS — K8050 Calculus of bile duct without cholangitis or cholecystitis without obstruction: Secondary | ICD-10-CM

## 2016-08-09 DIAGNOSIS — J45909 Unspecified asthma, uncomplicated: Secondary | ICD-10-CM | POA: Diagnosis not present

## 2016-08-09 DIAGNOSIS — R1011 Right upper quadrant pain: Secondary | ICD-10-CM

## 2016-08-09 DIAGNOSIS — K8070 Calculus of gallbladder and bile duct without cholecystitis without obstruction: Secondary | ICD-10-CM | POA: Insufficient documentation

## 2016-08-09 DIAGNOSIS — Z8673 Personal history of transient ischemic attack (TIA), and cerebral infarction without residual deficits: Secondary | ICD-10-CM | POA: Insufficient documentation

## 2016-08-09 DIAGNOSIS — K8020 Calculus of gallbladder without cholecystitis without obstruction: Secondary | ICD-10-CM

## 2016-08-09 LAB — CBC
HCT: 34 % — ABNORMAL LOW (ref 36.0–46.0)
HEMOGLOBIN: 10 g/dL — AB (ref 12.0–15.0)
MCH: 19.8 pg — ABNORMAL LOW (ref 26.0–34.0)
MCHC: 29.4 g/dL — ABNORMAL LOW (ref 30.0–36.0)
MCV: 67.2 fL — AB (ref 78.0–100.0)
Platelets: 352 10*3/uL (ref 150–400)
RBC: 5.06 MIL/uL (ref 3.87–5.11)
RDW: 20.5 % — ABNORMAL HIGH (ref 11.5–15.5)
WBC: 8.1 10*3/uL (ref 4.0–10.5)

## 2016-08-09 LAB — URINALYSIS, ROUTINE W REFLEX MICROSCOPIC
BACTERIA UA: NONE SEEN
Bilirubin Urine: NEGATIVE
GLUCOSE, UA: NEGATIVE mg/dL
KETONES UR: NEGATIVE mg/dL
Leukocytes, UA: NEGATIVE
Nitrite: NEGATIVE
PROTEIN: 30 mg/dL — AB
Specific Gravity, Urine: 1.03 (ref 1.005–1.030)
pH: 5 (ref 5.0–8.0)

## 2016-08-09 LAB — COMPREHENSIVE METABOLIC PANEL
ALBUMIN: 4.1 g/dL (ref 3.5–5.0)
ALT: 14 U/L (ref 14–54)
ANION GAP: 7 (ref 5–15)
AST: 17 U/L (ref 15–41)
Alkaline Phosphatase: 77 U/L (ref 38–126)
BUN: 14 mg/dL (ref 6–20)
CHLORIDE: 107 mmol/L (ref 101–111)
CO2: 22 mmol/L (ref 22–32)
Calcium: 8.5 mg/dL — ABNORMAL LOW (ref 8.9–10.3)
Creatinine, Ser: 0.71 mg/dL (ref 0.44–1.00)
GFR calc non Af Amer: >60 mL/min (ref >60)
Glucose, Bld: 86 mg/dL (ref 65–99)
Potassium: 3.5 mmol/L (ref 3.5–5.1)
SODIUM: 136 mmol/L (ref 135–145)
Total Bilirubin: 0.3 mg/dL (ref 0.3–1.2)
Total Protein: 7.6 g/dL (ref 6.5–8.1)

## 2016-08-09 LAB — LIPASE, BLOOD: LIPASE: 29 U/L (ref 11–51)

## 2016-08-09 LAB — I-STAT BETA HCG BLOOD, ED (MC, WL, AP ONLY): I-stat hCG, quantitative: <5 m[IU]/mL (ref <5)

## 2016-08-09 LAB — PREGNANCY, URINE: PREG TEST UR: NEGATIVE

## 2016-08-09 MED ORDER — MORPHINE SULFATE (PF) 4 MG/ML IV SOLN
4.0000 mg | Freq: Once | INTRAVENOUS | Status: AC
  Administered 2016-08-09: 4 mg via INTRAVENOUS
  Filled 2016-08-09: qty 1

## 2016-08-09 MED ORDER — ONDANSETRON HCL 4 MG/2ML IJ SOLN
4.0000 mg | Freq: Once | INTRAMUSCULAR | Status: AC
  Administered 2016-08-09: 4 mg via INTRAVENOUS
  Filled 2016-08-09: qty 2

## 2016-08-09 NOTE — ED Triage Notes (Signed)
Pt c/o progressive RUQ abdominal pain/pressure worse with coughing, sneezing, or eating x 1 month, lost 11 pounds in 2 weeks. Nausea x 2 weeks, diarrhea x 2 days.  Pt went to urgent care 1 month ago for same, UC said might by due to gallbladder but pt never got it evaluated further.

## 2016-08-09 NOTE — ED Notes (Signed)
Pt. Is complaining of tongue swelling. Upon assessment, pt's tongue is purple in some spots. This started about 10 mins ago. Pt.'s reports not being allergic to Morphine or Zofran, the medications which were given at 21:19. No problems breathing or talking, but says throat has gotten tighter.

## 2016-08-10 MED ORDER — ONDANSETRON HCL 4 MG PO TABS: 4.0000 mg | ORAL_TABLET | Freq: Three times a day (TID) | ORAL | 0 refills | Status: DC | PRN

## 2016-08-10 MED ORDER — HYDROCODONE-ACETAMINOPHEN 5-325 MG PO TABS: 1.0000 | ORAL_TABLET | Freq: Four times a day (QID) | ORAL | 0 refills | Status: DC | PRN

## 2016-08-10 NOTE — Discharge Instructions (Signed)
You have gallstones in your gallbladder that is causing you pain after you eat. You have been given surgery follow-up. Please call for appointment to discuss potential elective surgery to remove your gallbladder.  Return without fail for worsening symptoms, including fever, escalating pain, intractable vomiting or any other symptoms concerning to you.

## 2016-08-10 NOTE — ED Provider Notes (Signed)
[facility]-EMERGENCY DEPT Provider Note   CSN: [id number] Arrival date & time: 08/09/16  1828     History   Chief Complaint Chief Complaint  Patient presents with  . Abdominal Pain    HPI Danielle Holden is a 46 y.o. female.  The history is provided by the patient.  Abdominal Pain   This is a recurrent problem. The current episode started 6 to 12 hours ago. The problem occurs daily. The problem has not changed since onset.The pain is associated with eating. The pain is located in the RUQ. The pain is moderate. Associated symptoms include diarrhea and nausea. Pertinent negatives include fever, hematochezia, melena, vomiting, constipation, dysuria, frequency and hematuria. The symptoms are aggravated by eating. Nothing relieves the symptoms.   46 year old female who presents with abdominal pain. She has a history of cesarean section, appendectomy, and lupus. States that for several weeks now she has been having intermittent right upper quadrant abdominal pain. Did see somebody at urgent care for this a month ago and told it may be her gallbladder, but she never got it evaluated further. States that the pain typically comes on after eating, and can last for a few hours or day. Has had nausea with this and multiple episodes of diarrhea that is nonbloody and non-melanotic. Has not had fevers or chills, dysuria, urinary frequency. She is not tried any medications for her symptoms. No other alleviating or aggravating factors  Past Medical History:  Diagnosis Date  . Anemia   . Anxiety   . Arthritis   . Asthma   . Hypoglycemia   . Lupus   . Migraine     Patient Active Problem List   Diagnosis Date Noted  . Generalized anxiety disorder 07/26/2014  . Migraine without aura and without status migrainosus, not intractable 07/26/2014  . Hx of migraines 01/27/2014  . Migraine, unspecified, without mention of intractable migraine without mention of status migrainosus 09/28/2013  . Mastodynia,  female 07/13/2013  . Abnormal uterine bleeding 07/13/2013  . Iron deficiency anemia 06/30/2013  . TIA (transient ischemic attack) 06/27/2013  . Acute right-sided weakness 06/27/2013  . Anemia 06/27/2013  . Menorrhagia 06/27/2013  . Chest pain 06/27/2013    Past Surgical History:  Procedure Laterality Date  . APPENDECTOMY    . CESAREAN SECTION    . IUD REMOVAL [name] 11/12/2014  . TUBAL LIGATION      OB History    Gravida Para Term Preterm AB Living   4 3 3   1 3    SAB TAB Ectopic Multiple Live Births   1       3       Home Medications    Prior to Admission medications   Medication Sig Start Date End Date Taking? Authorizing Provider  albuterol (PROAIR HFA) 108 (90 BASE) MCG/ACT inhaler Inhale 2 puffs into the lungs every 4 (four) hours as needed for wheezing or shortness of breath. 02/25/15  Yes Roselyn 02/27/15, MD  fluticasone (FLONASE) 50 MCG/ACT nasal spray Place 1 spray into both nostrils daily.   Yes Historical Provider, MD  Ibuprofen 200 MG CAPS Take 600 mg by mouth as needed (pain).    Yes Historical Provider, MD  naproxen sodium (ANAPROX) 220 MG tablet Take 1 tablet by mouth 2 (two) times daily.   Yes Historical Provider, MD  beclomethasone (QVAR) 80 MCG/ACT inhaler Inhale 2 puffs into the lungs 2 (two) times daily. Patient not taking: Reported on 08/09/2016 02/25/15   02/27/15, MD  HYDROcodone-acetaminophen (NORCO/VICODIN) 5-325 MG tablet Take 1 tablet by mouth every 6 (six) hours as needed for moderate pain or severe pain. 08/10/16   Lavera Guise, MD  ondansetron (ZOFRAN) 4 MG tablet Take 1 tablet (4 mg total) by mouth every 8 (eight) hours as needed for nausea or vomiting. 08/10/16   Lavera Guise, MD    Family History Family History  Problem Relation Age of Onset  . Suicidality Father   . Cancer Maternal Grandmother   . Cancer Paternal Grandmother     Social History Social History  Substance Use Topics  . Smoking status: Never Smoker  . Smokeless  tobacco: Never Used  . Alcohol use No     Allergies   Anesthetics, amide; Trazodone; Trazodone and nefazodone; Benadryl [diphenhydramine]; Effexor [venlafaxine]; and Nortriptyline   Review of Systems Review of Systems  Constitutional: Negative for fever.  HENT: Negative for congestion.   Respiratory: Negative for cough.   Cardiovascular: Negative for chest pain.  Gastrointestinal: Positive for abdominal pain, diarrhea and nausea. Negative for constipation, hematochezia, melena and vomiting.  Genitourinary: Negative for dysuria, frequency and hematuria.  Allergic/Immunologic: Negative for immunocompromised state.  Neurological: Negative for syncope.  Hematological: Does not bruise/bleed easily.  Psychiatric/Behavioral: Negative for confusion.  All other systems reviewed and are negative.    Physical Exam Updated Vital Signs BP (!) 107/55 (BP Location: Left Arm)   Pulse 77   Temp 97.9 F (36.6 C) (Oral)   Resp 18   Ht 5\' 3"  (1.6 m)   Wt 148 lb 12.8 oz (67.5 kg)   SpO2 99%   BMI 26.36 kg/m   Physical Exam Physical Exam  Nursing note and vitals reviewed. Constitutional: Well developed, well nourished, non-toxic, and in no acute distress Head: Normocephalic and atraumatic.  Mouth/Throat: Oropharynx is clear and moist.  Neck: Normal range of motion. Neck supple.  Cardiovascular: Normal rate and regular rhythm.   Pulmonary/Chest: Effort normal and breath sounds normal.  Abdominal: Soft. There is RUQ tenderness. There is no rebound and no guarding. no CVA tenderness Musculoskeletal: Normal range of motion.  Neurological: Alert, no facial droop, fluent speech, moves all extremities symmetrically Skin: Skin is warm and dry.  Psychiatric: Cooperative   ED Treatments / Results  Labs (all labs ordered are listed, but only abnormal results are displayed) Labs Reviewed  COMPREHENSIVE METABOLIC PANEL - Abnormal; Notable for the following:       Result Value   Calcium 8.5  (*)    All other components within normal limits  CBC - Abnormal; Notable for the following:    Hemoglobin 10.0 (*)    HCT 34.0 (*)    MCV 67.2 (*)    MCH 19.8 (*)    MCHC 29.4 (*)    RDW 20.5 (*)    All other components within normal limits  URINALYSIS, ROUTINE W REFLEX MICROSCOPIC - Abnormal; Notable for the following:    APPearance HAZY (*)    Hgb urine dipstick LARGE (*)    Protein, ur 30 (*)    Squamous Epithelial / LPF 0-5 (*)    All other components within normal limits  LIPASE, BLOOD  PREGNANCY, URINE  I-STAT BETA HCG BLOOD, ED ([facility], [facility], [facility] ONLY)    EKG  EKG Interpretation None       Radiology US Abdomen Limited Ruq  Result Date: 08/09/2016 CLINICAL DATA:  Right upper quadrant abdominal pain. EXAM: US ABDOMEN LIMITED - RIGHT UPPER QUADRANT COMPARISON:  Abdominal ultrasound 04/30/2014 FINDINGS: Gallbladder:  Gallbladder physiologically distended. Intraluminal sludge and small stones. No wall thickening or pericholecystic fluid. No sonographic Murphy sign noted by sonographer. Common bile duct: Diameter: 4.5 mm. Liver: No focal lesion identified. Within normal limits in parenchymal echogenicity. Normal directional flow in the main portal vein. Left lobe is suboptimally assessed secondary to bowel gas. IMPRESSION: Minimal sludge and small stones in the gallbladder. No sonographic findings of acute cholecystitis. No biliary dilatation. Electronically Signed   By: Rubye Oaks M.D.   On: 08/09/2016 22:57    Procedures Procedures (including critical care time)  Medications Ordered in ED Medications  morphine 4 MG/ML injection 4 mg (4 mg Intravenous Given 08/09/16 2119)  ondansetron (ZOFRAN) injection 4 mg (4 mg Intravenous Given 08/09/16 2119)     Initial Impression / Assessment and Plan / ED Course  I have reviewed the triage vital signs and the nursing notes.  Pertinent labs & imaging results that were available during my care of the patient were reviewed by me and  considered in my medical decision making (see chart for details).     Patient is well-appearing and in no acute distress. Afebrile with stable vital signs. Right upper quadrant tenderness on abdominal exam, but has a nonsurgical abdomen. Normal lipase and hepatic function panel. His baseline anemia. Right upper quadrant ultrasound visualized and shows evidence of gallbladder sludge and small stones. Symptoms seemed consistent with that of biliary colic. Her symptoms are improved after analgesics. She is able to tolerate crackers and juice without difficulty. She is felt to be appropriate for outpatient referral to surgery for potential elective surgical management.  Of note, during ED course, she reports she has noted mild tongue swelling with black spots. No difficulty breathing or swallowing. This has been ongoing and constant for 3-4 days. No new foods or medications. Not worsened by medications received today. No ACE inhibitor medications. On exam, oropharynx clear. There are some darkened spots over tongue. Minimal swelling noted, does not seem c/w angioedema or allergic reaction. Has been stable for 3 days and protecting airway. She will see PCP tomorrow for follow-up.   Strict return and follow-up instructions reviewed. She expressed understanding of all discharge instructions and felt comfortable with the plan of care.  The patient appears reasonably screened and/or stabilized for discharge and I doubt any other medical condition or other [facility] requiring further screening, evaluation, or treatment in the ED at this time prior to discharge.   Final Clinical Impressions(s) / ED Diagnoses   Final diagnoses:  RUQ abdominal pain  Gallstones  Biliary colic    New Prescriptions New Prescriptions   HYDROCODONE-ACETAMINOPHEN (NORCO/VICODIN) 5-325 MG TABLET    Take 1 tablet by mouth every 6 (six) hours as needed for moderate pain or severe pain.   ONDANSETRON (ZOFRAN) 4 MG TABLET    Take 1 tablet  (4 mg total) by mouth every 8 (eight) hours as needed for nausea or vomiting.     Lavera Guise, MD 08/10/16 [phone]

## 2016-10-25 DIAGNOSIS — IMO0002 Reserved for concepts with insufficient information to code with codable children: Secondary | ICD-10-CM | POA: Insufficient documentation

## 2016-10-25 DIAGNOSIS — K529 Noninfective gastroenteritis and colitis, unspecified: Secondary | ICD-10-CM | POA: Insufficient documentation

## 2016-10-25 DIAGNOSIS — N926 Irregular menstruation, unspecified: Secondary | ICD-10-CM | POA: Insufficient documentation

## 2016-10-25 DIAGNOSIS — J302 Other seasonal allergic rhinitis: Secondary | ICD-10-CM | POA: Insufficient documentation

## 2016-10-25 DIAGNOSIS — K439 Ventral hernia without obstruction or gangrene: Secondary | ICD-10-CM | POA: Insufficient documentation

## 2016-10-25 DIAGNOSIS — R42 Dizziness and giddiness: Secondary | ICD-10-CM | POA: Insufficient documentation

## 2016-10-25 DIAGNOSIS — Z8739 Personal history of other diseases of the musculoskeletal system and connective tissue: Secondary | ICD-10-CM | POA: Insufficient documentation

## 2016-10-29 ENCOUNTER — Ambulatory Visit: Payer: Self-pay | Admitting: Obstetrics

## 2017-05-30 ENCOUNTER — Other Ambulatory Visit (HOSPITAL_COMMUNITY)
Admission: RE | Admit: 2017-05-30 | Discharge: 2017-05-30 | Disposition: A | Discharge status: Home / Self Care | Payer: Medicaid Other | Source: Ambulatory Visit | Attending: Obstetrics | Admitting: Obstetrics

## 2017-05-30 ENCOUNTER — Encounter: Payer: Self-pay | Admitting: Obstetrics

## 2017-05-30 ENCOUNTER — Ambulatory Visit: Payer: Medicaid Other | Admitting: Obstetrics

## 2017-05-30 VITALS — BP 110/70 | HR 76 | Ht 63.0 in | Wt 151.6 lb

## 2017-05-30 DIAGNOSIS — M329 Systemic lupus erythematosus, unspecified: Secondary | ICD-10-CM

## 2017-05-30 DIAGNOSIS — N938 Other specified abnormal uterine and vaginal bleeding: Secondary | ICD-10-CM | POA: Insufficient documentation

## 2017-05-30 DIAGNOSIS — K429 Umbilical hernia without obstruction or gangrene: Secondary | ICD-10-CM | POA: Diagnosis not present

## 2017-05-30 DIAGNOSIS — F419 Anxiety disorder, unspecified: Secondary | ICD-10-CM | POA: Insufficient documentation

## 2017-05-30 DIAGNOSIS — Z0000 Encounter for general adult medical examination without abnormal findings: Secondary | ICD-10-CM | POA: Diagnosis not present

## 2017-05-30 DIAGNOSIS — D509 Iron deficiency anemia, unspecified: Secondary | ICD-10-CM

## 2017-05-30 DIAGNOSIS — N898 Other specified noninflammatory disorders of vagina: Secondary | ICD-10-CM | POA: Insufficient documentation

## 2017-05-30 DIAGNOSIS — Z79899 Other long term (current) drug therapy: Secondary | ICD-10-CM | POA: Diagnosis not present

## 2017-05-30 DIAGNOSIS — N644 Mastodynia: Secondary | ICD-10-CM | POA: Diagnosis not present

## 2017-05-30 DIAGNOSIS — N946 Dysmenorrhea, unspecified: Secondary | ICD-10-CM | POA: Insufficient documentation

## 2017-05-30 DIAGNOSIS — M1990 Unspecified osteoarthritis, unspecified site: Secondary | ICD-10-CM | POA: Insufficient documentation

## 2017-05-30 DIAGNOSIS — Z01419 Encounter for gynecological examination (general) (routine) without abnormal findings: Secondary | ICD-10-CM | POA: Insufficient documentation

## 2017-05-30 DIAGNOSIS — K580 Irritable bowel syndrome with diarrhea: Secondary | ICD-10-CM | POA: Diagnosis not present

## 2017-05-30 DIAGNOSIS — J45909 Unspecified asthma, uncomplicated: Secondary | ICD-10-CM | POA: Diagnosis not present

## 2017-05-30 DIAGNOSIS — N939 Abnormal uterine and vaginal bleeding, unspecified: Secondary | ICD-10-CM

## 2017-05-30 DIAGNOSIS — N944 Primary dysmenorrhea: Secondary | ICD-10-CM

## 2017-05-30 MED ORDER — NAPROXEN 500 MG PO TBEC: 500.0000 mg | DELAYED_RELEASE_TABLET | Freq: Two times a day (BID) | ORAL | 5 refills | Status: DC

## 2017-05-30 MED ORDER — FERROUS SULFATE 325 (65 FE) MG PO TABS: 325.0000 mg | ORAL_TABLET | Freq: Every day | ORAL | 5 refills | Status: DC

## 2017-05-30 MED ORDER — OXYCODONE-ACETAMINOPHEN 10-325 MG PO TABS: 1.0000 | ORAL_TABLET | ORAL | 0 refills | Status: DC | PRN

## 2017-05-30 NOTE — Progress Notes (Signed)
Pt presents for annual and pap. Needs f/u for breast Cyst found on recent MGM in [place]. Records currently unavailable - pt will sign ROI during checkout.

## 2017-05-30 NOTE — Progress Notes (Addendum)
Subjective:        Danielle Holden is a 47 y.o. female here for a routine exam.  Current complaints: Painful periods that are sometimes twice a month.  Also has a painful area in left breast - no mass palpated.  Had a recent Diagnostic Mammogram in [place] just before coming to [facility] and was told that she needed further work-up.    Personal health questionnaire:  Is patient [name], have a family history of breast and/or ovarian cancer: yes.  MGM. Is there a family history of uterine cancer diagnosed at age < 65, gastrointestinal cancer, urinary tract cancer, family member who is a Personnel officer syndrome-associated carrier: yes Is the patient overweight and hypertensive, family history of diabetes, personal history of gestational diabetes, preeclampsia or PCOS: no Is patient over 76, have PCOS,  family history of premature CHD under age 42, diabetes, smoke, have hypertension or peripheral artery disease:  no At any time, has a partner hit, kicked or otherwise hurt or frightened you?: no Over the past 2 weeks, have you felt down, depressed or hopeless?: no Over the past 2 weeks, have you felt little interest or pleasure in doing things?:no   Gynecologic History Patient's last menstrual period was 05/05/2017. Contraception: tubal ligation Last Pap: 2017. Results were: normal Last mammogram: 2018. Results were: abnormal  Obstetric History OB History  Gravida Para Term Preterm AB Living  4 3 3   1 3   SAB TAB Ectopic Multiple Live Births  1       3    # Outcome Date GA Lbr Len/2nd Weight Sex Delivery Anes PTL Lv  4 SAB 2009 [redacted]w[redacted]d         3 Term 1993 [redacted]w[redacted]d   M Vag-Spont   LIV  2 Term 08/1990 [redacted]w[redacted]d   F Vag-Spont   LIV  1 Term 73    M CS-LTranv   LIV      Past Medical History:  Diagnosis Date  . Anemia   . Anxiety   . Arthritis   . Asthma   . Hypoglycemia   . Lupus   . Migraine     Past Surgical History:  Procedure Laterality Date  . APPENDECTOMY    . CESAREAN  SECTION    . IUD REMOVAL [name] 11/12/2014  . TUBAL LIGATION       Current Outpatient Medications:  .  ferrous sulfate 325 (65 FE) MG tablet, Take by mouth., Disp: , Rfl:  .  Ibuprofen 200 MG CAPS, Take 600 mg by mouth as needed (pain). , Disp: , Rfl:  .  albuterol (PROAIR HFA) 108 (90 BASE) MCG/ACT inhaler, Inhale 2 puffs into the lungs every 4 (four) hours as needed for wheezing or shortness of breath. (Patient not taking: Reported on 05/30/2017), Disp: 1 Inhaler, Rfl: 1 .  beclomethasone (QVAR) 80 MCG/ACT inhaler, Inhale 2 puffs into the lungs 2 (two) times daily. (Patient not taking: Reported on 08/09/2016), Disp: 1 Inhaler, Rfl: 3 .  ferrous sulfate 325 (65 FE) MG tablet, Take 1 tablet (325 mg total) by mouth daily with breakfast., Disp: 60 tablet, Rfl: 5 .  fluticasone (FLONASE) 50 MCG/ACT nasal spray, Place 1 spray into both nostrils daily., Disp: , Rfl:  .  HYDROcodone-acetaminophen (NORCO/VICODIN) 5-325 MG tablet, Take 1 tablet by mouth every 6 (six) hours as needed for moderate pain or severe pain. (Patient not taking: Reported on 05/30/2017), Disp: 10 tablet, Rfl: 0 .  naproxen (EC-NAPROSYN) 500 MG EC tablet, Take  1 tablet (500 mg total) by mouth 2 (two) times daily with a meal., Disp: 40 tablet, Rfl: 5 .  naproxen sodium (ANAPROX) 220 MG tablet, Take 1 tablet by mouth 2 (two) times daily., Disp: , Rfl:  .  ondansetron (ZOFRAN) 4 MG tablet, Take 1 tablet (4 mg total) by mouth every 8 (eight) hours as needed for nausea or vomiting. (Patient not taking: Reported on 05/30/2017), Disp: 12 tablet, Rfl: 0 .  oxyCODONE-acetaminophen (PERCOCET) 10-325 MG tablet, Take 1 tablet by mouth every 4 (four) hours as needed for pain., Disp: 30 tablet, Rfl: 0 Allergies  Allergen Reactions  . Anesthetics, Amide Anaphylaxis and Other (See Comments)    Heart stopped and stopped breathing  . Trazodone Anaphylaxis    Tongue swells.  . Trazodone And Nefazodone Anaphylaxis    Tongue swells.  . Benadryl  [Diphenhydramine]   . Effexor [Venlafaxine] Hives  . Nortriptyline Hives    Social History   Tobacco Use  . Smoking status: Never Smoker  . Smokeless tobacco: Never Used  Substance Use Topics  . Alcohol use: No    Alcohol/week: 0.0 oz    Family History  Problem Relation Age of Onset  . Suicidality Father   . Cancer Maternal Grandmother   . Cancer Paternal Grandmother       Review of Systems  Constitutional: negative for fatigue and weight loss Respiratory: negative for cough and wheezing Cardiovascular: negative for chest pain, fatigue and palpitations Gastrointestinal: negative for abdominal pain and change in bowel habits Musculoskeletal:negative for myalgias Neurological: negative for gait problems and tremors Behavioral/Psych: negative for abusive relationship, depression Endocrine: negative for temperature intolerance    Genitourinary:positive for abnormal menstrual periods; and negative for genital lesions, hot flashes, sexual problems and vaginal discharge Integument/breast: positive for breast tenderness - left    Objective:       BP 110/70   Pulse 76   Ht 5\' 3"  (1.6 m)   Wt 151 lb 9.6 oz (68.8 kg)   LMP 05/05/2017   BMI 26.85 kg/m  General:   alert  Skin:   no rash or abnormalities  Lungs:   clear to auscultation bilaterally  Heart:   regular rate and rhythm, S1, S2 normal, no murmur, click, rub or gallop  Breasts:   tenderness left breast at 9 o'clock, outer quadrant.  No masses  Abdomen:  normal findings: no organomegaly, soft, non-tender and no hernia  Pelvis:  External genitalia: normal general appearance Urinary system: urethral meatus normal and bladder without fullness, nontender Vaginal: normal without tenderness, induration or masses Cervix: normal appearance Adnexa: normal bimanual exam Uterus: anteverted and non-tender, normal size   Lab Review Urine pregnancy test Labs reviewed yes Radiologic studies reviewed yes  50% of 20 min visit  spent on counseling and coordination of care.   Assessment and Plan:     1. Encounter for routine gynecological examination with Papanicolaou smear of cervix Rx: - Cytology - PAP - TSH - Comprehensive metabolic panel - Hemoglobin A1c  2. Abnormal uterine bleeding (AUB) Rx: - US PELVIC COMPLETE WITH TRANSVAGINAL; Future  3. Vaginal discharge Rx; - Cervicovaginal ancillary only  4. Mastodynia of left breast Rx: - MM DIAG BREAST TOMO BILATERAL; Future - US BREAST LTD UNI LEFT INC AXILLA; Future  5. Umbilical hernia without obstruction and without gangrene Rx: - Ambulatory referral to General Surgery  6. Iron deficiency anemia, unspecified iron deficiency anemia type Rx: - ferrous sulfate 325 (65 FE) MG tablet; Take 1 tablet (325  mg total) by mouth daily with breakfast.  Dispense: 60 tablet; Refill: 5 - CBC  7. Systemic lupus erythematosus, unspecified SLE type, unspecified organ involvement status (HCC) - referred to PCP for management  8. Primary dysmenorrhea Rx: - naproxen (EC-NAPROSYN) 500 MG EC tablet; Take 1 tablet (500 mg total) by mouth 2 (two) times daily with a meal.  Dispense: 40 tablet; Refill: 5 - oxyCODONE-acetaminophen (PERCOCET) 10-325 MG tablet; Take 1 tablet by mouth every 4 (four) hours as needed for pain.  Dispense: 30 tablet; Refill: 0  9. Irritable bowel syndrome with diarrhea - referred to PCP for management  10. Routine adult health maintenance Rx: - Ambulatory referral to Internal Medicine  Plan:    Education reviewed: calcium supplements, depression evaluation, low fat, low cholesterol diet, safe sex/STD prevention, self breast exams, skin cancer screening and weight bearing exercise. Follow up in: 2 weeks.   Meds ordered this encounter  Medications  . naproxen (EC-NAPROSYN) 500 MG EC tablet    Sig: Take 1 tablet (500 mg total) by mouth 2 (two) times daily with a meal.    Dispense:  40 tablet    Refill:  5  . oxyCODONE-acetaminophen  (PERCOCET) 10-325 MG tablet    Sig: Take 1 tablet by mouth every 4 (four) hours as needed for pain.    Dispense:  30 tablet    Refill:  0  . ferrous sulfate 325 (65 FE) MG tablet    Sig: Take 1 tablet (325 mg total) by mouth daily with breakfast.    Dispense:  60 tablet    Refill:  5   Orders Placed This Encounter  Procedures  . MM DIAG BREAST TOMO BILATERAL    INS-[facility]CD PF 09/21/15 @ BCG/NO NEEDS/TOMO/BC ANEETRA @ [facility] REQ    Standing Status:   Future    Standing Expiration Date:   07/29/2018    Order Specific Question:   Reason for Exam (SYMPTOM  OR DIAGNOSIS REQUIRED)    Answer:   Mastodynia, left breast    Order Specific Question:   Is the patient pregnant?    Answer:   No    Order Specific Question:   Preferred imaging location?    Answer:   [facility]  . US BREAST LTD UNI LEFT INC AXILLA    INS-[facility] PF 09/21/15 @ BCG/NO NEEDS/TOMO/BC ANEETRA @ [facility] REQ    Standing Status:   Future    Standing Expiration Date:   07/29/2018    Order Specific Question:   Reason for Exam (SYMPTOM  OR DIAGNOSIS REQUIRED)    Answer:   Mastodynia, left breast    Order Specific Question:   Preferred imaging location?    Answer:   [facility]  . CBC  . TSH  . Comprehensive metabolic panel  . Hemoglobin A1c  . Ambulatory referral to General Surgery    Referral Priority:   Routine    Referral Type:   Surgical    Referral Reason:   Specialty Services Required    Requested Specialty:   General Surgery    Number of Visits Requested:   1  . Ambulatory referral to Internal Medicine    Referral Priority:   Routine    Referral Type:   Consultation    Referral Reason:   Specialty Services Required    Requested Specialty:   Internal Medicine    Number of Visits Requested:   1    Brock Bad MD

## 2017-05-31 LAB — CERVICOVAGINAL ANCILLARY ONLY
BACTERIAL VAGINITIS: NEGATIVE
Candida vaginitis: NEGATIVE
Chlamydia: NEGATIVE
NEISSERIA GONORRHEA: NEGATIVE
TRICH (WINDOWPATH): NEGATIVE

## 2017-05-31 LAB — CBC
HEMOGLOBIN: 8.7 g/dL — AB (ref 11.1–15.9)
Hematocrit: 28.6 % — ABNORMAL LOW (ref 34.0–46.6)
MCH: 19.3 pg — ABNORMAL LOW (ref 26.6–33.0)
MCHC: 30.4 g/dL — ABNORMAL LOW (ref 31.5–35.7)
MCV: 64 fL — ABNORMAL LOW (ref 79–97)
Platelets: 340 10*3/uL (ref 150–379)
RBC: 4.5 x10E6/uL (ref 3.77–5.28)
RDW: 17.4 % — ABNORMAL HIGH (ref 12.3–15.4)
WBC: 7.5 10*3/uL (ref 3.4–10.8)

## 2017-05-31 LAB — COMPREHENSIVE METABOLIC PANEL
ALK PHOS: 81 IU/L (ref 39–117)
ALT: 11 IU/L (ref 0–32)
AST: 18 IU/L (ref 0–40)
Albumin/Globulin Ratio: 1.3 (ref 1.2–2.2)
Albumin: 4 g/dL (ref 3.5–5.5)
BUN/Creatinine Ratio: 15 (ref 9–23)
BUN: 11 mg/dL (ref 6–24)
CHLORIDE: 105 mmol/L (ref 96–106)
CO2: 20 mmol/L (ref 20–29)
CREATININE: 0.72 mg/dL (ref 0.57–1.00)
Calcium: 8.5 mg/dL — ABNORMAL LOW (ref 8.7–10.2)
GFR calc non Af Amer: 99 mL/min/{1.73_m2} (ref >59)
GFR, EST AFRICAN AMERICAN: 115 mL/min/{1.73_m2} (ref >59)
GLUCOSE: 81 mg/dL (ref 65–99)
Globulin, Total: 3 g/dL (ref 1.5–4.5)
Potassium: 4.3 mmol/L (ref 3.5–5.2)
Sodium: 142 mmol/L (ref 134–144)
TOTAL PROTEIN: 7 g/dL (ref 6.0–8.5)

## 2017-05-31 LAB — HEMOGLOBIN A1C
Est. average glucose Bld gHb Est-mCnc: 108 mg/dL
HEMOGLOBIN A1C: 5.4 % (ref 4.8–5.6)

## 2017-05-31 LAB — TSH: TSH: 1.59 u[IU]/mL (ref 0.450–4.500)

## 2017-05-31 LAB — CYTOLOGY - PAP
DIAGNOSIS: NEGATIVE
HPV (WINDOPATH): NOT DETECTED

## 2017-05-31 NOTE — Addendum Note (Signed)
Addended by: Coral Ceo A on: 05/31/2017 12:07 AM   Modules accepted: Orders

## 2017-06-05 ENCOUNTER — Ambulatory Visit: Payer: Medicaid Other

## 2017-06-05 ENCOUNTER — Ambulatory Visit
Admission: RE | Admit: 2017-06-05 | Discharge: 2017-06-05 | Disposition: A | Discharge status: Home / Self Care | Payer: Medicaid Other | Source: Ambulatory Visit | Attending: Obstetrics | Admitting: Obstetrics

## 2017-06-05 DIAGNOSIS — N644 Mastodynia: Secondary | ICD-10-CM

## 2017-06-07 ENCOUNTER — Ambulatory Visit (HOSPITAL_COMMUNITY)
Admission: RE | Admit: 2017-06-07 | Discharge: 2017-06-07 | Disposition: A | Discharge status: Home / Self Care | Payer: Medicaid Other | Source: Ambulatory Visit | Attending: Obstetrics | Admitting: Obstetrics

## 2017-06-07 DIAGNOSIS — N939 Abnormal uterine and vaginal bleeding, unspecified: Secondary | ICD-10-CM

## 2017-06-10 ENCOUNTER — Other Ambulatory Visit: Payer: Self-pay | Admitting: Obstetrics

## 2017-06-10 DIAGNOSIS — K8020 Calculus of gallbladder without cholecystitis without obstruction: Secondary | ICD-10-CM | POA: Diagnosis not present

## 2017-06-10 DIAGNOSIS — K439 Ventral hernia without obstruction or gangrene: Secondary | ICD-10-CM | POA: Diagnosis not present

## 2017-06-13 ENCOUNTER — Encounter: Payer: Self-pay | Admitting: Obstetrics

## 2017-06-13 ENCOUNTER — Ambulatory Visit (INDEPENDENT_AMBULATORY_CARE_PROVIDER_SITE_OTHER): Payer: Medicaid Other | Admitting: Obstetrics

## 2017-06-13 VITALS — BP 127/74 | HR 78 | Wt 151.3 lb

## 2017-06-13 DIAGNOSIS — N944 Primary dysmenorrhea: Secondary | ICD-10-CM | POA: Insufficient documentation

## 2017-06-13 DIAGNOSIS — N939 Abnormal uterine and vaginal bleeding, unspecified: Secondary | ICD-10-CM | POA: Diagnosis not present

## 2017-06-13 DIAGNOSIS — D500 Iron deficiency anemia secondary to blood loss (chronic): Secondary | ICD-10-CM | POA: Diagnosis not present

## 2017-06-13 NOTE — Progress Notes (Signed)
Presents for FU visit US done 06/07/17. Having left shoulder /spine pain 7-10/10 since 8:30 am this morning. Shortness of breathe.

## 2017-06-13 NOTE — Progress Notes (Signed)
Patient ID: Danielle Holden, female   DOB: 06/27/1970, 47 y.o.   MRN: [id number]  Chief Complaint  Patient presents with  . Follow-up    HPI Danielle Holden is a 47 y.o. female.  Presents for follow up for ultrasound done for AUB and severe dysmenorrhea.  She is severely anemic, with a Hgb of 8.7 grams. HPI  Past Medical History:  Diagnosis Date  . Anemia   . Anxiety   . Arthritis   . Asthma   . Hypoglycemia   . Lupus   . Migraine     Past Surgical History:  Procedure Laterality Date  . APPENDECTOMY    . CESAREAN SECTION    . IUD REMOVAL [name] 11/12/2014  . TUBAL LIGATION      Family History  Problem Relation Age of Onset  . Suicidality Father   . Cancer Maternal Grandmother   . Cancer Paternal Grandmother     Social History Social History   Tobacco Use  . Smoking status: Never Smoker  . Smokeless tobacco: Never Used  Substance Use Topics  . Alcohol use: No    Alcohol/week: 0.0 oz  . Drug use: No    Comment: Quit in 1991    Allergies  Allergen Reactions  . Anesthetics, Amide Anaphylaxis and Other (See Comments)    Heart stopped and stopped breathing  . Trazodone Anaphylaxis    Tongue swells.  . Trazodone And Nefazodone Anaphylaxis    Tongue swells.  . Benadryl [Diphenhydramine]   . Effexor [Venlafaxine] Hives  . Nortriptyline Hives    Current Outpatient Medications  Medication Sig Dispense Refill  . ferrous sulfate 325 (65 FE) MG tablet Take 1 tablet (325 mg total) by mouth daily with breakfast. 60 tablet 5  . fluticasone (FLONASE) 50 MCG/ACT nasal spray Place 1 spray into both nostrils daily.    . naproxen (NAPROSYN) 500 MG tablet Take 500 mg by mouth 2 (two) times daily with a meal.    . oxyCODONE-acetaminophen (PERCOCET) 10-325 MG tablet Take 1 tablet by mouth every 4 (four) hours as needed for pain. 30 tablet 0  . albuterol (PROAIR HFA) 108 (90 BASE) MCG/ACT inhaler Inhale 2 puffs into the lungs every 4 (four) hours as needed for wheezing or  shortness of breath. (Patient not taking: Reported on 05/30/2017) 1 Inhaler 1  . beclomethasone (QVAR) 80 MCG/ACT inhaler Inhale 2 puffs into the lungs 2 (two) times daily. (Patient not taking: Reported on 08/09/2016) 1 Inhaler 3  . ferrous sulfate 325 (65 FE) MG tablet Take by mouth.    . ondansetron (ZOFRAN) 4 MG tablet Take 1 tablet (4 mg total) by mouth every 8 (eight) hours as needed for nausea or vomiting. (Patient not taking: Reported on 05/30/2017) 12 tablet 0   No current facility-administered medications for this visit.     Review of Systems Review of Systems Constitutional: negative for fatigue and weight loss Respiratory: negative for cough and wheezing Cardiovascular: negative for chest pain, fatigue and palpitations Gastrointestinal: negative for abdominal pain and change in bowel habits Genitourinary:positive for painful and heavy periods Integument/breast: positive for mastodynia Musculoskeletal:positive for myalgias for myalgias Neurological: negative for gait problems and tremors Behavioral/Psych: negative for abusive relationship, depression Endocrine: negative for temperature intolerance      Blood pressure 127/74, pulse 78, weight 151 lb 4.8 oz (68.6 kg), last menstrual period 05/05/2017.  Physical Exam Physical Exam:  Deferred  >50% of 15 min visit spent on counseling and coordination of care.  Data Reviewed Labs Ultrasound  Assessment     1. Iron deficiency anemia due to chronic blood loss Rx: - Ambulatory referral to Hematology - samples of Citranatal Bloom and Vitafol Fe dispensed  2. Abnormal uterine bleeding (AUB) - Hormonal Imbalance - considering a hysterectomy  3. Primary dysmenorrhea - Naprosyn and Oxycodone Rx    Plan    Follow up in 3 months   Orders Placed This Encounter  Procedures  . Ambulatory referral to Hematology    Referral Priority:   Routine    Referral Type:   Consultation    Referral Reason:   Specialty Services  Required    Requested Specialty:   Oncology    Number of Visits Requested:   1   No orders of the defined types were placed in this encounter.   Brock Bad MD

## 2017-06-14 ENCOUNTER — Telehealth: Payer: Self-pay | Admitting: Hematology

## 2017-06-14 ENCOUNTER — Encounter: Payer: Self-pay | Admitting: Hematology

## 2017-06-14 NOTE — Telephone Encounter (Signed)
Appt has been scheduled for the Danielle Holden to see Dr. Mosetta Putt on 2/18 at 230pm Danielle Holden aware to arrive 30 minutes early. Letter mailed to the Danielle Holden.

## 2017-06-17 ENCOUNTER — Encounter: Payer: Self-pay | Admitting: *Deleted

## 2017-06-19 ENCOUNTER — Other Ambulatory Visit: Payer: Self-pay | Admitting: Surgery

## 2017-06-19 DIAGNOSIS — K439 Ventral hernia without obstruction or gangrene: Secondary | ICD-10-CM

## 2017-06-23 ENCOUNTER — Emergency Department (HOSPITAL_COMMUNITY): Payer: Medicaid Other

## 2017-06-23 ENCOUNTER — Encounter (HOSPITAL_COMMUNITY): Payer: Self-pay | Admitting: *Deleted

## 2017-06-23 ENCOUNTER — Emergency Department (HOSPITAL_COMMUNITY)
Admission: EM | Admit: 2017-06-23 | Discharge: 2017-06-23 | Disposition: A | Discharge status: Home / Self Care | Payer: Medicaid Other | Attending: Emergency Medicine | Admitting: Emergency Medicine

## 2017-06-23 ENCOUNTER — Other Ambulatory Visit: Payer: Self-pay

## 2017-06-23 DIAGNOSIS — Z8673 Personal history of transient ischemic attack (TIA), and cerebral infarction without residual deficits: Secondary | ICD-10-CM | POA: Insufficient documentation

## 2017-06-23 DIAGNOSIS — R112 Nausea with vomiting, unspecified: Secondary | ICD-10-CM | POA: Insufficient documentation

## 2017-06-23 DIAGNOSIS — R1011 Right upper quadrant pain: Secondary | ICD-10-CM

## 2017-06-23 DIAGNOSIS — Z79899 Other long term (current) drug therapy: Secondary | ICD-10-CM | POA: Diagnosis not present

## 2017-06-23 DIAGNOSIS — J45909 Unspecified asthma, uncomplicated: Secondary | ICD-10-CM | POA: Insufficient documentation

## 2017-06-23 DIAGNOSIS — R52 Pain, unspecified: Secondary | ICD-10-CM

## 2017-06-23 LAB — I-STAT BETA HCG BLOOD, ED (MC, WL, AP ONLY): I-stat hCG, quantitative: <5 m[IU]/mL (ref <5)

## 2017-06-23 LAB — URINALYSIS, ROUTINE W REFLEX MICROSCOPIC
Bilirubin Urine: NEGATIVE
Glucose, UA: NEGATIVE mg/dL
Hgb urine dipstick: NEGATIVE
KETONES UR: NEGATIVE mg/dL
LEUKOCYTES UA: NEGATIVE
NITRITE: NEGATIVE
PH: 6 (ref 5.0–8.0)
PROTEIN: NEGATIVE mg/dL
Specific Gravity, Urine: 1.012 (ref 1.005–1.030)

## 2017-06-23 LAB — COMPREHENSIVE METABOLIC PANEL
ALBUMIN: 3.9 g/dL (ref 3.5–5.0)
ALK PHOS: 88 U/L (ref 38–126)
ALT: 14 U/L (ref 14–54)
AST: 19 U/L (ref 15–41)
Anion gap: 6 (ref 5–15)
BILIRUBIN TOTAL: 0.1 mg/dL — AB (ref 0.3–1.2)
BUN: 13 mg/dL (ref 6–20)
CALCIUM: 8.4 mg/dL — AB (ref 8.9–10.3)
CO2: 22 mmol/L (ref 22–32)
CREATININE: 0.59 mg/dL (ref 0.44–1.00)
Chloride: 107 mmol/L (ref 101–111)
GFR calc Af Amer: >60 mL/min (ref >60)
GLUCOSE: 96 mg/dL (ref 65–99)
Potassium: 3.7 mmol/L (ref 3.5–5.1)
Sodium: 135 mmol/L (ref 135–145)
TOTAL PROTEIN: 7.4 g/dL (ref 6.5–8.1)

## 2017-06-23 LAB — CBC
HCT: 31.3 % — ABNORMAL LOW (ref 36.0–46.0)
Hemoglobin: 9.1 g/dL — ABNORMAL LOW (ref 12.0–15.0)
MCH: 19 pg — ABNORMAL LOW (ref 26.0–34.0)
MCHC: 29.1 g/dL — AB (ref 30.0–36.0)
MCV: 65.2 fL — ABNORMAL LOW (ref 78.0–100.0)
PLATELETS: 312 10*3/uL (ref 150–400)
RBC: 4.8 MIL/uL (ref 3.87–5.11)
RDW: 20 % — AB (ref 11.5–15.5)
WBC: 10.3 10*3/uL (ref 4.0–10.5)

## 2017-06-23 LAB — LIPASE, BLOOD: Lipase: 32 U/L (ref 11–51)

## 2017-06-23 MED ORDER — KETOROLAC TROMETHAMINE 30 MG/ML IJ SOLN
30.0000 mg | Freq: Once | INTRAMUSCULAR | Status: AC
  Administered 2017-06-23: 30 mg via INTRAVENOUS
  Filled 2017-06-23: qty 1

## 2017-06-23 MED ORDER — ONDANSETRON 4 MG PO TBDP: 4.0000 mg | ORAL_TABLET | Freq: Three times a day (TID) | ORAL | 0 refills | Status: DC | PRN

## 2017-06-23 MED ORDER — SODIUM CHLORIDE 0.9 % IV BOLUS (SEPSIS)
1000.0000 mL | Freq: Once | INTRAVENOUS | Status: AC
  Administered 2017-06-23: 1000 mL via INTRAVENOUS

## 2017-06-23 MED ORDER — IOPAMIDOL (ISOVUE-300) INJECTION 61%
INTRAVENOUS | Status: AC
  Filled 2017-06-23: qty 100

## 2017-06-23 MED ORDER — HYDROCODONE-ACETAMINOPHEN 5-325 MG PO TABS
1.0000 | ORAL_TABLET | Freq: Once | ORAL | Status: AC
  Administered 2017-06-23: 1 via ORAL
  Filled 2017-06-23: qty 1

## 2017-06-23 MED ORDER — HYDROCODONE-ACETAMINOPHEN 5-325 MG PO TABS: 1.0000 | ORAL_TABLET | ORAL | 0 refills | Status: DC | PRN

## 2017-06-23 MED ORDER — ONDANSETRON HCL 4 MG/2ML IJ SOLN
4.0000 mg | Freq: Once | INTRAMUSCULAR | Status: AC
  Administered 2017-06-23: 4 mg via INTRAVENOUS
  Filled 2017-06-23: qty 2

## 2017-06-23 MED ORDER — ONDANSETRON 4 MG PO TBDP
4.0000 mg | ORAL_TABLET | Freq: Once | ORAL | Status: AC | PRN
  Administered 2017-06-23: 4 mg via ORAL
  Filled 2017-06-23: qty 1

## 2017-06-23 MED ORDER — MORPHINE SULFATE (PF) 4 MG/ML IV SOLN
4.0000 mg | Freq: Once | INTRAVENOUS | Status: AC
  Administered 2017-06-23: 4 mg via INTRAVENOUS
  Filled 2017-06-23: qty 1

## 2017-06-23 NOTE — ED Notes (Signed)
Pt aware urine sample is needed 

## 2017-06-23 NOTE — ED Provider Notes (Signed)
[facility]-EMERGENCY DEPT Provider Note   CSN: [id number] Arrival date & time: 06/23/17  1154     History   Chief Complaint Chief Complaint  Patient presents with  . Abdominal Pain    HPI Danielle Holden is a 47 y.o. female.  Pt presents to the ED today with abdominal pain and n/v.  The pt is scheduled for an [place] on the 14th, but she said her sx have worsened and she did not think she should wait.  The pt denies f/c.      Past Medical History:  Diagnosis Date  . Anemia   . Anxiety   . Arthritis   . Asthma   . Hypoglycemia   . Lupus   . Migraine     Patient Active Problem List   Diagnosis Date Noted  . Primary dysmenorrhea 06/13/2017  . Generalized anxiety disorder 07/26/2014  . Migraine without aura and without status migrainosus, not intractable 07/26/2014  . Hx of migraines 01/27/2014  . Migraine, unspecified, without mention of intractable migraine without mention of status migrainosus 09/28/2013  . Mastodynia, female 07/13/2013  . Abnormal uterine bleeding (AUB) 07/13/2013  . Iron deficiency anemia 06/30/2013  . TIA (transient ischemic attack) 06/27/2013  . Acute right-sided weakness 06/27/2013  . Anemia 06/27/2013  . Menorrhagia 06/27/2013  . Chest pain 06/27/2013    Past Surgical History:  Procedure Laterality Date  . APPENDECTOMY    . CESAREAN SECTION    . IUD REMOVAL [name] 11/12/2014  . TUBAL LIGATION      OB History    Gravida Para Term Preterm AB Living   4 3 3   1 3    SAB TAB Ectopic Multiple Live Births   1       3       Home Medications    Prior to Admission medications   Medication Sig Start Date End Date Taking? Authorizing Provider  ferrous sulfate 325 (65 FE) MG tablet Take 1 tablet (325 mg total) by mouth daily with breakfast. 05/30/17  Yes 06/01/17, MD  fluticasone (FLONASE) 50 MCG/ACT nasal spray Place 1 spray into both nostrils daily.   Yes [provider]  naproxen (NAPROSYN) 500 MG  tablet Take 500 mg by mouth 2 (two) times daily with a meal.   Yes [provider]  oxyCODONE-acetaminophen (PERCOCET) 10-325 MG tablet Take 1 tablet by mouth every 4 (four) hours as needed for pain. 05/30/17  Yes 06/01/17, MD  albuterol (PROAIR HFA) 108 (90 BASE) MCG/ACT inhaler Inhale 2 puffs into the lungs every 4 (four) hours as needed for wheezing or shortness of breath. Patient not taking: Reported on 05/30/2017 02/25/15   02/27/15, MD  beclomethasone (QVAR) 80 MCG/ACT inhaler Inhale 2 puffs into the lungs 2 (two) times daily. Patient not taking: Reported on 08/09/2016 02/25/15   02/27/15, MD  HYDROcodone-acetaminophen (NORCO/VICODIN) 5-325 MG tablet Take 1 tablet by mouth every 4 (four) hours as needed. 06/23/17   08/21/17, MD  ondansetron (ZOFRAN ODT) 4 MG disintegrating tablet Take 1 tablet (4 mg total) by mouth every 8 (eight) hours as needed. 06/23/17   08/21/17, MD  ondansetron (ZOFRAN) 4 MG tablet Take 1 tablet (4 mg total) by mouth every 8 (eight) hours as needed for nausea or vomiting. Patient not taking: Reported on 05/30/2017 08/10/16   08/12/16, MD    Family History Family History  Problem Relation Age of Onset  .  Suicidality Father   . Cancer Maternal Grandmother   . Cancer Paternal Grandmother     Social History Social History   Tobacco Use  . Smoking status: Never Smoker  . Smokeless tobacco: Never Used  Substance Use Topics  . Alcohol use: No    Alcohol/week: 0.0 oz  . Drug use: No    Comment: Quit in 1991     Allergies   Anesthetics, amide; Trazodone; Trazodone and nefazodone; Benadryl [diphenhydramine]; Effexor [venlafaxine]; and Nortriptyline   Review of Systems Review of Systems  Gastrointestinal: Positive for abdominal pain, nausea and vomiting.  All other systems reviewed and are negative.    Physical Exam Updated Vital Signs BP 130/69 (BP Location: Right Arm)   Pulse 74   Temp 98.2 F (36.8 C)    Resp 18   Wt 68.5 kg (151 lb)   SpO2 100%   BMI 26.75 kg/m   Physical Exam  Constitutional: She is oriented to person, place, and time. She appears well-developed and well-nourished.  HENT:  Head: Normocephalic and atraumatic.  Mouth/Throat: Oropharynx is clear and moist.  Eyes: EOM are normal. Pupils are equal, round, and reactive to light.  Cardiovascular: Normal rate, regular rhythm, normal heart sounds and intact distal pulses.  Pulmonary/Chest: Effort normal and breath sounds normal.  Abdominal: Soft. Normal appearance and bowel sounds are normal. There is tenderness in the right upper quadrant and epigastric area.  Neurological: She is alert and oriented to person, place, and time.  Skin: Skin is warm and dry. Capillary refill takes less than 2 seconds.  Psychiatric: She has a normal mood and affect. Her behavior is normal.  Nursing note and vitals reviewed.    ED Treatments / Results  Labs (all labs ordered are listed, but only abnormal results are displayed) Labs Reviewed  COMPREHENSIVE METABOLIC PANEL - Abnormal; Notable for the following components:      Result Value   Calcium 8.4 (*)    Total Bilirubin 0.1 (*)    All other components within normal limits  CBC - Abnormal; Notable for the following components:   Hemoglobin 9.1 (*)    HCT 31.3 (*)    MCV 65.2 (*)    MCH 19.0 (*)    MCHC 29.1 (*)    RDW 20.0 (*)    All other components within normal limits  LIPASE, BLOOD  URINALYSIS, ROUTINE W REFLEX MICROSCOPIC  I-STAT BETA HCG BLOOD, ED ([facility], [facility], [facility] ONLY)    EKG  EKG Interpretation None       Radiology Ct Renal Stone Study  Result Date: 06/23/2017 CLINICAL DATA:  Mid RIGHT abdominal pain, RIGHT RIGHT flank pain, pain radiating to back and shoulder, going on for months but worse in last few days, suspected stone disease, history asthma, lupus EXAM: CT ABDOMEN AND PELVIS WITHOUT CONTRAST TECHNIQUE: Multidetector CT imaging of the abdomen and pelvis was  performed following the standard protocol without IV contrast. Sagittal and coronal MPR images reconstructed from axial data set. Oral contrast not administered for this indication. COMPARISON:  02/25/2013 FINDINGS: Lower chest: Minimal dependent atelectasis at lung bases Hepatobiliary: Gallbladder and liver normal appearance Pancreas: Normal appearance Spleen: Normal appearance Adrenals/Urinary Tract: Adrenal glands normal appearance. Kidneys, ureters, and bladder normal appearance. No urinary tract calcification or dilatation. Stomach/Bowel: Appendix surgically absent by history. Mild gaseous distention of colon. Stomach and bowel loops otherwise normal appearance. Vascular/Lymphatic: Aorta normal caliber.  No adenopathy. Reproductive: Uterus and ovaries unremarkable. Other: Minimal free pelvic fluid. No free air.  Tiny umbilical hernia containing fat. No acute inflammatory process. Musculoskeletal: Unremarkable IMPRESSION: No acute intra-abdominal or intrapelvic abnormalities. Electronically Signed   By: Ulyses Southward M.D.   On: 06/23/2017 18:01   US Abdomen Limited Ruq  Result Date: 06/23/2017 CLINICAL DATA:  RIGHT upper quadrant pain and nausea for 2 days EXAM: ULTRASOUND ABDOMEN LIMITED RIGHT UPPER QUADRANT COMPARISON:  08/09/2016 FINDINGS: Gallbladder: Normally distended without stones or wall thickening. No pericholecystic fluid or sonographic Murphy sign. Common bile duct: Diameter: Normal caliber 5 mm diameter Liver: Normal appearance No RIGHT upper quadrant free fluid. Portal vein is patent on color Doppler imaging with normal direction of blood flow towards the liver. No RIGHT upper quadrant free fluid IMPRESSION: Normal exam. Electronically Signed   By: Ulyses Southward M.D.   On: 06/23/2017 17:06    Procedures Procedures (including critical care time)  Medications Ordered in ED Medications  iopamidol (ISOVUE-300) 61 % injection (not administered)  ketorolac (TORADOL) 30 MG/ML injection 30 mg (not  administered)  HYDROcodone-acetaminophen (NORCO/VICODIN) 5-325 MG per tablet 1 tablet (not administered)  ondansetron (ZOFRAN-ODT) disintegrating tablet 4 mg (4 mg Oral Given 06/23/17 1305)  sodium chloride 0.9 % bolus 1,000 mL (1,000 mLs Intravenous New Bag/Given 06/23/17 1721)  ondansetron (ZOFRAN) injection 4 mg (4 mg Intravenous Given 06/23/17 1722)  morphine 4 MG/ML injection 4 mg (4 mg Intravenous Given 06/23/17 1722)     Initial Impression / Assessment and Plan / ED Course  I have reviewed the triage vital signs and the nursing notes.  Pertinent labs & imaging results that were available during my care of the patient were reviewed by me and considered in my medical decision making (see chart for details).    Pt is feeling better, but still has some RUQ pain in the setting of a normal gallbladder US and nl CT abd/pelvis.  Pt encouraged to f/u with her doctor and get a HIDA test ordered as an outpatient if pain persists.  Return if worse.  Final Clinical Impressions(s) / ED Diagnoses   Final diagnoses:  Pain  Right upper quadrant abdominal pain    ED Discharge Orders        Ordered    HYDROcodone-acetaminophen (NORCO/VICODIN) 5-325 MG tablet  Every 4 hours PRN     06/23/17 1819    ondansetron (ZOFRAN ODT) 4 MG disintegrating tablet  Every 8 hours PRN     06/23/17 1819       Jacalyn Lefevre, MD 06/23/17 1820

## 2017-06-23 NOTE — ED Notes (Signed)
Bed: WA05 Expected date:  Expected time:  Means of arrival:  Comments: 

## 2017-06-23 NOTE — Discharge Instructions (Signed)
May need HIDA scan if abdominal pain persists.  Your primary care doctor can order this test.

## 2017-06-23 NOTE — ED Triage Notes (Signed)
Pt scheduled for gall bladder US this Thursday, started with abd pain and nausea yesterday, feels it is related to gall bladder again

## 2017-06-23 NOTE — ED Notes (Signed)
US at bedside

## 2017-06-27 ENCOUNTER — Other Ambulatory Visit: Payer: Medicaid Other

## 2017-07-01 ENCOUNTER — Telehealth: Payer: Self-pay | Admitting: Nurse Practitioner

## 2017-07-01 ENCOUNTER — Inpatient Hospital Stay: Payer: Medicaid Other

## 2017-07-01 ENCOUNTER — Encounter: Payer: Self-pay | Admitting: Nurse Practitioner

## 2017-07-01 ENCOUNTER — Inpatient Hospital Stay: Payer: Medicaid Other | Attending: Hematology | Admitting: Nurse Practitioner

## 2017-07-01 VITALS — BP 115/61 | HR 72 | Temp 98.4°F | Resp 14 | Ht 63.0 in | Wt 152.5 lb

## 2017-07-01 DIAGNOSIS — K529 Noninfective gastroenteritis and colitis, unspecified: Secondary | ICD-10-CM | POA: Insufficient documentation

## 2017-07-01 DIAGNOSIS — K819 Cholecystitis, unspecified: Secondary | ICD-10-CM | POA: Diagnosis not present

## 2017-07-01 DIAGNOSIS — D500 Iron deficiency anemia secondary to blood loss (chronic): Secondary | ICD-10-CM

## 2017-07-01 DIAGNOSIS — M069 Rheumatoid arthritis, unspecified: Secondary | ICD-10-CM | POA: Diagnosis not present

## 2017-07-01 DIAGNOSIS — F419 Anxiety disorder, unspecified: Secondary | ICD-10-CM | POA: Insufficient documentation

## 2017-07-01 DIAGNOSIS — D509 Iron deficiency anemia, unspecified: Secondary | ICD-10-CM | POA: Diagnosis not present

## 2017-07-01 DIAGNOSIS — Z79899 Other long term (current) drug therapy: Secondary | ICD-10-CM

## 2017-07-01 DIAGNOSIS — M1990 Unspecified osteoarthritis, unspecified site: Secondary | ICD-10-CM | POA: Diagnosis not present

## 2017-07-01 LAB — CBC WITH DIFFERENTIAL (CANCER CENTER ONLY)
BASOS ABS: 0 10*3/uL (ref 0.0–0.1)
Basophils Relative: 0 %
EOS ABS: 0.3 10*3/uL (ref 0.0–0.5)
EOS PCT: 3 %
HCT: 36.4 % (ref 34.8–46.6)
Hemoglobin: 10.6 g/dL — ABNORMAL LOW (ref 11.6–15.9)
LYMPHS PCT: 23 %
Lymphs Abs: 2.3 10*3/uL (ref 0.9–3.3)
MCH: 20 pg — ABNORMAL LOW (ref 25.1–34.0)
MCHC: 29.1 g/dL — ABNORMAL LOW (ref 31.5–36.0)
MCV: 68.7 fL — AB (ref 79.5–101.0)
Monocytes Absolute: 0.7 10*3/uL (ref 0.1–0.9)
Monocytes Relative: 7 %
Neutro Abs: 6.4 10*3/uL (ref 1.5–6.5)
Neutrophils Relative %: 67 %
PLATELETS: 346 10*3/uL (ref 145–400)
RBC: 5.3 MIL/uL (ref 3.70–5.45)
RDW: 23.3 % — ABNORMAL HIGH (ref 11.2–14.5)
WBC Count: 9.7 10*3/uL (ref 3.9–10.3)

## 2017-07-01 LAB — RETICULOCYTES
RBC.: 5.3 MIL/uL (ref 3.70–5.45)
RETIC COUNT ABSOLUTE: 116.6 10*3/uL — AB (ref 33.7–90.7)
RETIC CT PCT: 2.2 % — AB (ref 0.7–2.1)

## 2017-07-01 NOTE — Telephone Encounter (Signed)
Gave patient AVs and calendar of upcoming March appointments. °

## 2017-07-01 NOTE — Progress Notes (Addendum)
[place] [facility]  [phone] [phone]  Clinic New consult Note   Patient Care Team: Patient, No Pcp Per as PCP - General (General Practice) Brock Bad, MD as Consulting Physician (Obstetrics and Gynecology) Malachy Mood, MD as Consulting Physician (Hematology) Pollyann Samples, NP as Nurse Practitioner (Nurse Practitioner) 07/01/2017  CHIEF COMPLAINTS/PURPOSE OF CONSULTATION:  Iron deficiency anemia   REFERRING PROVIDER: Dr. Coral Ceo   HISTORY OF PRESENTING ILLNESS:   Danielle Holden 47 y.o. female is here because of iron deficiency anemia. She was referred by ObGYN Dr. Clearance Coots. She does not have a PCP. She reports having anemia since childhood, intermittently treated with oral iron and multivitamins per her mother. She had a hypochromic, microcytic anemia that was first evident on labs in 2009 per [facility] when she lived in [place], Hgb 10.9. She moved to [facility] in 2014, Hgb 11.5 at that time. On 06/18/13 she presented to our local ER for chest tightness, headache, dizziness, and weakness. Work up revealed severely low Hgb 7.4, iron studies revealed ferritin 2, serum iron 15 with very low %sat 4; she was started on 1 tablet daily oral iron replacement and told to f/u with ObGyn. She has been taking oral iron consistently since then.  Recently switched to prenatal vitamin because ferrous sulfate caused constipation. In 11/2016 during a period where she lived in [place] again, she noted blood and mucus in her stool with associated abdominal pain. She had colonoscopy and reportedly was diagnosed with colitis and IBS. I do not have that report or any biopsy that was done. Iron studies at that time again noted to be severely low, ferritin 3, serum iron 17, %sat 3, and elevated TIBC 606 despite taking oral iron supplement.  She reports history of heavy and irregular menses, often with 2 cycles in the same month with heavy bleeding up to 14-20 days  per month. She has tried numerous hormonal therapies including OCP and IUD ultimately having to discontinue due to side effects. She and her OBGyn are considering partial hysterectomy.  Occasionally takes naproxen for dysmenorrhea; she is not on anticoagulant. Denies recent bleeding such as epistaxis or hematochezia.  She had a UA in the ER recently that was positive for RBCs but she denies frank hematuria.  She has increased fatigue. Occasional dry cough; no fever, chills, or recent infection.  She has a history of vertigo with occasional dizziness.  She does report occasional palpitations but attributes this to her history of anxiety.  Has intermittent nausea, abdominal ultrasound is pending per Dr. Cliffton Asters.  Bowel movements currently are normal.  She denies recent chest pain, dyspnea on exertion, shortness of breath, lightheadedness, headache, or syncope.   She had no prior history or diagnosis of cancer.  Family history is positive for GYN malignancy in MGM and PGM. PAP smear and mammogram are up-to-date. She denies any pica.  Does not eat red meat in her diet due to IBS. She never donated blood or received blood transfusion.  Other past medical history positive for asthma, lupus, RA, migraine with aura, and ventral hernia.  She is planning to undergo cholecystectomy per Dr. Cliffton Asters in 07/31/2017.  She has no known family history of anemia or blood disorder.  She is not working, disabled due to her anxiety.  She has 3 children who are alive and healthy.  She lives alone.  MEDICAL HISTORY:  Past Medical History:  Diagnosis Date  . Anemia   . Anxiety   .  Arthritis    hands, knees, shoulder, back   . Asthma   . Hypoglycemia   . Lupus   . Migraine     SURGICAL HISTORY: Past Surgical History:  Procedure Laterality Date  . APPENDECTOMY    . CESAREAN SECTION    . IUD REMOVAL [name] 11/12/2014  . TUBAL LIGATION      SOCIAL HISTORY: Social History   Socioeconomic History  . Marital status: Single     Spouse name: Not on file  . Number of children: 3  . Years of education: 9th  . Highest education level: Not on file  Social Needs  . Financial resource strain: Not on file  . Food insecurity - worry: Not on file  . Food insecurity - inability: Not on file  . Transportation needs - medical: Not on file  . Transportation needs - non-medical: Not on file  Occupational History    Comment: disabled due to anxiety  Tobacco Use  . Smoking status: Never Smoker  . Smokeless tobacco: Never Used  Substance and Sexual Activity  . Alcohol use: No    Alcohol/week: 0.0 oz  . Drug use: No    Comment: Quit in 1991  . Sexual activity: Not Currently    Partners: Male    Birth control/protection: None, Surgical    Comment: Last encounter 1 week ago  Other Topics Concern  . Not on file  Social History Narrative   Patient lives at home alone and she is disabled.   Education 9th grade.   Right handed.   Caffeine one cup of coffee not daily.     FAMILY HISTORY: Family History  Problem Relation Age of Onset  . Suicidality Father   . Cancer Maternal Grandmother        breast, cervical  . Cancer Paternal Grandmother        cervical     ALLERGIES:  is allergic to anesthetics, amide; trazodone; trazodone and nefazodone; benadryl [diphenhydramine]; effexor [venlafaxine]; and nortriptyline.  MEDICATIONS:  Current Outpatient Medications  Medication Sig Dispense Refill  . ferrous sulfate 325 (65 FE) MG tablet Take 1 tablet (325 mg total) by mouth daily with breakfast. 60 tablet 5  . naproxen (NAPROSYN) 500 MG tablet Take 500 mg by mouth 2 (two) times daily with a meal.    . ondansetron (ZOFRAN ODT) 4 MG disintegrating tablet Take 1 tablet (4 mg total) by mouth every 8 (eight) hours as needed. 10 tablet 0  . albuterol (PROAIR HFA) 108 (90 BASE) MCG/ACT inhaler Inhale 2 puffs into the lungs every 4 (four) hours as needed for wheezing or shortness of breath. (Patient not taking: Reported on  05/30/2017) 1 Inhaler 1   No current facility-administered medications for this visit.     REVIEW OF SYSTEMS:   Constitutional: Denies fevers, chills or abnormal night sweats (+) increased fatigue  Eyes: Denies blurriness of vision, double vision or watery eyes Ears, nose, mouth, throat, and face: Denies mucositis or sore throat Respiratory: Denies dyspnea or wheezes (+) occasional dry cough Cardiovascular: Denies palpitation, chest discomfort or lower extremity swelling Gastrointestinal:  Denies vomiting, diarrhea, constipation, hematochezia, heartburn or change in bowel habits (+) intermittent nausea (+) history of colitis, IBS (+) constipation oral ferrous sulfate Skin: Denies abnormal skin rashes Lymphatics: Denies new lymphadenopathy or easy bruising Neurological:Denies numbness, tingling or new weaknesses Behavioral/Psych: Mood is stable, no new changes (+) history of anxiety All other systems were reviewed with the patient and are negative.  PHYSICAL EXAMINATION: ECOG  PERFORMANCE STATUS: 1 - Symptomatic but completely ambulatory  Vitals:   07/01/17 1437  BP: 115/61  Pulse: 72  Resp: 14  Temp: 98.4 F (36.9 C)  SpO2: 100%   Filed Weights   07/01/17 1437  Weight: 152 lb 8 oz (69.2 kg)    GENERAL:alert, no distress and comfortable SKIN: skin color, texture, turgor are normal, no rashes or significant lesions EYES: normal, conjunctiva are pink and non-injected, sclera clear OROPHARYNX:no exudate, no erythema and lips, buccal mucosa, and tongue normal  NECK: supple, thyroid normal size, non-tender, without nodularity LYMPH:  no palpable cervical, supraclavicular, axillary, or inguinal lymphadenopathy  LUNGS: clear to auscultation bilaterally with normal breathing effort HEART: regular rate & rhythm and no murmurs and no lower extremity edema ABDOMEN:abdomen soft, non-tender and normal bowel sounds.  No organomegaly Musculoskeletal:no cyanosis of digits and no clubbing    PSYCH: alert & oriented x 3 with fluent speech NEURO: no focal motor/sensory deficits  LABORATORY DATA:  I have reviewed the data as listed CBC Latest Ref Rng & Units 07/01/2017 06/23/2017 05/30/2017  WBC 3.9 - 10.3 K/uL 9.7 10.3 7.5  Hemoglobin 12.0 - 15.0 g/dL - 9.1(L) 8.7(L)  Hematocrit 34.8 - 46.6 % 36.4 31.3(L) 28.6(L)  Platelets 145 - 400 K/uL 346 312 340    CMP Latest Ref Rng & Units 06/23/2017 05/30/2017 08/09/2016  Glucose 65 - 99 mg/dL 96 81 86  BUN 6 - 20 mg/dL 13 11 14   Creatinine 0.44 - 1.00 mg/dL 7.94 8.01 6.55  Sodium 135 - 145 mmol/L 135 142 136  Potassium 3.5 - 5.1 mmol/L 3.7 4.3 3.5  Chloride 101 - 111 mmol/L 107 105 107  CO2 22 - 32 mmol/L 22 20 22   Calcium 8.9 - 10.3 mg/dL 3.7(S) 8.2(L) 0.7(E)  Total Protein 6.5 - 8.1 g/dL 7.4 7.0 7.6  Total Bilirubin 0.3 - 1.2 mg/dL 6.7(J) <4.4 0.3  Alkaline Phos 38 - 126 U/L 88 81 77  AST 15 - 41 U/L 19 18 17   ALT 14 - 54 U/L 14 11 14    Iron studies 11/2016: Ferritin 3 Serum iron 17 Percent saturation 3 TIBC 606  07/01/2017: Pending  RADIOGRAPHIC STUDIES: I have personally reviewed the radiological images as listed and agreed with the findings in the report. Mm Diag Breast Tomo Bilateral  Result Date: 06/05/2017 CLINICAL DATA:  The patient had a screening mammogram at [facility] in [place] on 11/17/2016 which was compared to her previous screening mammogram dated 09/21/2015. A focal asymmetry was seen in the 5 o'clock position of the left breast, posteriorly, on the screening mammogram. Therefore, additional mammogram views and possible ultrasound were recommended. However, the patient did not return for the additional imaging. The screening mammogram dated 11/17/2016 is not available for comparison at this time. Diffuse bilateral breast pain for the past at least 5-10 years. The patient reports no new symptoms. EXAM: 2D DIGITAL DIAGNOSTIC BILATERAL MAMMOGRAM WITH CAD AND ADJUNCT TOMO COMPARISON:  Previous exam(s). ACR  Breast Density Category c: The breast tissue is heterogeneously dense, which may obscure small masses. FINDINGS: Heterogeneously dense tissue in both breasts is unchanged compared previous examinations. There has also been no significant change in diffuse scattered calcifications in both breasts. There are no interval findings suspicious for malignancy. Mammographic images were processed with CAD. IMPRESSION: Stable mammographic appearance of the breasts with no interval findings suspicious for malignancy. However, direct comparison with the patient's screening mammogram dated 11/17/2016 is necessary to determine if further imaging is indicated. RECOMMENDATION:  Comparison with the patient's screening mammogram dated 11/17/2016. We will obtain those images for comparison and a reported addendum will be issued. I have discussed the findings and recommendations with the patient. Results were also provided in writing at the conclusion of the visit. If applicable, a reminder letter will be sent to the patient regarding the next appointment. BI-RADS CATEGORY  0: Incomplete. Need additional imaging evaluation and/or prior mammograms for comparison. Electronically Signed   By: Beckie Salts M.D.   On: 06/05/2017 14:23   Ct Renal Stone Study  Result Date: 06/23/2017 CLINICAL DATA:  Mid RIGHT abdominal pain, RIGHT RIGHT flank pain, pain radiating to back and shoulder, going on for months but worse in last few days, suspected stone disease, history asthma, lupus EXAM: CT ABDOMEN AND PELVIS WITHOUT CONTRAST TECHNIQUE: Multidetector CT imaging of the abdomen and pelvis was performed following the standard protocol without IV contrast. Sagittal and coronal MPR images reconstructed from axial data set. Oral contrast not administered for this indication. COMPARISON:  02/25/2013 FINDINGS: Lower chest: Minimal dependent atelectasis at lung bases Hepatobiliary: Gallbladder and liver normal appearance Pancreas: Normal appearance  Spleen: Normal appearance Adrenals/Urinary Tract: Adrenal glands normal appearance. Kidneys, ureters, and bladder normal appearance. No urinary tract calcification or dilatation. Stomach/Bowel: Appendix surgically absent by history. Mild gaseous distention of colon. Stomach and bowel loops otherwise normal appearance. Vascular/Lymphatic: Aorta normal caliber.  No adenopathy. Reproductive: Uterus and ovaries unremarkable. Other: Minimal free pelvic fluid. No free air. Tiny umbilical hernia containing fat. No acute inflammatory process. Musculoskeletal: Unremarkable IMPRESSION: No acute intra-abdominal or intrapelvic abnormalities. Electronically Signed   By: Ulyses Southward M.D.   On: 06/23/2017 18:01   US Pelvic Complete With Transvaginal  Result Date: 06/07/2017 CLINICAL DATA:  Abnormal uterine bleeding, painful menses for 2 months, irregular cycles throughout life EXAM: TRANSABDOMINAL AND TRANSVAGINAL ULTRASOUND OF PELVIS TECHNIQUE: Both transabdominal and transvaginal ultrasound examinations of the pelvis were performed. Transabdominal technique was performed for global imaging of the pelvis including uterus, ovaries, adnexal regions, and pelvic cul-de-sac. It was necessary to proceed with endovaginal exam following the transabdominal exam to visualize the uterus, endometrium, and ovaries. COMPARISON:  None FINDINGS: Uterus Measurements: 8.7 x 4.7 x 5.1 cm. Normal morphology without mass. Nabothian cysts at cervix. Endometrium Thickness: 8 mm thick.  No endometrial fluid or focal abnormality Right ovary Measurements: 3.2 x 1.7 x 1.3 cm.  Normal morphology without mass Left ovary Not visualized on either transabdominal or endovaginal imaging question obscured by bowel; numerous peristalsing bowel loops are seen within the pelvis. Other findings Trace free pelvic fluid.  No adnexal masses. IMPRESSION: Nonvisualization of LEFT ovary. Trace free pelvic fluid, potentially physiologic. Otherwise negative exam.  Electronically Signed   By: Ulyses Southward M.D.   On: 06/07/2017 14:35   US Abdomen Limited Ruq  Result Date: 06/23/2017 CLINICAL DATA:  RIGHT upper quadrant pain and nausea for 2 days EXAM: ULTRASOUND ABDOMEN LIMITED RIGHT UPPER QUADRANT COMPARISON:  08/09/2016 FINDINGS: Gallbladder: Normally distended without stones or wall thickening. No pericholecystic fluid or sonographic Murphy sign. Common bile duct: Diameter: Normal caliber 5 mm diameter Liver: Normal appearance No RIGHT upper quadrant free fluid. Portal vein is patent on color Doppler imaging with normal direction of blood flow towards the liver. No RIGHT upper quadrant free fluid IMPRESSION: Normal exam. Electronically Signed   By: Ulyses Southward M.D.   On: 06/23/2017 17:06    ASSESSMENT & PLAN: Iviana Bauwens is a 47 year old female with history of iron deficiency anemia, colitis,  IBS, lupus, and RA.   1.  Anemia, iron deficiency due to chronic blood loss  -We reviewed her medical record in detail.  She has a long-standing history of a hypochromic, microcytic anemia, elevated RDW, and low iron studies, consistent with iron deficiency anemia.  Anemia is likely secondary to chronic GYN blood loss. She is planning to undergo hysterectomy in the future. She has been on oral supplementation for at least 5 years, recent iron study in 2018 still significantly low.  We will recheck iron studies today.  Plan to give IV Feraheme in 1 and 2 weeks. We reviewed side effects including infusion hypersensitivity and anaphylaxis, she agrees to proceed. If her iron studies do not improve, will obtain other labs to explore other causes for her anemia such as thalassemia.   2.  Colitis, IBS -Due to her history of colitis, it's likely she is not absorbing oral iron replacement and therefore will need IV Feraheme.  Will obtain celiac panel to see if that is contributing to poor oral iron absorption.  3.  Lupus, RA -Seen by Dr. Sharmon Revere at [facility] in  [facility] in 20/2017 for positive ANA, fibromyalgia, and Raynaud's disease without gangrene.  It was determined she did not need immune therapy at that time, recommended observation only.  She was told to return in 1 year around 02/2017.  She has not followed up, She was encouraged to call to make f/u appt today. -It is possible her anemia is also related to chronic disease  4.  Cholecystitis, ventral hernia  -She is planning to undergo cholecystectomy and hernia repair per Dr. Marin Olp at [facility] in March 2019.  We plan to give IV Feraheme in 1 and 2 weeks which will hopefully improve her anemia in time for surgery.  Will check labs in 2 weeks prior to procedure.   PLAN: -Lab today -IV Feraheme in 1 and 2 weeks -Labs in 2 and 4 weeks -F/u in 4 weeks  All questions were answered. The patient knows to call the clinic with any problems, questions or concerns. I spent 50 minutes counseling the patient face to face. The total time spent in the appointment was 60 minutes and more than 50% was on counseling.     Pollyann Samples, NP 07/01/17 5:24 PM   Addendum I have seen the patient, examined her. I agree with the assessment and and plan and have edited the notes.   Danielle Holden is a 47 year old female, here for evaluation of her anemia.  Her lab results support iron deficient anemia,  Likely secondary to her menorrhagia.  She has some GI issue, will also check a celiac panel, since she has not responded to oral iron well.  She also has questionable lupus, which can also cause anemia of chronic disease.  Given her moderate anemia, pending cholecystectomy in a month, I recommend IV iron weekly twice, starting next week.  With her, especially anaphylactic reaction.  She agrees to proceed.  We will follow-up her CBC and iron level closely, see her back in a month before her surgery.  Malachy Mood 07/01/2017

## 2017-07-02 LAB — IRON AND TIBC
Iron: 17 ug/dL — ABNORMAL LOW (ref 41–142)
SATURATION RATIOS: 3 % — AB (ref 21–57)
TIBC: 535 ug/dL — ABNORMAL HIGH (ref 236–444)
UIBC: 518 ug/dL

## 2017-07-02 LAB — FERRITIN: Ferritin: 13 ng/mL (ref 9–269)

## 2017-07-03 LAB — CELIAC PANEL 10
ANTIGLIADIN ABS, IGA: 5 U (ref 0–19)
Endomysial Ab, IgA: NEGATIVE
Gliadin IgG: 3 units (ref 0–19)
IgA: 248 mg/dL (ref 87–352)
Tissue Transglut Ab: <2 U/mL (ref 0–5)
Tissue Transglutaminase Ab, IgA: <2 U/mL (ref 0–3)

## 2017-07-05 ENCOUNTER — Other Ambulatory Visit: Payer: Self-pay | Admitting: Obstetrics

## 2017-07-05 DIAGNOSIS — R928 Other abnormal and inconclusive findings on diagnostic imaging of breast: Secondary | ICD-10-CM

## 2017-07-10 ENCOUNTER — Inpatient Hospital Stay: Payer: Medicaid Other

## 2017-07-10 ENCOUNTER — Other Ambulatory Visit: Payer: Self-pay | Admitting: Nurse Practitioner

## 2017-07-10 VITALS — BP 110/64 | HR 69 | Temp 98.4°F | Resp 18

## 2017-07-10 DIAGNOSIS — D509 Iron deficiency anemia, unspecified: Secondary | ICD-10-CM | POA: Diagnosis not present

## 2017-07-10 DIAGNOSIS — D500 Iron deficiency anemia secondary to blood loss (chronic): Secondary | ICD-10-CM

## 2017-07-10 MED ORDER — SODIUM CHLORIDE 0.9 % IV SOLN
Freq: Once | INTRAVENOUS | Status: AC
  Administered 2017-07-10: 09:00:00 via INTRAVENOUS

## 2017-07-10 MED ORDER — SODIUM CHLORIDE 0.9 % IV SOLN
510.0000 mg | Freq: Once | INTRAVENOUS | Status: AC
  Administered 2017-07-10: 510 mg via INTRAVENOUS
  Filled 2017-07-10: qty 17

## 2017-07-10 NOTE — Progress Notes (Signed)
Pt tolerated first time iron infusion today. Discussed and reviewed possible side effects that she may experience after infusion. Symptoms may last a few days. Advised pt to call MD right away if she has any concerns regarding symptoms from iron infusion today. Pt verbalized understanding and has no further questions at this time.

## 2017-07-10 NOTE — Patient Instructions (Signed)

## 2017-07-11 ENCOUNTER — Ambulatory Visit
Admission: RE | Admit: 2017-07-11 | Discharge: 2017-07-11 | Disposition: A | Discharge status: Home / Self Care | Payer: Medicaid Other | Source: Ambulatory Visit | Attending: Obstetrics | Admitting: Obstetrics

## 2017-07-11 DIAGNOSIS — R928 Other abnormal and inconclusive findings on diagnostic imaging of breast: Secondary | ICD-10-CM

## 2017-07-17 ENCOUNTER — Inpatient Hospital Stay: Payer: Medicaid Other | Attending: Hematology

## 2017-07-17 ENCOUNTER — Inpatient Hospital Stay: Payer: Medicaid Other

## 2017-07-17 VITALS — BP 130/67 | HR 78 | Temp 98.2°F | Resp 18

## 2017-07-17 DIAGNOSIS — K819 Cholecystitis, unspecified: Secondary | ICD-10-CM | POA: Insufficient documentation

## 2017-07-17 DIAGNOSIS — N920 Excessive and frequent menstruation with regular cycle: Secondary | ICD-10-CM | POA: Insufficient documentation

## 2017-07-17 DIAGNOSIS — M069 Rheumatoid arthritis, unspecified: Secondary | ICD-10-CM | POA: Insufficient documentation

## 2017-07-17 DIAGNOSIS — D500 Iron deficiency anemia secondary to blood loss (chronic): Secondary | ICD-10-CM | POA: Insufficient documentation

## 2017-07-17 DIAGNOSIS — K439 Ventral hernia without obstruction or gangrene: Secondary | ICD-10-CM | POA: Insufficient documentation

## 2017-07-17 DIAGNOSIS — K529 Noninfective gastroenteritis and colitis, unspecified: Secondary | ICD-10-CM | POA: Diagnosis not present

## 2017-07-17 DIAGNOSIS — Z79899 Other long term (current) drug therapy: Secondary | ICD-10-CM | POA: Diagnosis not present

## 2017-07-17 DIAGNOSIS — M329 Systemic lupus erythematosus, unspecified: Secondary | ICD-10-CM | POA: Insufficient documentation

## 2017-07-17 DIAGNOSIS — R42 Dizziness and giddiness: Secondary | ICD-10-CM | POA: Insufficient documentation

## 2017-07-17 DIAGNOSIS — D509 Iron deficiency anemia, unspecified: Secondary | ICD-10-CM | POA: Diagnosis present

## 2017-07-17 LAB — CBC WITH DIFFERENTIAL (CANCER CENTER ONLY)
BAND NEUTROPHILS: 0 %
BASOS PCT: 0 %
Basophils Absolute: 0 10*3/uL (ref 0.0–0.1)
Blasts: 0 %
EOS ABS: 0.2 10*3/uL (ref 0.0–0.5)
Eosinophils Relative: 4 %
HCT: 38 % (ref 34.8–46.6)
Hemoglobin: 11.2 g/dL — ABNORMAL LOW (ref 11.6–15.9)
Lymphocytes Relative: 23 %
Lymphs Abs: 1.3 10*3/uL (ref 0.9–3.3)
MCH: 21.4 pg — ABNORMAL LOW (ref 25.1–34.0)
MCHC: 29.5 g/dL — ABNORMAL LOW (ref 31.5–36.0)
MCV: 72.7 fL — ABNORMAL LOW (ref 79.5–101.0)
MONO ABS: 0.4 10*3/uL (ref 0.1–0.9)
Metamyelocytes Relative: 0 %
Monocytes Relative: 7 %
Myelocytes: 0 %
NEUTROS ABS: 3.9 10*3/uL (ref 1.5–6.5)
Neutrophils Relative %: 66 %
Other: 0 %
PLATELETS: 309 10*3/uL (ref 145–400)
PROMYELOCYTES ABS: 0 %
RBC: 5.23 MIL/uL (ref 3.70–5.45)
RDW: 26.2 % — AB (ref 11.2–14.5)
WBC Count: 5.8 10*3/uL (ref 3.9–10.3)
nRBC: 0 /100 WBC

## 2017-07-17 LAB — IRON AND TIBC
Iron: 179 ug/dL — ABNORMAL HIGH (ref 41–142)
Saturation Ratios: 43 % (ref 21–57)
TIBC: 416 ug/dL (ref 236–444)
UIBC: 238 ug/dL

## 2017-07-17 LAB — RETICULOCYTES
RBC.: 5.23 MIL/uL (ref 3.70–5.45)
RETIC COUNT ABSOLUTE: 68 10*3/uL (ref 33.7–90.7)
Retic Ct Pct: 1.3 % (ref 0.7–2.1)

## 2017-07-17 LAB — FERRITIN: Ferritin: 432 ng/mL — ABNORMAL HIGH (ref 9–269)

## 2017-07-17 MED ORDER — SODIUM CHLORIDE 0.9 % IV SOLN
Freq: Once | INTRAVENOUS | Status: AC
  Administered 2017-07-17: 10:00:00 via INTRAVENOUS

## 2017-07-17 MED ORDER — SODIUM CHLORIDE 0.9 % IV SOLN
510.0000 mg | Freq: Once | INTRAVENOUS | Status: AC
  Administered 2017-07-17: 510 mg via INTRAVENOUS
  Filled 2017-07-17: qty 17

## 2017-07-17 NOTE — Patient Instructions (Signed)

## 2017-07-28 NOTE — Progress Notes (Addendum)
[facility]  [phone] [phone]  Clinic Follow up Note   Patient Care Team: Patient, No Pcp Per as PCP - General (General Practice) Brock Bad, MD as Consulting Physician (Obstetrics and Gynecology) Malachy Mood, MD as Consulting Physician (Hematology) Pollyann Samples, NP as Nurse Practitioner (Nurse Practitioner) 07/29/2017  CHIEF COMPLAINT:  Iron deficiency anemia   CURRENT THERAPY: IV Feraheme 2/27, 3/6   INTERVAL HISTORY: Danielle Holden returns for follow up anemia as scheduled. S/p iron infusions on 2/27 and 3/6, she tolerated well. She had darkening to her tongue after each infusion that occurred in the past when she was on oral iron supplements. No other signs of hypersensitivity during infusion. Her fatigue has improved although she still requires rest periods throughout the day. Continues to have dizziness related to vertigo, and frequent headaches. Her current headache has been intermittent for past 2 weeks. Usually occurs on the right side. This is a normal headache her her. She takes naprosyn PRN but tries to avoid because it makes her menses heavier and occasionally causes blood in her stool. She had a colonoscopy in [place] but cannot recall the facility or name of doctor who performed it, she only has a telephone number. No current hematochezia or hematuria. LMP 3/4-3/11, heaviest first 3 days, requiring 8-10 feminine products per day. She had f/u with Dr. Clearance Coots GYN who recommended partial hysterectomy potentially in June or July after she recovers from cholecystectomy on 08/07/17 per Dr. Cliffton Asters. She continues to try to get PCP but has had some difficulty, on numerous wait lists; she attributes her troubles to Medicaid.  REVIEW OF SYSTEMS:   Constitutional: Denies fevers, chills or abnormal weight loss (+) fatigue, improved Eyes: Denies blurriness of vision Ears, nose, mouth, throat, and face: Denies mucositis or sore throat (+) dark  tongue  Respiratory: Denies cough, dyspnea or wheezes Cardiovascular: Denies palpitation, chest discomfort or lower extremity swelling Gastrointestinal:  Denies heartburn or change in bowel habits (+) intermittent nausea (+) hernia (+) intermittent hematochezia with heavy NSAID use. GU/GYN: Denies hematuria, dysuria, urgency (+) frequency r/t hydration (+) LMP 3/4-3/11/19  Skin: Denies abnormal skin rashes Lymphatics: Denies new lymphadenopathy or easy bruising Neurological:Denies numbness, tingling or new weaknesses (+) Lightheadedness, dizziness r/t vertigo, diagnosed 2013 Behavioral/Psych: Mood is stable, no new changes (+) seems overwhelmed by her illnesses and overwhelmed with instructions for f/u All other systems were reviewed with the patient and are negative.  MEDICAL HISTORY:  Past Medical History:  Diagnosis Date  . Anemia   . Anxiety   . Arthritis    hands, knees, shoulder, back   . Asthma   . Hypoglycemia   . Lupus   . Migraine     SURGICAL HISTORY: Past Surgical History:  Procedure Laterality Date  . APPENDECTOMY    . CESAREAN SECTION    . IUD REMOVAL [name] 11/12/2014  . TUBAL LIGATION      I have reviewed the social history and family history with the patient and they are unchanged from previous note.  ALLERGIES:  is allergic to anesthetics, amide; benzocaine; mushroom extract complex; trazodone and nefazodone; amoxicillin; bee venom; benadryl [diphenhydramine]; effexor [venlafaxine]; lactose intolerance (gi); nortriptyline; nsaids; and other.  MEDICATIONS:  Current Outpatient Medications  Medication Sig Dispense Refill  . aluminum hydroxide-magnesium carbonate (GAVISCON) 95-358 MG/15ML SUSP Take 15 mLs by mouth as needed for indigestion or heartburn.    . loratadine (CLARITIN) 10 MG tablet Take 10 mg by mouth daily as needed  for allergies.    . naproxen (NAPROSYN) 500 MG tablet Take 500 mg by mouth 2 (two) times daily as needed (pain, migraines).     .  ondansetron (ZOFRAN ODT) 4 MG disintegrating tablet Take 1 tablet (4 mg total) by mouth every 8 (eight) hours as needed. (Patient taking differently: Take 4 mg by mouth every 8 (eight) hours as needed for nausea or vomiting. ) 10 tablet 0  . ferrous sulfate 325 (65 FE) MG tablet Take 1 tablet (325 mg total) by mouth daily with breakfast. (Patient not taking: Reported on 07/26/2017) 60 tablet 5   No current facility-administered medications for this visit.     PHYSICAL EXAMINATION: ECOG PERFORMANCE STATUS: 1 - Symptomatic but completely ambulatory  Vitals:   07/29/17 0818  BP: 122/61  Pulse: 60  Resp: 16  Temp: 97.7 F (36.5 C)  SpO2: 100%   Filed Weights   07/29/17 0818  Weight: 150 lb (68 kg)    GENERAL:alert, no distress and comfortable SKIN: skin color, texture, turgor are normal, no rashes or significant lesions EYES: normal, Conjunctiva are pink and non-injected, sclera clear OROPHARYNX:no exudate, no erythema; lips and buccal mucosa are normal (+) glossal hyperpigmentation  LYMPH:  no palpable cervical, supraclavicular, or axillary lymphadenopathy LUNGS: clear to auscultation bilaterally with normal breathing effort HEART: regular rate & rhythm and no murmurs and no lower extremity edema ABDOMEN:abdomen soft and normal bowel sounds. (+) mild tenderness on palpation to RUQ  Musculoskeletal:no cyanosis of digits and no clubbing  NEURO: alert & oriented x 3 with fluent speech, no focal motor/sensory deficits  LABORATORY DATA:  I have reviewed the data as listed CBC Latest Ref Rng & Units 07/29/2017 07/17/2017 07/01/2017  WBC 3.9 - 10.3 K/uL 6.1 5.8 9.7  Hemoglobin 12.0 - 15.0 g/dL - - -  Hematocrit 48.5 - 46.6 % 38.7 38.0 36.4  Platelets 145 - 400 K/uL 241 309 346     CMP Latest Ref Rng & Units 06/23/2017 05/30/2017 08/09/2016  Glucose 65 - 99 mg/dL 96 81 86  BUN 6 - 20 mg/dL 13 11 14   Creatinine 0.44 - 1.00 mg/dL 4.62 7.03  Sodium 135 - 145 mmol/L 135 142 136    Potassium 3.5 - 5.1 mmol/L 3.7 4.3 3.5  Chloride 101 - 111 mmol/L 107 105 107  CO2 22 - 32 mmol/L 22 20 22   Calcium 8.9 - 10.3 mg/dL 5.00) ) 9.3(G)  Total Protein 6.5 - 8.1 g/dL 7.4 7.0 7.6  Total Bilirubin 0.3 - 1.2 mg/dL 1.8(E) 9.9(B 0.3  Alkaline Phos 38 - 126 U/L 88 81 77  AST 15 - 41 U/L 19 18 17   ALT 14 - 54 U/L 14 11 14    Iron studies 11/2016: Ferritin 3 Serum iron 17 Percent saturation 3 TIBC 606  07/01/2017: Ferritin 13 Serum iron 17 Percent saturation 3 [id number]  07/17/17: Ferritin 432 Serum iron 179 Percent saturation 43 TIBC 416  07/29/17:  Ferritin 506 Serum iron 184 Percent saturation 55 TIBC 334  RADIOGRAPHIC STUDIES: I have personally reviewed the radiological images as listed and agreed with the findings in the report. No results found.   ASSESSMENT & PLAN: Danielle Holden is a 47 year old female with history of iron deficiency anemia, colitis, IBS, lupus, and RA.   1.  Anemia, iron deficiency due to chronic blood loss  -S/p IV Feraheme on 2/27 and 3/6, she tolerated well. Iron studies responded well. Hgb normalized, Hgb 11.8. Her fatigue has improved some. -Continues to have heavy  menses, Ongoing discussion with GYN for partial hysterectomy, potentially in the summer. She will f/u with Dr. Clearance Coots after cholecystectomy for further discussion  -She is not on oral iron due to constipation; discussed she may need additional IV Feraheme in the future as long as she has heavy menses. Will follow closely, return in 1 month for lab and f/u -In the meantime I encouraged her to obtain PCP; I suggested [facility]; she will continue to try  2. Colitis, IBS -Will try to obtain colonoscopy report, she cannot recall facility or provider but did give a phone number.  -I encouraged her to avoid NSAIDs due to increased bleeding risk. She agrees.   3. Lupus, RA -She did not f/u with Dr. Sharmon Revere at [facility] as instructed last month; I  looked up the number online during today's visit and gave it to her to call and f/u.  4. Cholecystitis, ventral hernia  -Cholecystectomy and possible hernia repair per Dr. Cliffton Asters pending 3/27  5. Vertigo, diagnosed 2013 -She has intermittent dizziness; I suggest she try over the counter meclizine PRN  PLAN -Labs reviewed; anemia resolved, iron studies normal -Lab and f/u in 1 month -Our office will attempt to get colonoscopy report from phone number provided by pt -Patient will est with PCP and call rheum for f/u  -Cholecystectomy per Dr. Cliffton Asters 3/27  All questions were answered. The patient knows to call the clinic with any problems, questions or concerns. No barriers to learning was detected.     Pollyann Samples, NP 07/29/17   Addendum: After the encounter I received and reviewed outside colonoscopy and pathology report from [facility] in [place] per Dr. Marissa Calamity. Patient underwent colonoscopy on 11/22/16 that was notable for segmental area of mildly congested, erythematous mucosa in the colon and rectum. Pathology positive for mildly active colitis in the right and left colon with features of chronicity. Otherwise unremarkable exam.   Santiago Glad, NP 08/06/17

## 2017-07-29 ENCOUNTER — Inpatient Hospital Stay: Payer: Medicaid Other

## 2017-07-29 ENCOUNTER — Inpatient Hospital Stay (HOSPITAL_BASED_OUTPATIENT_CLINIC_OR_DEPARTMENT_OTHER): Payer: Medicaid Other | Admitting: Nurse Practitioner

## 2017-07-29 ENCOUNTER — Encounter: Payer: Self-pay | Admitting: Nurse Practitioner

## 2017-07-29 VITALS — BP 122/61 | HR 60 | Temp 97.7°F | Resp 16 | Ht 63.0 in | Wt 150.0 lb

## 2017-07-29 DIAGNOSIS — D500 Iron deficiency anemia secondary to blood loss (chronic): Secondary | ICD-10-CM

## 2017-07-29 DIAGNOSIS — N920 Excessive and frequent menstruation with regular cycle: Secondary | ICD-10-CM

## 2017-07-29 LAB — IRON AND TIBC
IRON: 184 ug/dL — AB (ref 41–142)
Saturation Ratios: 55 % (ref 21–57)
TIBC: 334 ug/dL (ref 236–444)
UIBC: 149 ug/dL

## 2017-07-29 LAB — CBC WITH DIFFERENTIAL (CANCER CENTER ONLY)
Basophils Absolute: 0.1 10*3/uL (ref 0.0–0.1)
Basophils Relative: 1 %
EOS ABS: 0.3 10*3/uL (ref 0.0–0.5)
EOS PCT: 4 %
HCT: 38.7 % (ref 34.8–46.6)
Hemoglobin: 11.8 g/dL (ref 11.6–15.9)
LYMPHS PCT: 25 %
Lymphs Abs: 1.5 10*3/uL (ref 0.9–3.3)
MCH: 23.3 pg — ABNORMAL LOW (ref 25.1–34.0)
MCHC: 30.5 g/dL — ABNORMAL LOW (ref 31.5–36.0)
MCV: 76.5 fL — ABNORMAL LOW (ref 79.5–101.0)
Monocytes Absolute: 0.5 10*3/uL (ref 0.1–0.9)
Monocytes Relative: 8 %
Neutro Abs: 3.8 10*3/uL (ref 1.5–6.5)
Neutrophils Relative %: 62 %
PLATELETS: 241 10*3/uL (ref 145–400)
RBC: 5.06 MIL/uL (ref 3.70–5.45)
RDW: 28.9 % — ABNORMAL HIGH (ref 11.2–14.5)
WBC: 6.1 10*3/uL (ref 3.9–10.3)

## 2017-07-29 LAB — RETICULOCYTES
RBC.: 5.06 MIL/uL (ref 3.70–5.45)
RETIC COUNT ABSOLUTE: 40.5 10*3/uL (ref 33.7–90.7)
RETIC CT PCT: 0.8 % (ref 0.7–2.1)

## 2017-07-29 LAB — FERRITIN: Ferritin: 506 ng/mL — ABNORMAL HIGH (ref 9–269)

## 2017-08-01 ENCOUNTER — Ambulatory Visit: Payer: Self-pay | Admitting: Surgery

## 2017-08-01 ENCOUNTER — Encounter (HOSPITAL_COMMUNITY): Payer: Self-pay

## 2017-08-01 NOTE — Pre-Procedure Instructions (Signed)
The following are in epic: Last office visit note Azucena Cecil, NP 07/29/2017 CBC/diff 07/29/2017

## 2017-08-01 NOTE — H&P (Signed)
CC: Intermittent sharp RUQ pain; possible hernia as well HPI: Ms. Danielle Holden is a pleasant 47yoF with hx of asthma, anxiety and depression here today as a referral from her PCP for possible ventral hernia.  She describes a history of intermittent crampy/sharp right upper quadrant pain which is made worse with fatty foods.  This has been going on for many months if not years and she has altered her diet due to this.  Nothing makes the pain better aside from time.  The pain occasionally radiates to the mid epigastric region.  She has associated nausea with the pain but no emesis.  She denies history of fever/chills.  She does note a history of "colitis" - and recently spent extended period time in [place] where she was evaluated for this.  She had bloody diarrhea and workup there was significant for proctitis/distal colitis and she was referred to GI.  She states she had a colonoscopy done that demonstrated "colitis" and was also told she had IBS. PMH: Rheumatoid arthritis, lupus, chronic anemia, vertigo, migraines PSH: Laparoscopic appendectomy-2009; tubal ligation 1993; C-section via Pfannenstiel 1989 FHx: Denies FHx of malignancy - Father deceased from suicide Social: Denies use of tobacco/EtOH/drugs ROS: A comprehensive 10 system review of systems was completed with the patient and pertinent findings as noted above.  The patient is a 47 year old female.   Past Surgical History Danielle Holden, CMA; 06/10/2017 10:40 AM) Appendectomy   Cesarean Section - 1    Diagnostic Studies History Danielle Holden, CMA; 06/10/2017 10:40 AM) Colonoscopy   within last year Mammogram   within last year Pap Smear   1-5 years ago  Allergies Danielle Holden, CMA; 06/10/2017 10:42 AM) Anesthetics, Amide   Trazodone and Nefazodone   Benadryl *ANTIHISTAMINES*   Effexor *ANTIDEPRESSANTS*   Nortriptyline HCl *ANTIDEPRESSANTS*    Medication History (Danielle Holden, CMA; 06/10/2017 10:43 AM) Oxycodone-Acetaminophen   (10-325MG  Tablet, Oral) Active. Naproxen  (500MG  Tablet, Oral) Active. Fluticasone Propionate  (50MCG/ACT Suspension, Nasal) Active. Ferrous Sulfate  (325 (65 Fe)MG Tablet, Oral) Active. Medications Reconciled   Social History Danielle Holden, CMA; 06/10/2017 10:40 AM) Caffeine use   Coffee, Tea. No alcohol use   No drug use   Tobacco use   Never smoker.  Family History Danielle Holden, CMA; 06/10/2017 10:40 AM) Alcohol Abuse   Father. Arthritis   Father. Depression   Father, Mother. Diabetes Mellitus   Family Members In General. Ischemic Bowel Disease   Father. Migraine Headache   Father, Mother.  Pregnancy / Birth History Danielle Holden, CMA; 06/10/2017 10:40 AM) Age at menarche   7 years. Contraceptive History   Intrauterine device, Oral contraceptives. Gravida   4 Irregular periods   Maternal age   58-20 Para   3  Other Problems Danielle Holden, CMA; 06/10/2017 10:40 AM) Anxiety Disorder   Arthritis   Cholelithiasis   Hemorrhoids   Migraine Headache   Other disease, cancer, significant illness      Review of Systems (Danielle Holden CMA; 06/10/2017 10:40 AM) General Not Present- Appetite Loss, Chills, Fatigue, Fever, Night Sweats, Weight Gain and Weight Loss. Skin Not Present- Change in Wart/Mole, Dryness, Hives, Jaundice, New Lesions, Non-Healing Wounds, Rash and Ulcer. HEENT Present- Seasonal Allergies and Sinus Pain. Not Present- Earache, Hearing Loss, Hoarseness, Nose Bleed, Oral Ulcers, Ringing in the Ears, Sore Throat, Visual Disturbances, Wears glasses/contact lenses and Yellow Eyes. Respiratory Not Present- Bloody sputum, Chronic Cough, Difficulty Breathing, Snoring and Wheezing. Cardiovascular Not Present- Chest Pain, Difficulty Breathing Lying Down, Leg Cramps,  Palpitations, Rapid Heart Rate, Shortness of Breath and Swelling of Extremities. Gastrointestinal Present- Excessive gas and Hemorrhoids. Not Present- Abdominal Pain, Bloating, Bloody Stool, Change in Bowel  Habits, Chronic diarrhea, Constipation, Difficulty Swallowing, Gets full quickly at meals, Indigestion, Nausea, Rectal Pain and Vomiting. Musculoskeletal Present- Joint Pain and Joint Stiffness. Not Present- Back Pain, Muscle Pain, Muscle Weakness and Swelling of Extremities. Neurological Present- Headaches. Not Present- Decreased Memory, Fainting, Numbness, Seizures, Tingling, Tremor, Trouble walking and Weakness. Psychiatric Present- Anxiety. Not Present- Bipolar, Change in Sleep Pattern, Depression, Fearful and Frequent crying. Endocrine Not Present- Cold Intolerance, Excessive Hunger, Hair Changes, Heat Intolerance, Hot flashes and New Diabetes. Hematology Not Present- Blood Thinners, Easy Bruising, Excessive bleeding, Gland problems, HIV and Persistent Infections.  Vitals (Danielle Holden CMA; 06/10/2017 10:41 AM) 06/10/2017 10:40 AM Weight: 153.8 lb   Height: 63 in  Body Surface Area: 1.73 m   Body Mass Index: 27.24 kg/m   Temp.: 98.4 F (Oral)    Pulse: 90 (Regular)    BP: 130/80 (Sitting, Left Arm, Standard)       Physical Exam Danielle Holden M. Danielle Mendel MD; 06/10/2017 11:15 AM) The physical exam findings are as follows: Note: Constitutional: No acute distress; conversant; no deformities Eyes: Moist conjunctiva; no lid lag; anicteric sclerae; pupils equal round and reactive to light Neck: Trachea midline; no palpable thyromegaly Lungs: Normal respiratory effort; no tactile fremitus CV: Regular rate and rhythm; no palpable thrill; no pitting edema GI: Abdomen soft, nontender, nondistended; no palpable hepatosplenomegaly; negative Murphy's sign. ?Supraumbilical defect/hernia - firmess in this region. No overlying skin changes. MSK: Normal gait; no clubbing/cyanosis Psychiatric: Appropriate affect; alert and oriented 3 Lymphatic: No palpable cervical or axillary lymphadenopathy    Assessment & Plan Danielle Holden M. Danielle Kernodle MD; 06/10/2017 11:21 AM) VENTRAL HERNIA (K43.9) Impression: Ms.  Danielle Holden is a pleasant 47yoF with hx of RA, Lupus, chronic anemia, "colitis" here today with likely underlying symptomatic cholelithiasis and possible supraumbilical ventral hernia -Will plan to obtain CT a/p with contrast for further evaluation of the abdominal wall -Return to office following CT scan for further evaluation and coordination/planning of care -Will likely plan laparoscopic versus open cholecystectomy for her symptomatic cholelithiasis - RUQ [place] 08/09/16 showed stones and sludge; her LFTs have been normal as has her WBC; she has no abnormal lipases levels in our system either. IF she has a ventral hernia, we will likely plan primary repair of that time of her cholecystectomy and avoid the placement of mesh -The anatomy and physiology of the HPB tract was discussed at length with the patient. The pathophysiology of ventral hernias and gallbladder disease was discussed at length with associated pictures and illustrations. The potential planned procedures for dressing hernias and gallbladder disease was discussed at length with pictures and diagrams. Minimally invasive as well as open techniques were discussed. -The planned procedure, material risks (including, but not limited to, pain, bleeding, infection, scarring, need for blood transfusion, damage to surrounding structures- blood vessels/nerves/viscus/organs, damage to bile duct, bile leak, need for additional procedures, hernia & recurrence of hernia, pancreatitis, pneumonia, heart attack, stroke, death) benefits and alternatives to surgery were discussed at length. I noted a good probability that the procedure would help improve their symptoms -We will discuss again with her at follow-up visit following her CT scan Current Plans CT ABDOMEN PELVIS W CON (60109) (?Ventral hernia - supraumbilical; evaluate for any other defects that may need to be addressed at time of surgery on her gallbladder) SYMPTOMATIC CHOLELITHIASIS (K80.20)  Signed  electronically by  Andria Meuse, MD (06/10/2017 11:21 AM)

## 2017-08-01 NOTE — Patient Instructions (Addendum)
Your procedure is scheduled on: Wednesday, August 07, 2017   Surgery Time:  8:30AM-11:00AM   Report to [facility] Main  Entrance    Report to admitting at 6:30 AM   Call this number if you have problems the morning of surgery [phone]   Do not eat food or drink liquids :After Midnight.   Do NOT smoke after Midnight   Complete one Ensure drink the morning of surgery just before you leave home to come to hospital for surgery.   Drink 2 Ensure drinks the night before surgery.  Complete one Ensure drink the morning of surgery 3 hours prior to scheduled surgery.   Take these medicines the morning of surgery with A SIP OF WATER: None                               You may not have any metal on your body including hair pins, jewelry, and body piercings             Do not wear make-up, lotions, powders, perfumes/cologne, or deodorant             Do not wear nail polish.  Do not shave  48 hours prior to surgery.               Do not bring valuables to the hospital. [facility] IS NOT             RESPONSIBLE   FOR VALUABLES.   Contacts, dentures or bridgework may not be worn into surgery.   Patients discharged the day of surgery will not be allowed to drive home.   Name and phone number of your driver:  Koleen Nimrod [phone]   Special Instructions: Bring a copy of your healthcare power of attorney and living will documents         the day of surgery if you haven't scanned them in before.              Please read over the following fact sheets you were given:  [facility] - Preparing for Surgery Before surgery, you can play an important role.  Because skin is not sterile, your skin needs to be as free of germs as possible.  You can reduce the number of germs on your skin by washing with CHG (chlorahexidine gluconate) soap before surgery.  CHG is an antiseptic cleaner which kills germs and bonds with the skin to continue killing germs even after washing. Please DO NOT use  if you have an allergy to CHG or antibacterial soaps.  If your skin becomes reddened/irritated stop using the CHG and inform your nurse when you arrive at Short Stay. Do not shave (including legs and underarms) for at least 48 hours prior to the first CHG shower.  You may shave your face/neck.  Please follow these instructions carefully:  1.  Shower with CHG Soap the night before surgery and the  morning of surgery.  2.  If you choose to wash your hair, wash your hair first as usual with your normal  shampoo.  3.  After you shampoo, rinse your hair and body thoroughly to remove the shampoo.                             4.  Use CHG as you would any other liquid soap.  You can apply chg directly  to the skin and wash.  Gently with a scrungie or clean washcloth.  5.  Apply the CHG Soap to your body ONLY FROM THE NECK DOWN.   Do   not use on face/ open                           Wound or open sores. Avoid contact with eyes, ears mouth and   genitals (private parts).                       Wash face,  Genitals (private parts) with your normal soap.             6.  Wash thoroughly, paying special attention to the area where your    surgery  will be performed.  7.  Thoroughly rinse your body with warm water from the neck down.  8.  DO NOT shower/wash with your normal soap after using and rinsing off the CHG Soap.                9.  Pat yourself dry with a clean towel.            10.  Wear clean pajamas.            11.  Place clean sheets on your bed the night of your first shower and do not  sleep with pets. Day of Surgery : Do not apply any lotions/deodorants the morning of surgery.  Please wear clean clothes to the hospital/surgery center.  FAILURE TO FOLLOW THESE INSTRUCTIONS MAY RESULT IN THE CANCELLATION OF YOUR SURGERY  PATIENT SIGNATURE_________________________________  NURSE  SIGNATURE__________________________________  ________________________________________________________________________

## 2017-08-02 ENCOUNTER — Encounter (HOSPITAL_COMMUNITY): Payer: Self-pay

## 2017-08-02 ENCOUNTER — Encounter (HOSPITAL_COMMUNITY)
Admission: RE | Admit: 2017-08-02 | Discharge: 2017-08-02 | Disposition: A | Discharge status: Home / Self Care | Payer: Medicaid Other | Source: Ambulatory Visit | Attending: Surgery | Admitting: Surgery

## 2017-08-02 ENCOUNTER — Other Ambulatory Visit: Payer: Self-pay

## 2017-08-02 DIAGNOSIS — Z01812 Encounter for preprocedural laboratory examination: Secondary | ICD-10-CM | POA: Insufficient documentation

## 2017-08-02 DIAGNOSIS — K8020 Calculus of gallbladder without cholecystitis without obstruction: Secondary | ICD-10-CM | POA: Insufficient documentation

## 2017-08-02 DIAGNOSIS — Z01810 Encounter for preprocedural cardiovascular examination: Secondary | ICD-10-CM | POA: Diagnosis not present

## 2017-08-02 HISTORY — DX: Rheumatoid arthritis, unspecified: M06.9

## 2017-08-02 HISTORY — DX: Irritable bowel syndrome, unspecified: K58.9

## 2017-08-02 HISTORY — DX: Noninfective gastroenteritis and colitis, unspecified: K52.9

## 2017-08-02 HISTORY — DX: Personal history of urinary calculi: Z87.442

## 2017-08-02 HISTORY — DX: Iron deficiency anemia, unspecified: D50.9

## 2017-08-02 HISTORY — DX: Paresthesia of skin: R20.2

## 2017-08-02 HISTORY — DX: Calculus of gallbladder without cholecystitis without obstruction: K80.20

## 2017-08-02 HISTORY — DX: Personal history of other infectious and parasitic diseases: Z86.19

## 2017-08-02 HISTORY — DX: Other allergic rhinitis: J30.89

## 2017-08-02 HISTORY — DX: Adverse effect of unspecified anesthetic, initial encounter: T41.45XA

## 2017-08-02 HISTORY — DX: Ventral hernia without obstruction or gangrene: K43.9

## 2017-08-02 HISTORY — DX: Other fatigue: R53.83

## 2017-08-02 HISTORY — DX: Dizziness and giddiness: R42

## 2017-08-02 HISTORY — DX: Gastro-esophageal reflux disease without esophagitis: K21.9

## 2017-08-02 HISTORY — DX: Abnormal uterine and vaginal bleeding, unspecified: N93.9

## 2017-08-02 HISTORY — DX: Anesthesia of skin: R20.0

## 2017-08-02 HISTORY — DX: Bradycardia, unspecified: R00.1

## 2017-08-02 LAB — COMPREHENSIVE METABOLIC PANEL
ALT: 15 U/L (ref 14–54)
ANION GAP: 9 (ref 5–15)
AST: 19 U/L (ref 15–41)
Albumin: 4.2 g/dL (ref 3.5–5.0)
Alkaline Phosphatase: 74 U/L (ref 38–126)
BUN: 11 mg/dL (ref 6–20)
CHLORIDE: 105 mmol/L (ref 101–111)
CO2: 26 mmol/L (ref 22–32)
Calcium: 9.2 mg/dL (ref 8.9–10.3)
Creatinine, Ser: 0.7 mg/dL (ref 0.44–1.00)
GFR calc non Af Amer: >60 mL/min (ref >60)
Glucose, Bld: 86 mg/dL (ref 65–99)
POTASSIUM: 3.8 mmol/L (ref 3.5–5.1)
SODIUM: 140 mmol/L (ref 135–145)
Total Bilirubin: 0.3 mg/dL (ref 0.3–1.2)
Total Protein: 7.6 g/dL (ref 6.5–8.1)

## 2017-08-02 LAB — PROTIME-INR
INR: 1.13
Prothrombin Time: 14.4 seconds (ref 11.4–15.2)

## 2017-08-02 LAB — APTT: APTT: 26 s (ref 24–36)

## 2017-08-02 NOTE — Pre-Procedure Instructions (Signed)
Dr. Aleene Davidson  made aware of anesthesia complication during tubal ligation in 1993, no new orders received at this time.

## 2017-08-06 MED ORDER — DEXTROSE 5 % IV SOLN
5.0000 mg/kg | INTRAVENOUS | Status: AC
  Administered 2017-08-07: 290 mg via INTRAVENOUS
  Filled 2017-08-06: qty 7.25

## 2017-08-07 ENCOUNTER — Ambulatory Visit (HOSPITAL_COMMUNITY): Payer: Medicaid Other

## 2017-08-07 ENCOUNTER — Ambulatory Visit (HOSPITAL_COMMUNITY)
Admission: RE | Admit: 2017-08-07 | Discharge: 2017-08-07 | Disposition: A | Discharge status: Home / Self Care | Payer: Medicaid Other | Source: Ambulatory Visit | Attending: Surgery | Admitting: Surgery

## 2017-08-07 ENCOUNTER — Encounter (HOSPITAL_COMMUNITY)
Admission: RE | Disposition: A | Discharge status: Home / Self Care | Payer: Self-pay | Source: Ambulatory Visit | Attending: Surgery

## 2017-08-07 ENCOUNTER — Encounter (HOSPITAL_COMMUNITY): Payer: Self-pay

## 2017-08-07 DIAGNOSIS — Z884 Allergy status to anesthetic agent status: Secondary | ICD-10-CM | POA: Diagnosis not present

## 2017-08-07 DIAGNOSIS — M069 Rheumatoid arthritis, unspecified: Secondary | ICD-10-CM | POA: Diagnosis not present

## 2017-08-07 DIAGNOSIS — F329 Major depressive disorder, single episode, unspecified: Secondary | ICD-10-CM | POA: Diagnosis not present

## 2017-08-07 DIAGNOSIS — J45909 Unspecified asthma, uncomplicated: Secondary | ICD-10-CM | POA: Diagnosis not present

## 2017-08-07 DIAGNOSIS — Z888 Allergy status to other drugs, medicaments and biological substances status: Secondary | ICD-10-CM | POA: Insufficient documentation

## 2017-08-07 DIAGNOSIS — K8010 Calculus of gallbladder with chronic cholecystitis without obstruction: Secondary | ICD-10-CM | POA: Insufficient documentation

## 2017-08-07 DIAGNOSIS — Z79899 Other long term (current) drug therapy: Secondary | ICD-10-CM | POA: Diagnosis not present

## 2017-08-07 DIAGNOSIS — K429 Umbilical hernia without obstruction or gangrene: Secondary | ICD-10-CM | POA: Insufficient documentation

## 2017-08-07 DIAGNOSIS — M329 Systemic lupus erythematosus, unspecified: Secondary | ICD-10-CM | POA: Diagnosis not present

## 2017-08-07 DIAGNOSIS — F419 Anxiety disorder, unspecified: Secondary | ICD-10-CM | POA: Insufficient documentation

## 2017-08-07 DIAGNOSIS — K589 Irritable bowel syndrome without diarrhea: Secondary | ICD-10-CM | POA: Insufficient documentation

## 2017-08-07 HISTORY — PX: CHOLECYSTECTOMY: SHX55

## 2017-08-07 LAB — PREGNANCY, URINE: Preg Test, Ur: NEGATIVE

## 2017-08-07 SURGERY — LAPAROSCOPIC CHOLECYSTECTOMY
Anesthesia: General | Site: Abdomen

## 2017-08-07 MED ORDER — TRAMADOL HCL 50 MG PO TABS: 50.0000 mg | ORAL_TABLET | Freq: Four times a day (QID) | ORAL | 0 refills | Status: DC | PRN

## 2017-08-07 MED ORDER — FENTANYL CITRATE (PF) 100 MCG/2ML IJ SOLN
INTRAMUSCULAR | Status: DC | PRN
  Administered 2017-08-07: 25 ug via INTRAVENOUS
  Administered 2017-08-07: 100 ug via INTRAVENOUS
  Administered 2017-08-07 (×2): 50 ug via INTRAVENOUS
  Administered 2017-08-07: 25 ug via INTRAVENOUS

## 2017-08-07 MED ORDER — CHLORHEXIDINE GLUCONATE CLOTH 2 % EX PADS: 6.0000 | MEDICATED_PAD | Freq: Once | CUTANEOUS | Status: DC

## 2017-08-07 MED ORDER — PROPOFOL 10 MG/ML IV BOLUS
INTRAVENOUS | Status: AC
Start: 2017-08-07 — End: ?
  Filled 2017-08-07: qty 20

## 2017-08-07 MED ORDER — PROPOFOL 10 MG/ML IV BOLUS
INTRAVENOUS | Status: AC
  Filled 2017-08-07: qty 20

## 2017-08-07 MED ORDER — CLINDAMYCIN PHOSPHATE 900 MG/50ML IV SOLN
900.0000 mg | INTRAVENOUS | Status: AC
  Administered 2017-08-07: 900 mg via INTRAVENOUS
  Filled 2017-08-07: qty 50

## 2017-08-07 MED ORDER — ROCURONIUM BROMIDE 10 MG/ML (PF) SYRINGE
PREFILLED_SYRINGE | INTRAVENOUS | Status: DC | PRN
  Administered 2017-08-07: 50 mg via INTRAVENOUS
  Administered 2017-08-07: 5 mg via INTRAVENOUS
  Administered 2017-08-07: 10 mg via INTRAVENOUS

## 2017-08-07 MED ORDER — ONDANSETRON HCL 4 MG/2ML IJ SOLN
INTRAMUSCULAR | Status: AC
  Filled 2017-08-07: qty 2

## 2017-08-07 MED ORDER — HYDROMORPHONE HCL 1 MG/ML IJ SOLN
0.2500 mg | INTRAMUSCULAR | Status: DC | PRN
  Administered 2017-08-07 (×2): 0.5 mg via INTRAVENOUS

## 2017-08-07 MED ORDER — FENTANYL CITRATE (PF) 250 MCG/5ML IJ SOLN
INTRAMUSCULAR | Status: AC
Start: 2017-08-07 — End: ?
  Filled 2017-08-07: qty 5

## 2017-08-07 MED ORDER — LACTATED RINGERS IV SOLN
INTRAVENOUS | Status: DC
  Administered 2017-08-07: 07:00:00 via INTRAVENOUS

## 2017-08-07 MED ORDER — 0.9 % SODIUM CHLORIDE (POUR BTL) OPTIME
TOPICAL | Status: DC | PRN
  Administered 2017-08-07: 1000 mL

## 2017-08-07 MED ORDER — PROPOFOL 500 MG/50ML IV EMUL
INTRAVENOUS | Status: DC | PRN
  Administered 2017-08-07: 130 ug/kg/min via INTRAVENOUS

## 2017-08-07 MED ORDER — SUGAMMADEX SODIUM 200 MG/2ML IV SOLN
INTRAVENOUS | Status: DC | PRN
  Administered 2017-08-07: 140 mg via INTRAVENOUS

## 2017-08-07 MED ORDER — LACTATED RINGERS IR SOLN
Status: DC | PRN
  Administered 2017-08-07: 1000 mL

## 2017-08-07 MED ORDER — ONDANSETRON HCL 4 MG/2ML IJ SOLN
INTRAMUSCULAR | Status: DC | PRN
  Administered 2017-08-07: 4 mg via INTRAVENOUS

## 2017-08-07 MED ORDER — PHENYLEPHRINE 40 MCG/ML (10ML) SYRINGE FOR IV PUSH (FOR BLOOD PRESSURE SUPPORT)
PREFILLED_SYRINGE | INTRAVENOUS | Status: AC
  Filled 2017-08-07: qty 10

## 2017-08-07 MED ORDER — SUGAMMADEX SODIUM 200 MG/2ML IV SOLN
INTRAVENOUS | Status: AC
  Filled 2017-08-07: qty 2

## 2017-08-07 MED ORDER — LACTATED RINGERS IV SOLN: INTRAVENOUS | Status: DC

## 2017-08-07 MED ORDER — DEXAMETHASONE SODIUM PHOSPHATE 10 MG/ML IJ SOLN
INTRAMUSCULAR | Status: AC
  Filled 2017-08-07: qty 1

## 2017-08-07 MED ORDER — HEPARIN SODIUM (PORCINE) 5000 UNIT/ML IJ SOLN
5000.0000 [IU] | Freq: Once | INTRAMUSCULAR | Status: AC
  Administered 2017-08-07: 5000 [IU] via SUBCUTANEOUS
  Filled 2017-08-07: qty 1

## 2017-08-07 MED ORDER — SCOPOLAMINE 1 MG/3DAYS TD PT72
MEDICATED_PATCH | TRANSDERMAL | Status: DC | PRN
  Administered 2017-08-07: 1 via TRANSDERMAL

## 2017-08-07 MED ORDER — PROPOFOL 10 MG/ML IV BOLUS
INTRAVENOUS | Status: DC | PRN
  Administered 2017-08-07: 200 mg via INTRAVENOUS

## 2017-08-07 MED ORDER — ACETAMINOPHEN 500 MG PO TABS
1000.0000 mg | ORAL_TABLET | ORAL | Status: AC
  Administered 2017-08-07: 1000 mg via ORAL
  Filled 2017-08-07: qty 2

## 2017-08-07 MED ORDER — GABAPENTIN 300 MG PO CAPS
300.0000 mg | ORAL_CAPSULE | ORAL | Status: AC
  Administered 2017-08-07: 300 mg via ORAL
  Filled 2017-08-07: qty 1

## 2017-08-07 MED ORDER — EPHEDRINE 5 MG/ML INJ
INTRAVENOUS | Status: AC
  Filled 2017-08-07: qty 10

## 2017-08-07 MED ORDER — MEPERIDINE HCL 50 MG/ML IJ SOLN: 6.2500 mg | INTRAMUSCULAR | Status: DC | PRN

## 2017-08-07 MED ORDER — PROPOFOL 10 MG/ML IV BOLUS
INTRAVENOUS | Status: AC
  Filled 2017-08-07: qty 40

## 2017-08-07 MED ORDER — MIDAZOLAM HCL 5 MG/5ML IJ SOLN
INTRAMUSCULAR | Status: DC | PRN
  Administered 2017-08-07: 2 mg via INTRAVENOUS

## 2017-08-07 MED ORDER — EPHEDRINE SULFATE-NACL 50-0.9 MG/10ML-% IV SOSY
PREFILLED_SYRINGE | INTRAVENOUS | Status: DC | PRN
  Administered 2017-08-07: 5 mg via INTRAVENOUS

## 2017-08-07 MED ORDER — PHENYLEPHRINE 40 MCG/ML (10ML) SYRINGE FOR IV PUSH (FOR BLOOD PRESSURE SUPPORT)
PREFILLED_SYRINGE | INTRAVENOUS | Status: DC | PRN
  Administered 2017-08-07: 40 ug via INTRAVENOUS

## 2017-08-07 MED ORDER — MIDAZOLAM HCL 2 MG/2ML IJ SOLN
INTRAMUSCULAR | Status: AC
  Filled 2017-08-07: qty 2

## 2017-08-07 MED ORDER — ROCURONIUM BROMIDE 10 MG/ML (PF) SYRINGE
PREFILLED_SYRINGE | INTRAVENOUS | Status: AC
  Filled 2017-08-07: qty 5

## 2017-08-07 MED ORDER — SCOPOLAMINE 1 MG/3DAYS TD PT72
MEDICATED_PATCH | TRANSDERMAL | Status: AC
  Filled 2017-08-07: qty 1

## 2017-08-07 MED ORDER — PROMETHAZINE HCL 25 MG/ML IJ SOLN
6.2500 mg | INTRAMUSCULAR | Status: DC | PRN
  Administered 2017-08-07: 6.25 mg via INTRAVENOUS

## 2017-08-07 MED ORDER — PROMETHAZINE HCL 25 MG/ML IJ SOLN
INTRAMUSCULAR | Status: AC
  Filled 2017-08-07: qty 1

## 2017-08-07 MED ORDER — HYDROMORPHONE HCL 1 MG/ML IJ SOLN
INTRAMUSCULAR | Status: AC
  Filled 2017-08-07: qty 1

## 2017-08-07 MED ORDER — DEXAMETHASONE SODIUM PHOSPHATE 10 MG/ML IJ SOLN
INTRAMUSCULAR | Status: DC | PRN
  Administered 2017-08-07: 10 mg via INTRAVENOUS

## 2017-08-07 SURGICAL SUPPLY — 39 items
APPLIER CLIP 5 13 M/L LIGAMAX5 (MISCELLANEOUS) ×3
APPLIER CLIP ROT 10 11.4 M/L (STAPLE)
CABLE HIGH FREQUENCY MONO STRZ (ELECTRODE) ×3 IMPLANT
CHLORAPREP W/TINT 26ML (MISCELLANEOUS) ×3 IMPLANT
CLIP APPLIE 5 13 M/L LIGAMAX5 (MISCELLANEOUS) ×2 IMPLANT
CLIP APPLIE ROT 10 11.4 M/L (STAPLE) IMPLANT
COVER SURGICAL LIGHT HANDLE (MISCELLANEOUS) ×3 IMPLANT
DECANTER SPIKE VIAL GLASS SM (MISCELLANEOUS) ×3 IMPLANT
DERMABOND ADVANCED (GAUZE/BANDAGES/DRESSINGS) ×1
DERMABOND ADVANCED .7 DNX12 (GAUZE/BANDAGES/DRESSINGS) ×2 IMPLANT
DISSECTOR BLUNT TIP ENDO 5MM (MISCELLANEOUS) IMPLANT
ELECT L-HOOK LAP 45CM DISP (ELECTROSURGICAL) ×3
ELECT REM PT RETURN 15FT ADLT (MISCELLANEOUS) ×3 IMPLANT
ELECTRODE L-HOOK LAP 45CM DISP (ELECTROSURGICAL) ×2 IMPLANT
GLOVE BIO SURGEON STRL SZ7.5 (GLOVE) ×3 IMPLANT
GLOVE BIOGEL PI IND STRL 7.0 (GLOVE) ×12 IMPLANT
GLOVE BIOGEL PI INDICATOR 7.0 (GLOVE) ×6
GLOVE INDICATOR 8.0 STRL GRN (GLOVE) ×3 IMPLANT
GOWN STRL REUS W/ TWL LRG LVL3 (GOWN DISPOSABLE) ×4 IMPLANT
GOWN STRL REUS W/TWL LRG LVL3 (GOWN DISPOSABLE) ×2
GOWN STRL REUS W/TWL XL LVL3 (GOWN DISPOSABLE) ×6 IMPLANT
GRASPER SUT TROCAR 14GX15 (MISCELLANEOUS) ×3 IMPLANT
HEMOSTAT SNOW SURGICEL 2X4 (HEMOSTASIS) IMPLANT
KIT BASIN OR (CUSTOM PROCEDURE TRAY) ×3 IMPLANT
NEEDLE INSUFFLATION 14GA 120MM (NEEDLE) ×3 IMPLANT
PENCIL HANDSWITCHING (ELECTRODE) ×3 IMPLANT
POUCH SPECIMEN RETRIEVAL 10MM (ENDOMECHANICALS) ×3 IMPLANT
SCISSORS LAP 5X35 DISP (ENDOMECHANICALS) ×3 IMPLANT
SET IRRIG TUBING LAPAROSCOPIC (IRRIGATION / IRRIGATOR) ×3 IMPLANT
SLEEVE ADV FIXATION 5X100MM (TROCAR) ×6 IMPLANT
SUT MNCRL AB 4-0 PS2 18 (SUTURE) ×3 IMPLANT
SYR 10ML ECCENTRIC (SYRINGE) ×3 IMPLANT
TOWEL OR 17X26 10 PK STRL BLUE (TOWEL DISPOSABLE) ×3 IMPLANT
TOWEL OR NON WOVEN STRL DISP B (DISPOSABLE) ×3 IMPLANT
TRAY LAPAROSCOPIC (CUSTOM PROCEDURE TRAY) ×3 IMPLANT
TROCAR ADV FIXATION 5X100MM (TROCAR) ×3 IMPLANT
TROCAR XCEL BLUNT TIP 100MML (ENDOMECHANICALS) ×3 IMPLANT
TROCAR XCEL NON-BLD 11X100MML (ENDOMECHANICALS) ×3 IMPLANT
TUBING INSUF HEATED (TUBING) ×3 IMPLANT

## 2017-08-07 NOTE — Anesthesia Postprocedure Evaluation (Signed)
Anesthesia Post Note  Patient: Danielle Holden  Procedure(s) Performed: LAPAROSCOPIC CHOLECYSTECTOMY, PRIMARY REPAIR OF UMBILICAL HERNIA ([name] Abdomen)     Patient location during evaluation: PACU Anesthesia Type: General Level of consciousness: awake and alert Pain management: pain level controlled Vital Signs Assessment: post-procedure vital signs reviewed and stable Respiratory status: spontaneous breathing, nonlabored ventilation, respiratory function stable and patient connected to nasal cannula oxygen Cardiovascular status: blood pressure returned to baseline and stable Postop Assessment: no apparent nausea or vomiting Anesthetic complications: no    Last Vitals:  Vitals:   08/07/17 1334 08/07/17 1336  BP:    Pulse: 71 85  Resp:    Temp:    SpO2: 98% 98%                  Shelton Silvas

## 2017-08-07 NOTE — Anesthesia Procedure Notes (Signed)
Procedure Name: Intubation Date/Time: 08/07/2017 8:48 AM Performed by: Victoriano Lain, CRNA Pre-anesthesia Checklist: Patient identified, Emergency Drugs available, Suction available, Patient being monitored and Timeout performed Patient Re-evaluated:Patient Re-evaluated prior to induction Oxygen Delivery Method: Circle system utilized Preoxygenation: Pre-oxygenation with 100% oxygen Induction Type: IV induction Ventilation: Mask ventilation without difficulty Laryngoscope Size: Mac and 3 Grade View: Grade I Tube type: Oral Tube size: 7.5 mm Number of attempts: 1 Airway Equipment and Method: Stylet Placement Confirmation: ETT inserted through vocal cords under direct vision,  positive ETCO2 and breath sounds checked- equal and bilateral Secured at: 21 cm Tube secured with: Tape Dental Injury: Teeth and Oropharynx as per pre-operative assessment

## 2017-08-07 NOTE — Transfer of Care (Signed)
Immediate Anesthesia Transfer of Care Note  Patient: Danielle Holden  Procedure(s) Performed: LAPAROSCOPIC CHOLECYSTECTOMY, PRIMARY REPAIR OF UMBILICAL HERNIA ([name] Abdomen)  Patient Location: PACU  Anesthesia Type:General  Level of Consciousness: awake, alert  and oriented  Airway & Oxygen Therapy: Patient Spontanous Breathing and Patient connected to face mask oxygen  Post-op Assessment: Report given to RN and Post -op Vital signs reviewed and stable  Post vital signs: Reviewed and stable  Last Vitals:  Vitals Value Taken Time  BP 97/56 08/07/2017 10:45 AM  Temp    Pulse 84 08/07/2017 10:48 AM  Resp 15 08/07/2017 10:49 AM  SpO2 98 % 08/07/2017 10:48 AM  Vitals shown include unvalidated device data.  Last Pain:  Vitals:   08/07/17 0659  TempSrc:   PainSc: 6       Patients Stated Pain Goal: 3 (08/07/17 4585)  Complications: No apparent anesthesia complications

## 2017-08-07 NOTE — Anesthesia Preprocedure Evaluation (Addendum)
Anesthesia Evaluation  Patient identified by MRN, date of birth, ID band Patient awake    Reviewed: Allergy & Precautions, NPO status , Patient's Chart, lab work & pertinent test results  Airway Mallampati: II  TM Distance: >3 FB Neck ROM: Full    Dental  (+) Teeth Intact, Dental Advisory Given   Pulmonary asthma ,    breath sounds clear to auscultation       Cardiovascular negative cardio ROS   Rhythm:Regular Rate:Normal     Neuro/Psych  Headaches, PSYCHIATRIC DISORDERS Anxiety TIA   GI/Hepatic Neg liver ROS, GERD  ,  Endo/Other  negative endocrine ROS  Renal/GU negative Renal ROS     Musculoskeletal  (+) Arthritis , Rheumatoid disorders,    Abdominal Normal abdominal exam  (+)   Peds  Hematology   Anesthesia Other Findings   Reproductive/Obstetrics                             Lab Results  Component Value Date   WBC 6.1 07/29/2017   HGB 9.1 (L) 06/23/2017   HCT 38.7 07/29/2017   MCV 76.5 (L) 07/29/2017   PLT 241 07/29/2017   Lab Results  Component Value Date   CREATININE 0.70 08/02/2017   BUN 11 08/02/2017   NA 140 08/02/2017   K 3.8 08/02/2017   CL 105 08/02/2017   CO2 26 08/02/2017   Lab Results  Component Value Date   INR 1.13 08/02/2017   EKG: normal sinus rhythm.  Anesthesia Physical Anesthesia Plan  ASA: II  Anesthesia Plan: General   Post-op Pain Management:    Induction: Intravenous  PONV Risk Score and Plan: 4 or greater and Ondansetron, Dexamethasone, TIVA, Midazolam and Scopolamine patch - Pre-op  Airway Management Planned: Oral ETT  Additional Equipment: None  Intra-op Plan:   Post-operative Plan: Extubation in OR  Informed Consent: I have reviewed the patients History and Physical, chart, labs and discussed the procedure including the risks, benefits and alternatives for the proposed anesthesia with the patient or authorized representative who  has indicated his/her understanding and acceptance.   Dental advisory given  Plan Discussed with: CRNA  Anesthesia Plan Comments: (MH Protocol for possible anesthesia allergy per patient)       Anesthesia Quick Evaluation

## 2017-08-07 NOTE — H&P (Signed)
H&P  CC: Intermittent sharp RUQ pain; small ventral hernias - here for surgery  HPI: Danielle Holden is a pleasant 47yoF with hx of asthma, anxiety and depression here today for surgery. She describes a history of intermittent crampy/sharp right upper quadrant pain which is made worse with fatty foods. This has been going on for many months if not years and she has altered her diet due to this. Nothing makes the pain better aside from time. The pain occasionally radiates to the mid epigastric region. She has associated nausea with the pain but no emesis. She denies history of fever/chills. She does note a history of "colitis" - and recently spent extended period time in [place] where she was evaluated for this. She had bloody diarrhea and workup there was significant for proctitis/distal colitis and she was referred to GI. She states she had a colonoscopy done that demonstrated "colitis" and was also told she had IBS.  PMH: Rheumatoid arthritis, lupus, chronic anemia, vertigo, migraines  PSH: Laparoscopic appendectomy-2009; tubal ligation 1993; C-section via Pfannenstiel 1989  FHx: Denies FHx of malignancy - Father deceased from suicide  Social: Denies use of tobacco/EtOH/drugs  ROS: A comprehensive 10 system review of systems was completed with the patient and pertinent findings as noted above.   Diagnostic Studies History  Colonoscopy  within last year Mammogram  within last year Pap Smear  1-5 years ago  Allergies  Anesthetics, Amide  Trazodone and Nefazodone  Benadryl *ANTIHISTAMINES*  Effexor *ANTIDEPRESSANTS*  Nortriptyline HCl *ANTIDEPRESSANTS*   Medication History Oxycodone-Acetaminophen (10-325MG  Tablet, Oral) Active. Naproxen (500MG  Tablet, Oral) Active. Fluticasone Propionate (50MCG/ACT Suspension, Nasal) Active. Ferrous Sulfate (325 (65 Fe)MG Tablet, Oral) Active. Medications Reconciled  Social History Caffeine use  Coffee, Tea. No alcohol  use  No drug use  Tobacco use  Never smoker.  Family History Alcohol Abuse  Father. Arthritis  Father. Depression  Father, Mother. Diabetes Mellitus  Family Members In General. Ischemic Bowel Disease  Father. Migraine Headache  Father, Mother.  Pregnancy / Birth History  Age at menarche  7 years. Contraceptive History  Intrauterine device, Oral contraceptives. Gravida  4 Irregular periods  Maternal age  78-20 Para  3  Other Problems Anxiety Disorder  Arthritis  Cholelithiasis  Hemorrhoids  Migraine Headache  Other disease, cancer, significant illness   Review of Systems  General Not Present- Appetite Loss, Chills, Fatigue, Fever, Night Sweats, Weight Gain and Weight Loss. Skin Not Present- Change in Wart/Mole, Dryness, Hives, Jaundice, New Lesions, Non-Healing Wounds, Rash and Ulcer. HEENT Present- Seasonal Allergies and Sinus Pain. Not Present- Earache, Hearing Loss, Hoarseness, Nose Bleed, Oral Ulcers, Ringing in the Ears, Sore Throat, Visual Disturbances, Wears glasses/contact lenses and Yellow Eyes. Respiratory Not Present- Bloody sputum, Chronic Cough, Difficulty Breathing, Snoring and Wheezing. Cardiovascular Not Present- Chest Pain, Difficulty Breathing Lying Down, Leg Cramps, Palpitations, Rapid Heart Rate, Shortness of Breath and Swelling of Extremities. Gastrointestinal Present- Excessive gas and Hemorrhoids. Not Present- Abdominal Pain, Bloating, Bloody Stool, Change in Bowel Habits, Chronic diarrhea, Constipation, Difficulty Swallowing, Gets full quickly at meals, Indigestion, Nausea, Rectal Pain and Vomiting. Musculoskeletal Present- Joint Pain and Joint Stiffness. Not Present- Back Pain, Muscle Pain, Muscle Weakness and Swelling of Extremities. Neurological Present- Headaches. Not Present- Decreased Memory, Fainting, Numbness, Seizures, Tingling, Tremor, Trouble walking and Weakness. Psychiatric Present- Anxiety. Not Present- Bipolar,  Change in Sleep Pattern, Depression, Fearful and Frequent crying. Endocrine Not Present- Cold Intolerance, Excessive Hunger, Hair Changes, Heat Intolerance, Hot flashes and New Diabetes. Hematology Not Present- Blood  Thinners, Easy Bruising, Excessive bleeding, Gland problems, HIV and Persistent Infections.    Vitals:   08/07/17 0632 08/07/17 0638  BP: 126/72   Pulse: 67   Resp: 16   Temp:  98 F (36.7 C)  TempSrc: Oral   SpO2: 96%   Weight: 67.6 kg (149 lb)   Height: 5\' 3"  (1.6 m)       Physical Exam Constitutional: No acute distress; conversant; no deformities Eyes: Moist conjunctiva; no lid lag; anicteric sclerae; pupils equal round and reactive to light Neck: Trachea midline; no palpable thyromegaly Lungs: Normal respiratory effort; no tactile fremitus CV: Regular rate and rhythm; no palpable thrill; no pitting edema GI: Abdomen soft, nontender, nondistended; no palpable hepatosplenomegaly; negative Murphy's sign. ?Supraumbilical defect/hernia - firmess in this region. No overlying skin changes. MSK: Normal gait; no clubbing/cyanosis Psychiatric: Appropriate affect; alert and oriented 3 Lymphatic: No palpable cervical or axillary lymphadenopathy   Assessment & Plan  Impression: Danielle Holden is a pleasant 47yoF with hx of RA, Lupus, chronic anemia, "colitis" here today with likely underlying symptomatic cholelithiasis and possible supraumbilical ventral hernia  -OR for laparoscopic versus open cholecystectomy for her symptomatic cholelithiasis & probable primary ventral hernia repair  RUQ [place] 08/09/16 showed stones and sludge; her LFTs have been normal as has her WBC; she has no abnormal lipases levels in our system either.   The anatomy and physiology of the hepatobiliary tract was discussed at length with the patient. The pathophysiology of ventral hernias and gallbladder disease was discussed at length with associated pictures and illustrations. Minimally invasive  as well as open techniques were discussed. -The planned procedure, material risks (including, but not limited to, pain, bleeding, infection, scarring, need for blood transfusion, damage to surrounding structures- blood vessels/nerves/viscus/organs, damage to bile duct, bile leak, need for additional procedures, hernia & recurrence of hernia, pancreatitis, pneumonia, heart attack, stroke, death) benefits and alternatives to surgery were discussed at length. I noted a good probability that the procedure would help improve their symptoms. She voiced understanding. Her questions were answered and she elected to proceed with surgery.  08/11/16. Stephanie Coup, M.D. General and Colorectal Surgery [facility]

## 2017-08-07 NOTE — Discharge Instructions (Signed)
POST OP INSTRUCTIONS ° °1. DIET: As tolerated. Follow a light bland diet the first 24 hours after arrival home, such as soup, liquids, crackers, etc.  Be sure to include lots of fluids daily.  Avoid fast food or heavy meals as your are more likely to get nauseated.  Eat a low fat the next few days after surgery. ° °2. Take your usually prescribed home medications unless otherwise directed. ° °3. PAIN CONTROL: °a. Pain is best controlled by a usual combination of three different methods TOGETHER: °i. Ice/Heat °ii. Over the counter pain medication °iii. Prescription pain medication °b. Most patients will experience some swelling and bruising around the surgical site.  Ice packs or heating pads (30-60 minutes up to 6 times a day) will help. Some people prefer to use ice alone, heat alone, alternating between ice & heat.  Experiment to what works for you.  Swelling and bruising can take several weeks to resolve.   °c. It is helpful to take an over-the-counter pain medication regularly for the first few weeks: °i. Ibuprofen (Motrin/Advil) - 200mg tabs - take 3 tabs (600mg) every 6 hours as needed for pain °ii. Acetaminophen (Tylenol) - you may take 650mg every 6 hours as needed. You can take this with motrin as they act differently on the body. If you are taking a narcotic pain medication that has acetaminophen in it, do not take over the counter tylenol at the same time. ° Iii. NOTE: You may take both of these medications together - most patients  find it most helpful when alternating between the two (i.e. Ibuprofen at 6am,  tylenol at 9am, ibuprofen at 12pm ...) °d. A  prescription for pain medication should be given to you upon discharge.  Take your pain medication as prescribed if your pain is not adequatly controlled with the over-the-counter pain reliefs mentioned above. ° °4. Avoid getting constipated.  Between the surgery and the pain medications, it is common to experience some constipation.  Increasing fluid  intake and taking a fiber supplement (such as Metamucil, Citrucel, FiberCon, MiraLax, etc) 1-2 times a day regularly will usually help prevent this problem from occurring.  A mild laxative (prune juice, Milk of Magnesia, MiraLax, etc) should be taken according to package directions if there are no bowel movements after 48 hours.   ° °5. Dressing: Your incisions are covered in Dermabond which is like sterile superglue for the skin. This will come off on it's own in a couple weeks. It is waterproof and you may bathe normally starting the day after your surgery in a shower. Avoid baths/pools/lakes/oceans until your wounds have fully healed. ° °6. ACTIVITIES as tolerated:   °a. Avoid heavy lifting (>10lbs or 1 gallon of milk) for the next 6 weeks. °b. You may resume regular (light) daily activities beginning the next day--such as daily self-care, walking, climbing stairs--gradually increasing activities as tolerated.  If you can walk 30 minutes without difficulty, it is safe to try more intense activity such as jogging, treadmill, bicycling, low-impact aerobics.  °c. DO NOT PUSH THROUGH PAIN.  Let pain be your guide: If it hurts to do something, don't do it. °d. You may drive when you are no longer taking prescription pain medication, you can comfortably wear a seatbelt, and you can safely maneuver your car and apply brakes. °e. You may have sexual intercourse when it is comfortable.  ° °7. FOLLOW UP in our office °a. Please call [facility] at [phone] to set up an appointment   to see your surgeon in the office for a follow-up appointment approximately 2 weeks after your surgery. °b. Make sure that you call for this appointment the day you arrive home to insure a convenient appointment time. ° °9. If you have disability or family leave forms that need to be completed, you may have them completed by your primary care physician's office; for return to work instructions, please ask our office staff and they will be happy to  assist you in obtaining this documentation °  °When to call us [phone]: °1. Poor pain control °2. Reactions / problems with new medications (rash/itching, etc)  °3. Fever over 101.5 F (38.5 C) °4. Inability to urinate °5. Nausea/vomiting °6. Worsening swelling or bruising °7. Continued bleeding from incision. °8. Increased pain, redness, or drainage from the incision ° °The clinic staff is available to answer your questions during regular business hours (8:30am-5pm).  Please don’t hesitate to call and ask to speak to one of our nurses for clinical concerns.   A surgeon from [facility] is always on call at the hospitals °  °If you have a medical emergency, go to the nearest emergency room or call 911. ° °[facility] °[address], [address], [place], [place]  [address] °MAIN: [phone] °FAX: [phone] [email or web address] ° ° °

## 2017-08-07 NOTE — Op Note (Signed)
08/07/2017 10:27 AM  PATIENT: Danielle Holden  47 y.o. female  Patient Care Team: Patient, No Pcp Per as PCP - General (General Practice) Brock Bad, MD as Consulting Physician (Obstetrics and Gynecology) Malachy Mood, MD as Consulting Physician (Hematology) Pollyann Samples, NP as Nurse Practitioner (Nurse Practitioner)  PRE-OPERATIVE DIAGNOSIS: Symptomatic cholelithiasis; ventral hernia  POST-OPERATIVE DIAGNOSIS: Symptomatic cholelithiasis; umbilical hernia  PROCEDURE:  1. Laparoscopic cholecystectomy 2. Umbilical hernia repair (primary repair, no mesh)  SURGEON: Stephanie Coup. Masiyah Engen, MD  ASSISTANT: none   ANESTHESIA: General endotracheal  EBL: Total I/O In: 1000 [I.V.:1000] Out: 15 [Blood:15]  DRAINS: None  SPECIMEN: Gallbladder  COUNTS: Sponge, needle and instrument counts were reported correct x2 at the conclusion of the operation  DISPOSITION: PACU in satisfactory condition  FINDINGS: Omentum adherent to gallbladder, taken down. Critical view of safety obtained prior to clipping or dividing any structures. 1x1cm umbilical hernia repaired primarily without mesh due to cholecystectomy being performed at same time.  INDICATION:   Ms. Danielle Holden is a pleasant 46yoF with hx of asthma, anxiety and depression here today for surgery. She describes a history of intermittent crampy/sharp right upper quadrant pain which is made worse with fatty foods. This has been going on for many months if not years and she has altered her diet due to this. Nothing makes the pain better aside from time. The pain occasionally radiates to the mid epigastric region. She has associated nausea with the pain but no emesis. She denies history of fever/chills. She does note a history of "colitis" - and recently spent extended period time in [place] where she was evaluated for this. She had bloody diarrhea and workup there was significant for proctitis/distal colitis and she was referred to  GI. She states she had a colonoscopy done that demonstrated "colitis" and was also told she had IBS.  RUQ [place] 08/09/16 showed stones and sludge; her LFTs have been normal as has her WBC; she has no abnormal lipases levels in our system either.   The anatomy and physiology of the hepatobiliary tract was discussed at length with the patient. The pathophysiology of ventral hernias and gallbladder disease was discussed at length with associated pictures and illustrations. Minimally invasive as well as open techniques were discussed.  The planned procedure, material risks (including, but not limited to, pain, bleeding, infection, scarring, need for blood transfusion, damage to surrounding structures- blood vessels/nerves/viscus/organs, damage to bile duct, bile leak, need for additional procedures, hernia & recurrence of hernia, pancreatitis, pneumonia, heart attack, stroke, death) benefits and alternatives to surgery were discussed at length. I noted a good probability that the procedure would help improve their symptoms. She voiced understanding. Her questions were answered and she elected to proceed with surgery.  DESCRIPTION:  The patient was identified & brought into the operating room. The patient was positioned supine on the OR table. SCDs were in place and active during the entire case. The patient underwent general endotracheal anesthesia. The abdomen was prepped and draped in the standard sterile fashion and antibiotics were administered. A surgical timeout was performed and confirmed our plan.   Given her prior surgical history, decision was made to perform entry via Veress needle. A left upper quadrant stab incision was made at Palmer's point. The Veress needle was then insterted and intraperitoneal location confirmed with aspiration and saline drop test.  Insufflation then commenced to a maximum pressure of 15 mmHg with CO2.  A 5 mm Optiview was then inserted in the perineal cavity  under direct  visualization.  Inspection with the laparoscope revealed no evidence of trocar site complication  Attention was turned to the umbilical region.  A small hernia was noted.  This hernia sac was empty.  The hernia went straight into the umbilical stalk.  An infraumbilical incision was made in the umbilical stalk grasped with a Kocher clamp and retracted outwardly.  The fascia was incised and the peritoneal cavity entered.  An 11 mm trocar was inserted into the peritoneal cavity.  The patient was then positioned in reverse Trendelenburg with the left side down. 3 additional 47mm trocars were placed along the right subcostal line - one 34mm port in mid subcostal region, another 81mm port in the right flank near the anterior axillary line, and a third 10mm port in the left subxiphoid region obliquely near the falciform ligament.  The liver and gallbladder were inspected. The gallbladder fundus was grasped and elevated cephalad. An additional grasper was then placed on the infundibulum of the gallbladder and the infundibulum was retracted laterally. Gentle blunt dissection was then employed with a Art gallery manager working down into [employer]. The peritoneum on both sides of the gallbladder was opened with hook cautery. The cystic duct was identified, carefully circumferentially dissected. The cystic artery was then carefully circumferentially dissected. The space between the cystic artery and hepatocystic plate was developed such that the liver could be seen through a window medial to the cystic artery. The triangle of Calot was cleared of all fibrofatty tissue. At this point, a critical view of safety was achieved and the only structures visualized was the skeletonized cystic duct laterally, the skeletonized cystic artery and the liver through the window medial to the artery.   The cystic duct and artery were clipped with 2 clips on the "stay" side and 1 clip on the specimen side. The cystic duct and artery  were then divided. The gallbladder was then freed from its remaining attachments to the liver using electrocautery and placed into an endocatch bag. Hemostasis was achieved and then re-verified. The rest of the abdomen was inspected no injury nor bleeding elsewhere was identified. The RUQ was irrigated with normal saline. The clips and gallbladder fossa were reinspected and noted to be in place and hemostatic.  The gallbladder and endocatch bag was then removed from the umbilical port site and passed off as specimen.  Attention was turned to primary closure of the umbilical hernia.  The fascia surrounding the umbilicus was identified and healthy at all edges.  The overlying subcutaneous tissue was cleared from the underlying fascia.  The fascia was then primarily approximated in this location using 0 Vicryl suture.  The abdomen was then reinsufflated and the repair was inspected and noted to be intact and complete.  The abdominal wall was then surveyed and there were no additional hernia defects identified.  Additional attention was taken to look at the right abdominal wall given the possibility of hernia seen on her CAT scan.  None was identified.  The RUQ ports were removed under direct visualization and noted to be hemostatic. The umbilical fascia was then closed using 0 Vicryl suture. The skin of all incision sites was approximated with 4-0 monocryl subcuticular suture and dermabond applied. The patient was then extubated and transferred to a stretcher for transport to PACU in satisfactory condition.

## 2017-08-08 ENCOUNTER — Encounter (HOSPITAL_COMMUNITY): Payer: Self-pay | Admitting: Surgery

## 2017-08-22 ENCOUNTER — Encounter: Payer: Self-pay | Admitting: Internal Medicine

## 2017-08-23 NOTE — Progress Notes (Signed)
[facility]  [phone] [phone]  Clinic Follow up Note   Patient Care Team: Patient, No Pcp Per as PCP - General (General Practice) Brock Bad, MD as Consulting Physician (Obstetrics and Gynecology) Malachy Mood, MD as Consulting Physician (Hematology) Pollyann Samples, NP as Nurse Practitioner (Nurse Practitioner) 08/26/2017  CHIEF COMPLAINT:  Iron deficiency anemia   HISTORY OF PRESENTING ILLNESS 07/01/17:  Danielle Holden 47 y.o. female is here because of iron deficiency anemia. She was referred by ObGYN Dr. Clearance Coots. She does not have a PCP. She reports having anemia since childhood, intermittently treated with oral iron and multivitamins per her mother. She had a hypochromic, microcytic anemia that was first evident on labs in 2009 per [facility] when she lived in [place], Hgb 10.9. She moved to [facility] in 2014, Hgb 11.5 at that time. On 06/18/13 she presented to our local ER for chest tightness, headache, dizziness, and weakness. Work up revealed severely low Hgb 7.4, iron studies revealed ferritin 2, serum iron 15 with very low %sat 4; she was started on 1 tablet daily oral iron replacement and told to f/u with ObGyn. She has been taking oral iron consistently since then.  Recently switched to prenatal vitamin because ferrous sulfate caused constipation. In 11/2016 during a period where she lived in [place] again, she noted blood and mucus in her stool with associated abdominal pain. She had colonoscopy and reportedly was diagnosed with colitis and IBS. I do not have that report or any biopsy that was done. Iron studies at that time again noted to be severely low, ferritin 3, serum iron 17, %sat 3, and elevated TIBC 606 despite taking oral iron supplement.  She reports history of heavy and irregular menses, often with 2 cycles in the same month with heavy bleeding up to 14-20 days per month. She has tried numerous hormonal therapies including  OCP and IUD ultimately having to discontinue due to side effects. She and her OBGyn are considering partial hysterectomy.  Occasionally takes naproxen for dysmenorrhea; she is not on anticoagulant. Denies recent bleeding such as epistaxis or hematochezia.  She had a UA in the ER recently that was positive for RBCs but she denies frank hematuria.  She has increased fatigue. Occasional dry cough; no fever, chills, or recent infection.  She has a history of vertigo with occasional dizziness.  She does report occasional palpitations but attributes this to her history of anxiety.  Has intermittent nausea, abdominal ultrasound is pending per Dr. Cliffton Asters.  Bowel movements currently are normal.  She denies recent chest pain, dyspnea on exertion, shortness of breath, lightheadedness, headache, or syncope.   She had no prior history or diagnosis of cancer.  Family history is positive for GYN malignancy in MGM and PGM. PAP smear and mammogram are up-to-date. She denies any pica.  Does not eat red meat in her diet due to IBS. She never donated blood or received blood transfusion.  Other past medical history positive for asthma, lupus, RA, migraine with aura, and ventral hernia.  She is planning to undergo cholecystectomy per Dr. Cliffton Asters in 07/31/2017.  She has no known family history of anemia or blood disorder.  She is not working, disabled due to her anxiety.  She has 3 children who are alive and healthy.  She lives alone.  CURRENT THERAPY: IV Feraheme 2/27, 3/6   INTERVAL HISTORY: ENEZ MONAHAN returns for follow up anemia as scheduled. S/p cholecystectomy on 08/07/17. She presents tot  the clinic today by herself. She has 2 doses of IV iron on 07/10/17 and 07/17/17. She reports she is still fatigues but she believes this may be due to her surgery she had 2 weeks ago. She states her last period was very heavy but she has not had it yet this month. Her RA is stable.   On review of systems, pt denies any other complaints at  this time. Pertinent positives are listed and detailed within the above HPI.   REVIEW OF SYSTEMS:   Constitutional: Denies fevers, chills or abnormal weight loss (+) fatigue Eyes: Denies blurriness of vision Ears, nose, mouth, throat, and face: Denies mucositis or sore throat (+) dark tongue  Respiratory: Denies cough, dyspnea or wheezes Cardiovascular: Denies palpitation, chest discomfort or lower extremity swelling Gastrointestinal:  Denies heartburn or change in bowel habits (+) intermittent nausea (+) hernia, repaired (+) intermittent hematochezia with heavy NSAID use. GU/GYN: Denies hematuria, dysuria, urgency  Skin: Denies abnormal skin rashes Lymphatics: Denies new lymphadenopathy or easy bruising Neurological:Denies numbness, tingling or new weaknesses (+) Lightheadedness, dizziness r/t vertigo, diagnosed 2013 Behavioral/Psych: Mood is stable, no new changes  All other systems were reviewed with the patient and are negative.  MEDICAL HISTORY:  Past Medical History:  Diagnosis Date  . Abnormal uterine bleeding   . Anxiety   . Arthritis    hands, knees, shoulder, back   . Asthma   . Colitis    History of  . Complication of anesthesia 01/1992   During tubal ligation heart stopped and she stopped breathing  . Environmental and seasonal allergies   . Fatigue   . Gallstones   . GERD (gastroesophageal reflux disease)   . Heart rate slow    per watch  . History of kidney stones   . History of shingles   . Hypoglycemia   . IBS (irritable bowel syndrome)   . Iron deficiency anemia    receive iron infusion  . Lupus   . Migraine   . Numbness and tingling of both upper extremities    first thing in the morning when she wakes up  . Numbness and tingling of lower extremity    first thing in the morning when she wakes up  . RA (rheumatoid arthritis) (HCC)   . Ventral hernia   . Vertigo     SURGICAL HISTORY: Past Surgical History:  Procedure Laterality Date  .  APPENDECTOMY    . CESAREAN SECTION    . CHOLECYSTECTOMY [name] 08/07/2017   Procedure: LAPAROSCOPIC CHOLECYSTECTOMY, PRIMARY REPAIR OF UMBILICAL HERNIA;  Surgeon: Andria Meuse, MD;  Location: [facility];  Service: General;  Laterality: [name];  . COLONOSCOPY    . IUD REMOVAL [name] 11/12/2014  . TUBAL LIGATION      I have reviewed the social history and family history with the patient and they are unchanged from previous note.  ALLERGIES:  is allergic to anesthetics, amide; benzocaine; black pepper [piper]; mushroom extract complex; trazodone and nefazodone; amoxicillin; bee venom; benadryl [diphenhydramine]; effexor [venlafaxine]; lactose intolerance (gi); nortriptyline; nsaids; and other.  MEDICATIONS:  Current Outpatient Medications  Medication Sig Dispense Refill  . aluminum hydroxide-magnesium carbonate (GAVISCON) 95-358 MG/15ML SUSP Take 15 mLs by mouth as needed for indigestion or heartburn.    . loratadine (CLARITIN) 10 MG tablet Take 10 mg by mouth daily as needed for allergies.    . naproxen (NAPROSYN) 500 MG tablet Take 500 mg by mouth 2 (two) times daily as needed (pain, migraines).     Marland Kitchen  ondansetron (ZOFRAN ODT) 4 MG disintegrating tablet Take 1 tablet (4 mg total) by mouth every 8 (eight) hours as needed. (Patient taking differently: Take 4 mg by mouth every 8 (eight) hours as needed for nausea or vomiting. ) 10 tablet 0  . traMADol (ULTRAM) 50 MG tablet Take 1 tablet (50 mg total) by mouth every 6 (six) hours as needed (pain not controlled with ibuprofen/tylenol). 30 tablet 0  . ferrous sulfate 325 (65 FE) MG tablet Take 1 tablet (325 mg total) by mouth daily with breakfast. (Patient not taking: Reported on 07/26/2017) 60 tablet 5   No current facility-administered medications for this visit.     PHYSICAL EXAMINATION: ECOG PERFORMANCE STATUS: 1 - Symptomatic but completely ambulatory  Vitals:   08/26/17 0856  BP: 108/61  Pulse: 64  Resp: 18  Temp: 97.9 F (36.6 C)  SpO2:  100%   Filed Weights   08/26/17 0856  Weight: 149 lb 4.8 oz (67.7 kg)    GENERAL:alert, no distress and comfortable SKIN: skin color, texture, turgor are normal, no rashes or significant lesions EYES: normal, Conjunctiva are pink and non-injected, sclera clear OROPHARYNX:no exudate, no erythema; lips and buccal mucosa are normal (+) glossal hyperpigmentation  LYMPH:  no palpable cervical, supraclavicular, or axillary lymphadenopathy LUNGS: clear to auscultation bilaterally with normal breathing effort HEART: regular rate & rhythm and no murmurs and no lower extremity edema ABDOMEN:abdomen soft and normal bowel sounds. (+) mild tenderness on palpation to RUQ  Musculoskeletal:no cyanosis of digits and no clubbing  NEURO: alert & oriented x 3 with fluent speech, no focal motor/sensory deficits  LABORATORY DATA:  I have reviewed the data as listed CBC Latest Ref Rng & Units 08/26/2017 07/29/2017 07/17/2017  WBC 3.9 - 10.3 K/uL 6.1 6.1 5.8  Hemoglobin 12.0 - 15.0 g/dL - - -  Hematocrit 56.3 - 46.6 % 42.4 38.7 38.0  Platelets 145 - 400 K/uL 244 241 309     CMP Latest Ref Rng & Units 08/02/2017 06/23/2017 05/30/2017  Glucose 65 - 99 mg/dL 86 96 81  BUN 6 - 20 mg/dL 11 13 11   Creatinine 0.44 - 1.00 mg/dL 1.49 7.02 6.37  Sodium 135 - 145 mmol/L 140 135 142  Potassium 3.5 - 5.1 mmol/L 3.8 3.7 4.3  Chloride 101 - 111 mmol/L 105 107 105  CO2 22 - 32 mmol/L 26 22 20   Calcium 8.9 - 10.3 mg/dL 9.2 8.5(Y) 8.5(O)  Total Protein 6.5 - 8.1 g/dL 7.6 7.4 7.0  Total Bilirubin 0.3 - 1.2 mg/dL 0.3 2.7(X) <4.1  Alkaline Phos 38 - 126 U/L 74 88 81  AST 15 - 41 U/L 19 19 18   ALT 14 - 54 U/L 15 14 11    Iron studies Results for PERLINA, KOHMAN (MRN [id number]) as of 08/26/2017 09:41  Ref. Range 07/01/2017 16:05 07/17/2017 08:23 07/29/2017 07:56  Iron Latest Ref Range: 41 - 142 ug/dL 17 (L) 094 (H) 709 (H)  UIBC Latest Units: ug/dL 628 366 294  TIBC Latest Ref Range: 236 - 444 ug/dL 765 (H) 465 035    Saturation Ratios Latest Ref Range: 21 - 57 % 3 (L) 43 55  Ferritin Latest Ref Range: 9 - 269 ng/mL 13 432 (H) 506 (H)    RADIOGRAPHIC STUDIES: I have personally reviewed the radiological images as listed and agreed with the findings in the report. No results found.   ASSESSMENT & PLAN: Danielle Holden is a 47 year old female with history of iron deficiency anemia, colitis, IBS, lupus, and  RA.   1.  Anemia, iron deficiency due to chronic blood loss  -We previously reviewed her medical record in detail.  She has a long-standing history of a hypochromic, microcytic anemia, elevated RDW, and low iron studies, consistent with iron deficiency anemia.   -Anemia is likely secondary to chronic GYN blood loss. She is planning to undergo hysterectomy in the future. She has been on oral supplementation for at least 5 years, recent iron study in 2018 still significantly low.  -She also has questionable lupus, which can also cause anemia of chronic disease.  Given her moderate anemia, pending cholecystectomy in a month, I recommended IV iron weekly twice, -S/p IV Feraheme on 2/27 and 3/6, she tolerated well and responded well. Hgb normalized. Her fatigue has improved some. -Continues to have heavy menses, Ongoing discussion with GYN for partial hysterectomy, potentially in the summer. She will f/u with Dr. Clearance Coots for further discussion  -She is not on oral iron due to constipation; previously discussed she may need additional IV Feraheme in the future as long as she has heavy menses.  -Her hgb is 13.3 today and her MCV is normal as well. Iron studies are pending, I will call with results and recommend IV iron if needed.  -I recommend her to eat iron rich foods since she cannot take oral iron supplement. She can take a multivitamin with iron.  -Lab in 2, 4 and 6 months -F/u in 6 months   2. Colitis, IBS -Patient underwent colonoscopy on 11/22/16 that was notable for segmental area of mildly congested,  erythematous mucosa in the colon and rectum. Pathology positive for mildly active colitis in the right and left colon with features of chronicity. Otherwise unremarkable exam.  -I encouraged her to avoid NSAIDs due to increased bleeding risk. She agrees.   3. Lupus, RA -She did not f/u with Dr. Sharmon Revere at [facility] as instructed last month; She was given the number and advised to call and f/u with them  4. Cholecystitis, ventral hernia  -s/p cholecystectomy and hernia repair per Dr. Cliffton Asters on 08/07/17  5. Vertigo, diagnosed 2013 -She has intermittent dizziness; she can use over the counter meclizine PRN  PLAN -iron studies pending, will call with results and recommend IV iron if needed, will keep her ferritin above 50  -Start multivitamin and increase iron in her diet -Lab in 2, 4 and 6 months -F/u in 6 months   All questions were answered. The patient knows to call the clinic with any problems, questions or concerns. No barriers to learning was detected. I spent 15 minutes counseling the patient face to face. The total time spent in the appointment was 20 minutes and more than 50% was on counseling.  This document serves as a record of services personally performed by Malachy Mood, MD. It was created on her behalf by Rosana Fret, a trained medical scribe. The creation of this record is based on the scribe's personal observations and the provider's statements to them.   I have reviewed the above documentation for accuracy and completeness, and I agree with the above.    Malachy Mood, MD 08/26/17 9:40 AM

## 2017-08-26 ENCOUNTER — Encounter: Payer: Self-pay | Admitting: Hematology

## 2017-08-26 ENCOUNTER — Inpatient Hospital Stay: Payer: Medicaid Other | Attending: Hematology

## 2017-08-26 ENCOUNTER — Telehealth: Payer: Self-pay

## 2017-08-26 ENCOUNTER — Inpatient Hospital Stay (HOSPITAL_BASED_OUTPATIENT_CLINIC_OR_DEPARTMENT_OTHER): Payer: Medicaid Other | Admitting: Hematology

## 2017-08-26 VITALS — BP 108/61 | HR 64 | Temp 97.9°F | Resp 18 | Ht 63.0 in | Wt 149.3 lb

## 2017-08-26 DIAGNOSIS — N920 Excessive and frequent menstruation with regular cycle: Secondary | ICD-10-CM | POA: Insufficient documentation

## 2017-08-26 DIAGNOSIS — D500 Iron deficiency anemia secondary to blood loss (chronic): Secondary | ICD-10-CM

## 2017-08-26 DIAGNOSIS — Z79899 Other long term (current) drug therapy: Secondary | ICD-10-CM | POA: Insufficient documentation

## 2017-08-26 DIAGNOSIS — N926 Irregular menstruation, unspecified: Secondary | ICD-10-CM

## 2017-08-26 LAB — IRON AND TIBC
Iron: 144 ug/dL — ABNORMAL HIGH (ref 41–142)
Saturation Ratios: 45 % (ref 21–57)
TIBC: 318 ug/dL (ref 236–444)
UIBC: 174 ug/dL

## 2017-08-26 LAB — CBC WITH DIFFERENTIAL (CANCER CENTER ONLY)
BASOS ABS: 0.1 10*3/uL (ref 0.0–0.1)
BASOS PCT: 1 %
EOS ABS: 0.9 10*3/uL — AB (ref 0.0–0.5)
Eosinophils Relative: 15 %
HCT: 42.4 % (ref 34.8–46.6)
HEMOGLOBIN: 13.3 g/dL (ref 11.6–15.9)
Lymphocytes Relative: 24 %
Lymphs Abs: 1.5 10*3/uL (ref 0.9–3.3)
MCH: 26.1 pg (ref 25.1–34.0)
MCHC: 31.4 g/dL — ABNORMAL LOW (ref 31.5–36.0)
MCV: 83.3 fL (ref 79.5–101.0)
Monocytes Absolute: 0.4 10*3/uL (ref 0.1–0.9)
Monocytes Relative: 6 %
NEUTROS PCT: 54 %
Neutro Abs: 3.3 10*3/uL (ref 1.5–6.5)
Platelet Count: 244 10*3/uL (ref 145–400)
RBC: 5.09 MIL/uL (ref 3.70–5.45)
RDW: 28.1 % — ABNORMAL HIGH (ref 11.2–14.5)
WBC: 6.1 10*3/uL (ref 3.9–10.3)

## 2017-08-26 LAB — RETICULOCYTES
RBC.: 5.09 MIL/uL (ref 3.70–5.45)
RETIC COUNT ABSOLUTE: 25.5 10*3/uL — AB (ref 33.7–90.7)
Retic Ct Pct: 0.5 % — ABNORMAL LOW (ref 0.7–2.1)

## 2017-08-26 LAB — FERRITIN: Ferritin: 196 ng/mL (ref 9–269)

## 2017-08-26 NOTE — Telephone Encounter (Signed)
Printed avs and calender of upcoming appointment. Per 4/15 los 

## 2017-08-30 ENCOUNTER — Telehealth: Payer: Self-pay | Admitting: *Deleted

## 2017-08-30 NOTE — Telephone Encounter (Signed)
-----   Message from Pollyann Samples, NP sent at 08/30/2017  8:44 AM EDT ----- Please let her know iron studies are adequate, no need for IV Feraheme. Continue Dr. Latanya Maudlin recommendations per yesterday's appointment and will continue to check labs every 2 months. Thanks, Clayborn Heron NP

## 2017-08-30 NOTE — Telephone Encounter (Signed)
Left message per Dr Latanya Maudlin instructions on pt's vm with instructions to call back if questions.

## 2017-09-30 ENCOUNTER — Ambulatory Visit: Payer: Medicaid Other | Admitting: Internal Medicine

## 2017-09-30 ENCOUNTER — Encounter: Payer: Self-pay | Admitting: Internal Medicine

## 2017-09-30 VITALS — BP 100/66 | HR 80 | Ht 63.0 in | Wt 150.5 lb

## 2017-09-30 DIAGNOSIS — R197 Diarrhea, unspecified: Secondary | ICD-10-CM | POA: Diagnosis not present

## 2017-09-30 DIAGNOSIS — K625 Hemorrhage of anus and rectum: Secondary | ICD-10-CM

## 2017-09-30 DIAGNOSIS — K529 Noninfective gastroenteritis and colitis, unspecified: Secondary | ICD-10-CM | POA: Diagnosis not present

## 2017-09-30 MED ORDER — COLESTIPOL HCL 1 G PO TABS: 2.0000 g | ORAL_TABLET | Freq: Two times a day (BID) | ORAL | 3 refills | Status: DC

## 2017-09-30 NOTE — Progress Notes (Addendum)
HISTORY OF PRESENT ILLNESS:  Danielle Holden is a 47 y.o. female with a reported history of lupus and unspecified colitis who is referred by Dr. Marin Olp of general surgery regarding diarrhea and rectal bleeding. Patient is originally from [place] that has been [place] approximately 5 years. She tells me that she has had a 20 year history of intermittent problems with diarrhea and minor rectal bleeding. She is a poor historian with limited medical insight. She was evaluated last summer (July 2018) while in [place] with chief complaints of rectal bleeding, diarrhea, and abdominal pain. I have reviewed that initial consultation note. She subsequently underwent complete colonoscopy 11/22/2016. I have reviewed that report. The examination said that she had a normal examined ileum. Congested erythematous granular vascular pattern decreased mucosa from 30-35 cm proximal to the anus. This was biopsied. The distal rectum and anal verge resected be normal. Biopsies were taken throughout. The pathology report from the right colon, left colon were said show "mildly active colitis". Biopsies from the rectum and sigmoid region revealed "chronic moderately active colitis". The pathologic differential diagnosis was brought. Patient did not have follow-up. She tells me that she was not given a diagnosis or initiated on specific therapies. In March of this year she underwent cholecystectomy and umbilical hernia repair with Dr. Anette Riedel. She was seen in follow-up in mentioned problems with worsening diarrhea since her cholecystectomy and minor rectal bleeding. She tells me that she will see minor amounts of blood approximately once per week. She denies that she has ever been treated with inflammatory bowel disease medications. She is on no medication for possible lupus. She is on several NSAIDs. No abdominal pain or weight loss. Review of outside laboratories from 08/26/2017 reveals normal CBC with hemoglobin  13.3. Normal comprehensive metabolic panel in March with albumin 4.2. Normal liver tests. Abdominal ultrasound from February 2019 was unremarkable, including gallbladder. 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 She is also status post appendectomy, tubal ligation, and cesarean section.  REVIEW OF SYSTEMS:  All non-GI ROS negative unless otherwise stated in the history of present illness except for sinus allergy, anxiety, depression, arthritis, fatigue, headaches, night sweats, sleeping problems, sore throat, swollen lymph glands, irregular heartbeat  Past Medical History:  Diagnosis Date  . Abnormal uterine bleeding   . Anxiety   . Arthritis    hands, knees, shoulder, back   . Asthma   . Colitis    History of  . Complication of anesthesia 01/1992   During tubal ligation heart stopped and she stopped breathing  . Depression   . Environmental and seasonal allergies   . Fatigue   . Gallstones   . GERD (gastroesophageal reflux disease)   . Heart rate slow    per watch  . History of kidney stones   . History of shingles   . Hypoglycemia   . IBS (irritable bowel syndrome)   . Iron deficiency anemia    receive iron infusion  . Lactose intolerance   . Lupus (HCC)   . Migraine   . Numbness and tingling of both upper extremities    first thing in the morning when she wakes up  . Numbness and tingling of lower extremity    first thing in the morning when she wakes up  . RA (rheumatoid arthritis) (HCC)   . Ventral hernia   . Vertigo     Past Surgical History:  Procedure Laterality Date  . APPENDECTOMY    . CESAREAN  SECTION    . CHOLECYSTECTOMY [name] 08/07/2017   Procedure: LAPAROSCOPIC CHOLECYSTECTOMY, PRIMARY REPAIR OF UMBILICAL HERNIA;  Surgeon: Andria Meuse, MD;  Location: [facility];  Service: General;  Laterality: [name];  . COLONOSCOPY    . IUD REMOVAL [name] 11/12/2014  . TUBAL LIGATION    . UMBILICAL HERNIA REPAIR      Social History Danielle Holden  reports that she has never smoked. She has never used smokeless tobacco. She reports that she does not drink alcohol or use drugs.  family history includes Breast cancer in her maternal grandmother; Cervical cancer in her paternal grandmother; Diabetes in her maternal grandmother; Suicidality in her father.  Allergies  Allergen Reactions  . Anesthetics, Amide Anaphylaxis and Other (See Comments)    Heart stopped and stopped breathing  . Benzocaine Anaphylaxis  . Black Pepper [Piper] Anaphylaxis    Throat swelling  . Mushroom Extract Complex Anaphylaxis    Mouth swelling  . Trazodone And Nefazodone Anaphylaxis    Tongue swells.  . Amoxicillin Hives and Diarrhea    Has patient had a PCN reaction causing immediate rash, facial/tongue/throat swelling, SOB or lightheadedness with hypotension: No Has patient had a PCN reaction causing severe rash involving mucus membranes or skin necrosis: Yes Has patient had a PCN reaction that required hospitalization: Yes Has patient had a PCN reaction occurring within the last 10 years: Yes If all of the above answers are "NO", then may proceed with Cephalosporin use.   . Bee Venom Swelling  . Benadryl [Diphenhydramine]     Heavy periods   . Effexor [Venlafaxine] Hives  . Lactose Intolerance (Gi)     Stomach pain  . Nortriptyline Hives  . Nsaids     Make colitis worse  . Other Diarrhea    Red meat - stomach pains, bleeding        PHYSICAL EXAMINATION: Vital signs: BP 100/66 (BP Location: Left Arm, Patient Position: Sitting, Cuff Size: Normal)   Pulse 80   Ht 5\' 3"  (1.6 m) Comment: height measured without shoes  Wt 150 lb 8 oz (68.3 kg)   LMP 07/20/2017   BMI 26.66 kg/m   Constitutional: generally well-appearing, no acute distress Psychiatric: alert and oriented x3, cooperative Eyes: extraocular movements intact, anicteric, conjunctiva pink Mouth: oral pharynx moist, no lesions Neck: supple no lymphadenopathy Cardiovascular:  heart regular rate and rhythm, no murmur Lungs: clear to auscultation bilaterally Abdomen: soft, nontender, nondistended, no obvious ascites, no peritoneal signs, normal bowel sounds, no organomegaly Rectal: omitted Extremities: no clubbing, cyanosis, or lower extremity edema bilaterally Skin: no lesions on visible extremities Neuro: No focal deficits. Cranial nerves intact   ASSESSMENT:  #1. Worsening diarrhea postcholecystectomy. Likely bile salt related #2. Minor rectal bleeding likely benign anorectal pathology #3. Colonoscopy elsewhere last year with varying "colitis" on biopsies with normal gross mucosa throughout the colon except for a short segmental region in the left colon. Normal ileum. Clinical significance uncertain.   PLAN:  #1. Colestid 2 g twice daily for bowel so related diarrhea #2. I have asked my CMA to have outside pathology slides reviewed by our local pathologists #3. May need repeat colonoscopy with biopsies pending that reading #4. Office follow-up after the above completed #5. Minimize NSAIDs

## 2017-09-30 NOTE — Patient Instructions (Signed)
We have sent the following medications to your pharmacy for you to pick up at your convenience:  Colestid.  We will call you to schedule a follow up after our pathology department has reviewed your path results

## 2017-10-21 ENCOUNTER — Inpatient Hospital Stay: Payer: Medicaid Other | Attending: Hematology

## 2017-10-21 ENCOUNTER — Telehealth: Payer: Self-pay | Admitting: Nurse Practitioner

## 2017-10-21 DIAGNOSIS — Z79899 Other long term (current) drug therapy: Secondary | ICD-10-CM | POA: Diagnosis not present

## 2017-10-21 DIAGNOSIS — D500 Iron deficiency anemia secondary to blood loss (chronic): Secondary | ICD-10-CM | POA: Diagnosis not present

## 2017-10-21 DIAGNOSIS — N920 Excessive and frequent menstruation with regular cycle: Secondary | ICD-10-CM | POA: Diagnosis not present

## 2017-10-21 DIAGNOSIS — N926 Irregular menstruation, unspecified: Secondary | ICD-10-CM | POA: Insufficient documentation

## 2017-10-21 LAB — CBC WITH DIFFERENTIAL (CANCER CENTER ONLY)
BASOS ABS: 0 10*3/uL (ref 0.0–0.1)
Basophils Relative: 1 %
EOS ABS: 0.4 10*3/uL (ref 0.0–0.5)
EOS PCT: 6 %
HCT: 41.5 % (ref 34.8–46.6)
HEMOGLOBIN: 13.6 g/dL (ref 11.6–15.9)
LYMPHS PCT: 25 %
Lymphs Abs: 1.7 10*3/uL (ref 0.9–3.3)
MCH: 29.8 pg (ref 25.1–34.0)
MCHC: 32.8 g/dL (ref 31.5–36.0)
MCV: 91 fL (ref 79.5–101.0)
Monocytes Absolute: 0.4 10*3/uL (ref 0.1–0.9)
Monocytes Relative: 6 %
NEUTROS PCT: 62 %
Neutro Abs: 4.3 10*3/uL (ref 1.5–6.5)
PLATELETS: 234 10*3/uL (ref 145–400)
RBC: 4.56 MIL/uL (ref 3.70–5.45)
RDW: 18.4 % — ABNORMAL HIGH (ref 11.2–14.5)
WBC: 6.8 10*3/uL (ref 3.9–10.3)

## 2017-10-21 LAB — IRON AND TIBC
IRON: 120 ug/dL (ref 41–142)
Saturation Ratios: 34 % (ref 21–57)
TIBC: 354 ug/dL (ref 236–444)
UIBC: 234 ug/dL

## 2017-10-21 LAB — RETICULOCYTES
RBC.: 4.56 MIL/uL (ref 3.70–5.45)
RETIC COUNT ABSOLUTE: 45.6 10*3/uL (ref 33.7–90.7)
Retic Ct Pct: 1 % (ref 0.7–2.1)

## 2017-10-21 LAB — FERRITIN: Ferritin: 117 ng/mL (ref 9–269)

## 2017-10-21 NOTE — Telephone Encounter (Signed)
Attempted to reach patient to review labs including normal iron studies. No answer. Left nonspecific message to call back [phone].

## 2017-10-22 ENCOUNTER — Telehealth: Payer: Self-pay

## 2017-10-22 NOTE — Telephone Encounter (Signed)
Called patient per Santiago Glad NP iron studies are normal and she is not anemic.  Reminded her of appointment in 12/2017 for lab and in October for lab and follow up.  Patient verbalized an understanding.

## 2017-12-16 ENCOUNTER — Inpatient Hospital Stay: Payer: Medicaid Other | Attending: Hematology

## 2017-12-16 DIAGNOSIS — N926 Irregular menstruation, unspecified: Secondary | ICD-10-CM | POA: Diagnosis not present

## 2017-12-16 DIAGNOSIS — D500 Iron deficiency anemia secondary to blood loss (chronic): Secondary | ICD-10-CM

## 2017-12-16 DIAGNOSIS — Z79899 Other long term (current) drug therapy: Secondary | ICD-10-CM | POA: Insufficient documentation

## 2017-12-16 DIAGNOSIS — N920 Excessive and frequent menstruation with regular cycle: Secondary | ICD-10-CM | POA: Insufficient documentation

## 2017-12-16 LAB — CBC WITH DIFFERENTIAL (CANCER CENTER ONLY)
BASOS PCT: 1 %
Basophils Absolute: 0 10*3/uL (ref 0.0–0.1)
Eosinophils Absolute: 0.3 10*3/uL (ref 0.0–0.5)
Eosinophils Relative: 6 %
HCT: 41.5 % (ref 34.8–46.6)
Hemoglobin: 13.5 g/dL (ref 11.6–15.9)
Lymphocytes Relative: 27 %
Lymphs Abs: 1.4 10*3/uL (ref 0.9–3.3)
MCH: 31.8 pg (ref 25.1–34.0)
MCHC: 32.5 g/dL (ref 31.5–36.0)
MCV: 97.9 fL (ref 79.5–101.0)
MONO ABS: 0.4 10*3/uL (ref 0.1–0.9)
Monocytes Relative: 8 %
NEUTROS PCT: 58 %
Neutro Abs: 3.1 10*3/uL (ref 1.5–6.5)
Platelet Count: 223 10*3/uL (ref 145–400)
RBC: 4.24 MIL/uL (ref 3.70–5.45)
RDW: 13.1 % (ref 11.2–14.5)
WBC Count: 5.3 10*3/uL (ref 3.9–10.3)

## 2017-12-16 LAB — RETICULOCYTES
RBC.: 4.24 MIL/uL (ref 3.70–5.45)
Retic Count, Absolute: 80.6 10*3/uL (ref 33.7–90.7)
Retic Ct Pct: 1.9 % (ref 0.7–2.1)

## 2017-12-16 LAB — FERRITIN: Ferritin: 127 ng/mL (ref 11–307)

## 2017-12-16 LAB — IRON AND TIBC
Iron: 147 ug/dL — ABNORMAL HIGH (ref 41–142)
SATURATION RATIOS: 41 % (ref 21–57)
TIBC: 358 ug/dL (ref 236–444)
UIBC: 211 ug/dL

## 2018-02-24 ENCOUNTER — Telehealth: Payer: Self-pay | Admitting: Hematology

## 2018-02-24 ENCOUNTER — Inpatient Hospital Stay: Payer: Medicaid Other | Attending: Hematology

## 2018-02-24 ENCOUNTER — Inpatient Hospital Stay (HOSPITAL_BASED_OUTPATIENT_CLINIC_OR_DEPARTMENT_OTHER): Payer: Medicaid Other | Admitting: Hematology

## 2018-02-24 DIAGNOSIS — K436 Other and unspecified ventral hernia with obstruction, without gangrene: Secondary | ICD-10-CM | POA: Diagnosis not present

## 2018-02-24 DIAGNOSIS — D500 Iron deficiency anemia secondary to blood loss (chronic): Secondary | ICD-10-CM

## 2018-02-24 DIAGNOSIS — K439 Ventral hernia without obstruction or gangrene: Secondary | ICD-10-CM | POA: Insufficient documentation

## 2018-02-24 DIAGNOSIS — Z9049 Acquired absence of other specified parts of digestive tract: Secondary | ICD-10-CM | POA: Diagnosis not present

## 2018-02-24 DIAGNOSIS — K589 Irritable bowel syndrome without diarrhea: Secondary | ICD-10-CM | POA: Insufficient documentation

## 2018-02-24 DIAGNOSIS — N920 Excessive and frequent menstruation with regular cycle: Secondary | ICD-10-CM | POA: Diagnosis not present

## 2018-02-24 LAB — CBC WITH DIFFERENTIAL (CANCER CENTER ONLY)
Abs Immature Granulocytes: 0.02 10*3/uL (ref 0.00–0.07)
BASOS PCT: 1 %
Basophils Absolute: 0 10*3/uL (ref 0.0–0.1)
EOS ABS: 0.3 10*3/uL (ref 0.0–0.5)
Eosinophils Relative: 4 %
HCT: 45.4 % (ref 36.0–46.0)
Hemoglobin: 15.1 g/dL — ABNORMAL HIGH (ref 12.0–15.0)
IMMATURE GRANULOCYTES: 0 %
Lymphocytes Relative: 24 %
Lymphs Abs: 1.5 10*3/uL (ref 0.7–4.0)
MCH: 31.9 pg (ref 26.0–34.0)
MCHC: 33.3 g/dL (ref 30.0–36.0)
MCV: 95.8 fL (ref 80.0–100.0)
Monocytes Absolute: 0.5 10*3/uL (ref 0.1–1.0)
Monocytes Relative: 7 %
NEUTROS PCT: 64 %
NRBC: 0 % (ref 0.0–0.2)
Neutro Abs: 4.1 10*3/uL (ref 1.7–7.7)
PLATELETS: 214 10*3/uL (ref 150–400)
RBC: 4.74 MIL/uL (ref 3.87–5.11)
RDW: 12.1 % (ref 11.5–15.5)
WBC Count: 6.4 10*3/uL (ref 4.0–10.5)

## 2018-02-24 LAB — RETICULOCYTES
IMMATURE RETIC FRACT: 10.2 % (ref 2.3–15.9)
RBC.: 4.74 MIL/uL (ref 3.87–5.11)
RETIC COUNT ABSOLUTE: 79.2 10*3/uL (ref 19.0–186.0)
Retic Ct Pct: 1.7 % (ref 0.4–3.1)

## 2018-02-24 NOTE — Progress Notes (Signed)
[facility]  [phone] [phone]  Clinic Follow up Note   Patient Care Team: Patient, No Pcp Per as PCP - General (General Practice) Brock Bad, MD as Consulting Physician (Obstetrics and Gynecology) Malachy Mood, MD as Consulting Physician (Hematology) Pollyann Samples, NP as Nurse Practitioner (Nurse Practitioner)   Date of Service:  02/24/2018  CHIEF COMPLAINT:  Iron deficiency anemia   HISTORY OF PRESENTING ILLNESS 07/01/17:  Danielle Holden 47 y.o. female is here because of iron deficiency anemia. She was referred by ObGYN Dr. Clearance Coots. She does not have a PCP. She reports having anemia since childhood, intermittently treated with oral iron and multivitamins per her mother. She had a hypochromic, microcytic anemia that was first evident on labs in 2009 per [facility] when she lived in [place], Hgb 10.9. She moved to [facility] in 2014, Hgb 11.5 at that time. On 06/18/13 she presented to our local ER for chest tightness, headache, dizziness, and weakness. Work up revealed severely low Hgb 7.4, iron studies revealed ferritin 2, serum iron 15 with very low %sat 4; she was started on 1 tablet daily oral iron replacement and told to f/u with ObGyn. She has been taking oral iron consistently since then.  Recently switched to prenatal vitamin because ferrous sulfate caused constipation. In 11/2016 during a period where she lived in [place] again, she noted blood and mucus in her stool with associated abdominal pain. She had colonoscopy and reportedly was diagnosed with colitis and IBS. I do not have that report or any biopsy that was done. Iron studies at that time again noted to be severely low, ferritin 3, serum iron 17, %sat 3, and elevated TIBC 606 despite taking oral iron supplement.  She reports history of heavy and irregular menses, often with 2 cycles in the same month with heavy bleeding up to 14-20 days per month. She has tried numerous hormonal  therapies including OCP and IUD ultimately having to discontinue due to side effects. She and her OBGyn are considering partial hysterectomy.  Occasionally takes naproxen for dysmenorrhea; she is not on anticoagulant. Denies recent bleeding such as epistaxis or hematochezia.  She had a UA in the ER recently that was positive for RBCs but she denies frank hematuria.  She has increased fatigue. Occasional dry cough; no fever, chills, or recent infection.  She has a history of vertigo with occasional dizziness.  She does report occasional palpitations but attributes this to her history of anxiety.  Has intermittent nausea, abdominal ultrasound is pending per Dr. Cliffton Asters.  Bowel movements currently are normal.  She denies recent chest pain, dyspnea on exertion, shortness of breath, lightheadedness, headache, or syncope.   She had no prior history or diagnosis of cancer.  Family history is positive for GYN malignancy in MGM and PGM. PAP smear and mammogram are up-to-date. She denies any pica.  Does not eat red meat in her diet due to IBS. She never donated blood or received blood transfusion.  Other past medical history positive for asthma, lupus, RA, migraine with aura, and ventral hernia.  She is planning to undergo cholecystectomy per Dr. Cliffton Asters in 07/31/2017.  She has no known family history of anemia or blood disorder.  She is not working, disabled due to her anxiety.  She has 3 children who are alive and healthy.  She lives alone.  CURRENT THERAPY: IV Feraheme as needed, she received on 07/10/17, 07/17/17   INTERVAL HISTORY: Danielle Holden returns for follow  up anemia as scheduled. She was last seen by me 6 months ago. She presents to the clinic today alone. She is doing well and states that she has not had menses in 7 months. Her menses stopped naturally.  She sometimes experiences mouth soars from SLE.    REVIEW OF SYSTEMS:   Constitutional: Denies fevers, chills or abnormal weight loss  Eyes: Denies  blurriness of vision Ears, nose, mouth, throat, and face: Denies mucositis or sore throat (+) mouth soars, SLE related Respiratory: Denies cough, dyspnea or wheezes Cardiovascular: Denies palpitation, chest discomfort or lower extremity swelling Gastrointestinal:  Denies heartburn or change in bowel habits (+) intermittent nausea (+) hernia, repaired  GU/GYN: Denies hematuria, dysuria, urgency (+) no more menses LMP 7 months ago Skin: Denies abnormal skin rashes Lymphatics: Denies new lymphadenopathy or easy bruising Neurological:Denies numbness, tingling or new weaknesses  Behavioral/Psych: Mood is stable, no new changes  All other systems were reviewed with the patient and are negative.  MEDICAL HISTORY:  Past Medical History:  Diagnosis Date  . Abnormal uterine bleeding   . Anxiety   . Arthritis    hands, knees, shoulder, back   . Asthma   . Colitis    History of  . Complication of anesthesia 01/1992   During tubal ligation heart stopped and she stopped breathing  . Depression   . Environmental and seasonal allergies   . Fatigue   . Gallstones   . GERD (gastroesophageal reflux disease)   . Heart rate slow    per watch  . History of kidney stones   . History of shingles   . Hypoglycemia   . IBS (irritable bowel syndrome)   . Iron deficiency anemia    receive iron infusion  . Lactose intolerance   . Lupus (HCC)   . Migraine   . Numbness and tingling of both upper extremities    first thing in the morning when she wakes up  . Numbness and tingling of lower extremity    first thing in the morning when she wakes up  . RA (rheumatoid arthritis) (HCC)   . Ventral hernia   . Vertigo     SURGICAL HISTORY: Past Surgical History:  Procedure Laterality Date  . APPENDECTOMY    . CESAREAN SECTION    . CHOLECYSTECTOMY [name] 08/07/2017   Procedure: LAPAROSCOPIC CHOLECYSTECTOMY, PRIMARY REPAIR OF UMBILICAL HERNIA;  Surgeon: Andria Meuse, MD;  Location: [facility];  Service:  General;  Laterality: [name];  . COLONOSCOPY    . IUD REMOVAL [name] 11/12/2014  . TUBAL LIGATION    . UMBILICAL HERNIA REPAIR      I have reviewed the social history and family history with the patient and they are unchanged from previous note.  ALLERGIES:  is allergic to anesthetics, amide; benzocaine; black pepper [piper]; mushroom extract complex; trazodone and nefazodone; amoxicillin; bee venom; benadryl [diphenhydramine]; effexor [venlafaxine]; lactose intolerance (gi); nortriptyline; nsaids; and other.  MEDICATIONS:  Current Outpatient Medications  Medication Sig Dispense Refill  . ibuprofen (ADVIL,MOTRIN) 200 MG tablet Take 400 mg by mouth as needed.    . naproxen (NAPROSYN) 500 MG tablet Take 500 mg by mouth 2 (two) times daily as needed (pain, migraines).      No current facility-administered medications for this visit.     PHYSICAL EXAMINATION: ECOG PERFORMANCE STATUS: 1 - Symptomatic but completely ambulatory Weight 151 pound, blood pressure 111/66, respiratory 20, pulse 55, temperature 98.1, pulse ox 100% on room air GENERAL:alert, no distress and comfortable  SKIN: skin color, texture, turgor are normal, no rashes or significant lesions EYES: normal, Conjunctiva are pink and non-injected, sclera clear OROPHARYNX:no exudate, no erythema; lips and buccal mucosa are normal (+) glossal hyperpigmentation  LYMPH:  no palpable cervical, supraclavicular, or axillary lymphadenopathy LUNGS: clear to auscultation bilaterally with normal breathing effort HEART: regular rate & rhythm and no murmurs and no lower extremity edema ABDOMEN:abdomen soft and normal bowel sounds. (+) mild tenderness on palpation to RUQ  Musculoskeletal:no cyanosis of digits and no clubbing  NEURO: alert & oriented x 3 with fluent speech, no focal motor/sensory deficits  LABORATORY DATA:  I have reviewed the data as listed CBC Latest Ref Rng & Units 02/24/2018 12/16/2017 10/21/2017  WBC 4.0 - 10.5 K/uL 6.4 5.3 6.8   Hemoglobin 12.0 - 15.0 g/dL 15.1(H) 13.5 13.6  Hematocrit 36.0 - 46.0 % 45.4 41.5 41.5  Platelets 150 - 400 K/uL 214 223 234     CMP Latest Ref Rng & Units 08/02/2017 06/23/2017 05/30/2017  Glucose 65 - 99 mg/dL 86 96 81  BUN 6 - 20 mg/dL 11 13 11   Creatinine 0.44 - 1.00 mg/dL 8.29 5.62  Sodium 135 - 145 mmol/L 140 135 142  Potassium 3.5 - 5.1 mmol/L 3.8 3.7 4.3  Chloride 101 - 111 mmol/L 105 107 105  CO2 22 - 32 mmol/L 26 22 20   Calcium 8.9 - 10.3 mg/dL 9.2 1.30) )  Total Protein 6.5 - 8.1 g/dL 7.6 7.4 7.0  Total Bilirubin 0.3 - 1.2 mg/dL 0.3 8.6(V) 7.8(I  Alkaline Phos 38 - 126 U/L 74 88 81  AST 15 - 41 U/L 19 19 18   ALT 14 - 54 U/L 15 14 11    Iron studies  Results for KLYNN, LINNEMANN (MRN <2.9) as of 02/24/2018 10:48  Ref. Range 10/21/2017 08:04 12/16/2017 08:10  Iron Latest Ref Range: 41 - 142 ug/dL [id number] 02/26/2018 (H)  UIBC Latest Units: ug/dL 12/21/2017 02/15/2018  TIBC Latest Ref Range: 236 - 444 ug/dL 010 272  Saturation Ratios Latest Ref Range: 21 - 57 % 34 41  Ferritin Latest Ref Range: 11 - 307 ng/mL 117 127    RADIOGRAPHIC STUDIES: I have personally reviewed the radiological images as listed and agreed with the findings in the report. No results found.   ASSESSMENT & PLAN: Danielle Holden is a 47 year old female with history of iron deficiency anemia, colitis, IBS, lupus, and RA.   1.  Anemia, iron deficiency due to chronic blood loss  -We previously reviewed her medical record in detail.  She has a long-standing history of a hypochromic, microcytic anemia, elevated RDW, and low iron studies, consistent with iron deficiency anemia.   -Anemia is likely secondary to chronic GYN blood loss. She is planning to undergo hysterectomy in the future. -She previously did not respond well to oral iron, and also did not tolerated well due to constipation.  She is currently not on oral iron. -S/p IV Feraheme on 2/27 and 3/6, she tolerated well and responded well. Hgb normalized. Her  fatigue has improved some. -Continues to have heavy menses, Ongoing discussion with GYN for partial hysterectomy. She will f/u with Dr. [phone] for further discussion  -Her hgb is 15.1 today and her MCV is normal as well. Iron studies are pending, I will call with results and recommend IV iron if needed.  -I recommend her to eat iron rich foods since she cannot take oral iron supplement. She can take a multivitamin with iron.  -Lab in 4  months -F/u in 8 months   2. Colitis, IBS -Patient underwent colonoscopy on 11/22/16 that was notable for segmental area of mildly congested, erythematous mucosa in the colon and rectum. Pathology positive for mildly active colitis in the right and left colon with features of chronicity. Otherwise unremarkable exam.  -I encouraged her to avoid NSAIDs due to increased bleeding risk. She agrees.   3. Lupus, RA -She did not f/u with Dr. Sharmon Revere at [facility] as instructed last month; She was given the number and advised to call and f/u with them  4. Cholecystitis, ventral hernia  -s/p cholecystectomy and hernia repair per Dr. Cliffton Asters on 08/07/17  5. Vertigo, diagnosed 2013 -She has intermittent dizziness; she can use over the counter meclizine PRN  PLAN -Labs in 4 months -f/u in 8 months  -consider iv feraheme if ferritin <30 or low iron level  -Rheumatology referral for lupus, per patient's request  All questions were answered. The patient knows to call the clinic with any problems, questions or concerns. No barriers to learning was detected. I spent 15 minutes counseling the patient face to face. The total time spent in the appointment was 20 minutes and more than 50% was on counseling.  Elenor Legato Dweik am acting as scribe for Dr. Malachy Mood.  I have reviewed the above documentation for accuracy and completeness, and I agree with the above.     Malachy Mood, MD 02/24/18 10:47 AM

## 2018-02-24 NOTE — Telephone Encounter (Signed)
Gave pt avs and calendar  °

## 2018-02-25 ENCOUNTER — Encounter: Payer: Self-pay | Admitting: Hematology

## 2018-02-25 LAB — IRON AND TIBC
Iron: 132 ug/dL (ref 41–142)
SATURATION RATIOS: 37 % (ref 21–57)
TIBC: 361 ug/dL (ref 236–444)
UIBC: 229 ug/dL

## 2018-02-25 LAB — FERRITIN: Ferritin: 89 ng/mL (ref 11–307)

## 2018-02-27 ENCOUNTER — Telehealth: Payer: Self-pay

## 2018-02-27 NOTE — Telephone Encounter (Signed)
Spoke with patient regarding lab results, iron studies are good, no need for IV feraheme at this time, keep scheduled lab and f/u appts.  Patient verbalized an understanding.

## 2018-02-27 NOTE — Telephone Encounter (Signed)
-----   Message from Pollyann Samples, NP sent at 02/27/2018  3:00 PM EDT ----- Please let her know iron studies are normal, no need for IV Feraheme. Keep lab and f/u appts.  Thanks, Clayborn Heron NP

## 2018-03-06 DIAGNOSIS — Z760 Encounter for issue of repeat prescription: Secondary | ICD-10-CM | POA: Diagnosis not present

## 2018-03-06 DIAGNOSIS — Z23 Encounter for immunization: Secondary | ICD-10-CM | POA: Diagnosis not present

## 2018-03-06 DIAGNOSIS — H1033 Unspecified acute conjunctivitis, bilateral: Secondary | ICD-10-CM | POA: Diagnosis not present

## 2018-03-06 DIAGNOSIS — J45998 Other asthma: Secondary | ICD-10-CM | POA: Diagnosis not present

## 2018-03-25 ENCOUNTER — Telehealth: Payer: Self-pay

## 2018-03-25 NOTE — Telephone Encounter (Signed)
Faxed demographics and last OV note to [place] at [facility] Fax #[phone]

## 2018-03-31 DIAGNOSIS — Z9889 Other specified postprocedural states: Secondary | ICD-10-CM | POA: Diagnosis not present

## 2018-03-31 DIAGNOSIS — Z8719 Personal history of other diseases of the digestive system: Secondary | ICD-10-CM | POA: Diagnosis not present

## 2018-03-31 DIAGNOSIS — R109 Unspecified abdominal pain: Secondary | ICD-10-CM | POA: Diagnosis not present

## 2018-04-07 ENCOUNTER — Other Ambulatory Visit: Payer: Self-pay | Admitting: Surgery

## 2018-04-07 DIAGNOSIS — Z8719 Personal history of other diseases of the digestive system: Secondary | ICD-10-CM

## 2018-04-07 DIAGNOSIS — Z9889 Other specified postprocedural states: Principal | ICD-10-CM

## 2018-04-14 NOTE — Progress Notes (Deleted)
Office Visit Note  Patient: Danielle Holden             Date of Birth: Apr 08, 1971           MRN: [id number]             PCP: Patient, No Pcp Per Referring: Malachy Mood, MD Visit Date: 04/28/2018 Occupation: @GUAROCC @  Subjective:  No chief complaint on file.   History of Present Illness: Danielle Holden is a 47 y.o. female ***   Activities of Daily Living:  Patient reports morning stiffness for *** {minute/hour:19697}.   Patient {ACTIONS;DENIES/REPORTS:21021675::"Denies"} nocturnal pain.  Difficulty dressing/grooming: {ACTIONS;DENIES/REPORTS:21021675::"Denies"} Difficulty climbing stairs: {ACTIONS;DENIES/REPORTS:21021675::"Denies"} Difficulty getting out of chair: {ACTIONS;DENIES/REPORTS:21021675::"Denies"} Difficulty using hands for taps, buttons, cutlery, and/or writing: {ACTIONS;DENIES/REPORTS:21021675::"Denies"}  No Rheumatology ROS completed.   PMFS History:  Patient Active Problem List   Diagnosis Date Noted  . Primary dysmenorrhea 06/13/2017  . Generalized anxiety disorder 07/26/2014  . Migraine without aura and without status migrainosus, not intractable 07/26/2014  . Hx of migraines 01/27/2014  . Migraine, unspecified, without mention of intractable migraine without mention of status migrainosus 09/28/2013  . Mastodynia, female 07/13/2013  . Abnormal uterine bleeding (AUB) 07/13/2013  . Iron deficiency anemia 06/30/2013  . TIA (transient ischemic attack) 06/27/2013  . Acute right-sided weakness 06/27/2013  . Anemia 06/27/2013  . Menorrhagia 06/27/2013  . Chest pain 06/27/2013    Past Medical History:  Diagnosis Date  . Abnormal uterine bleeding   . Anxiety   . Arthritis    hands, knees, shoulder, back   . Asthma   . Colitis    History of  . Complication of anesthesia 01/1992   During tubal ligation heart stopped and she stopped breathing  . Depression   . Environmental and seasonal allergies   . Fatigue   . Gallstones   . GERD (gastroesophageal  reflux disease)   . Heart rate slow    per watch  . History of kidney stones   . History of shingles   . Hypoglycemia   . IBS (irritable bowel syndrome)   . Iron deficiency anemia    receive iron infusion  . Lactose intolerance   . Lupus (HCC)   . Migraine   . Numbness and tingling of both upper extremities    first thing in the morning when she wakes up  . Numbness and tingling of lower extremity    first thing in the morning when she wakes up  . RA (rheumatoid arthritis) (HCC)   . Ventral hernia   . Vertigo     Family History  Problem Relation Age of Onset  . Suicidality Father   . Diabetes Maternal Grandmother   . Breast cancer Maternal Grandmother   . Cervical cancer Paternal Grandmother    Past Surgical History:  Procedure Laterality Date  . APPENDECTOMY    . CESAREAN SECTION    . CHOLECYSTECTOMY [name] 08/07/2017   Procedure: LAPAROSCOPIC CHOLECYSTECTOMY, PRIMARY REPAIR OF UMBILICAL HERNIA;  Surgeon: 08/09/2017, MD;  Location: [facility];  Service: General;  Laterality: [name];  . COLONOSCOPY    . IUD REMOVAL [name] 11/12/2014  . TUBAL LIGATION    . UMBILICAL HERNIA REPAIR     Social History   Social History Narrative   Patient lives at home alone and she is disabled.   Education 9th grade.   Right handed.   Caffeine one cup of coffee not daily.     Objective: Vital Signs: There were no vitals taken  for this visit.   Physical Exam   Musculoskeletal Exam: ***  CDAI Exam: CDAI Score: Not documented Patient Global Assessment: Not documented; Provider Global Assessment: Not documented Swollen: Not documented; Tender: Not documented Joint Exam   Not documented   There is currently no information documented on the homunculus. Go to the Rheumatology activity and complete the homunculus joint exam.  Investigation: No additional findings. Component     Latest Ref Rng & Units 02/24/2018  Iron     41 - 142 ug/dL 161  TIBC     096 - 045 ug/dL 409    Saturation Ratios     21 - 57 % 37  UIBC     ug/dL 811  Retic Ct Pct     0.4 - 3.1 % 1.7  RBC.     3.87 - 5.11 MIL/uL 4.74  Retic Count, Absolute     19.0 - 186.0 K/uL 79.2  Immature Retic Fract     2.3 - 15.9 % 10.2  Ferritin     11 - 307 ng/mL 89   Imaging: No results found.  Recent Labs: Lab Results  Component Value Date   WBC 6.4 02/24/2018   HGB 15.1 (H) 02/24/2018   PLT 214 02/24/2018   NA 140 08/02/2017   K 3.8 08/02/2017   CL 105 08/02/2017   CO2 26 08/02/2017   GLUCOSE 86 08/02/2017   BUN 11 08/02/2017   CREATININE 0.70 08/02/2017   BILITOT 0.3 08/02/2017   ALKPHOS 74 08/02/2017   AST 19 08/02/2017   ALT 15 08/02/2017   PROT 7.6 08/02/2017   ALBUMIN 4.2 08/02/2017   CALCIUM 9.2 08/02/2017   GFRAA >60 08/02/2017    Speciality Comments: No specialty comments available.  Procedures:  No procedures performed Allergies: Anesthetics, amide; Benzocaine; Black pepper [piper]; Mushroom extract complex; Trazodone and nefazodone; Amoxicillin; Bee venom; Benadryl [diphenhydramine]; Effexor [venlafaxine]; Lactose intolerance (gi); Nortriptyline; Nsaids; and Other   Assessment / Plan:     Visit Diagnoses: Iron deficiency anemia due to chronic blood loss  History of TIA (transient ischemic attack)  Migraine without aura and without status migrainosus, not intractable  History of asthma  History of gastroesophageal reflux (GERD)  History of IBS  History of shingles  History of kidney stones  Anxiety and depression   Orders: No orders of the defined types were placed in this encounter.  No orders of the defined types were placed in this encounter.   Face-to-face time spent with patient was *** minutes. Greater than 50% of time was spent in counseling and coordination of care.  Follow-Up Instructions: No follow-ups on file.   Gearldine Bienenstock, PA-C  Note - This record has been created using Dragon software.  Chart creation errors have been sought,  but may not always  have been located. Such creation errors do not reflect on  the standard of medical care.

## 2018-04-16 ENCOUNTER — Ambulatory Visit
Admission: RE | Admit: 2018-04-16 | Discharge: 2018-04-16 | Disposition: A | Discharge status: Home / Self Care | Payer: Medicaid Other | Source: Ambulatory Visit | Attending: Surgery | Admitting: Surgery

## 2018-04-16 DIAGNOSIS — Z9889 Other specified postprocedural states: Principal | ICD-10-CM

## 2018-04-16 DIAGNOSIS — K429 Umbilical hernia without obstruction or gangrene: Secondary | ICD-10-CM | POA: Diagnosis not present

## 2018-04-16 DIAGNOSIS — Z8719 Personal history of other diseases of the digestive system: Secondary | ICD-10-CM

## 2018-04-16 MED ORDER — IOHEXOL 300 MG/ML  SOLN
100.0000 mL | Freq: Once | INTRAMUSCULAR | Status: AC | PRN
  Administered 2018-04-16: 100 mL via INTRAVENOUS

## 2018-04-28 ENCOUNTER — Ambulatory Visit: Payer: Self-pay | Admitting: Rheumatology

## 2018-05-09 NOTE — Progress Notes (Signed)
Office Visit Note  Patient: Danielle Holden             Date of Birth: 02-11-1971           MRN: [id number]             PCP: Patient, No Pcp Per Referring: Malachy Mood, MD Visit Date: 05/21/2018 Occupation: Nurse aid, currently not working  Subjective:  Pain in multiple joints.   History of Present Illness: Danielle Holden is a 47 y.o. female seen in consultation per request of her PCP.  According to patient her symptoms started at age 3 with joint pain and rash.  She states she also had these symptoms each time she was postpartum.  She states her labs came positive for possible lupus but no treatment was given.  After that she moved to [place] and at age 85 she developed a rash on her extremities which she describes on her hands and feet and severe pain all over.  She was also experiencing abdominal pain.  She states she was diagnosed with possible lupus based on the lab work and was given anti-inflammatories.  At the time she was not seen by rheumatologist.  6 years ago she moved to [facility] and since then she has seen to rheumatologist for joint pain rash and rectal bleeding.  She states she is in the emergency room she was diagnosed with an inflammatory bowel syndrome.  The rheumatologist at work-up and no treatment was given.  She states 1 year ago she used hair dye and developed swelling all over and then stopped using the hair dye.  She continues to have pain and discomfort in multiple joints which she describes in her both hands both knees and her right hip joint.  She is also variances generalized pain.  Activities of Daily Living:  Patient reports morning stiffness for 4-5 hours.   Patient Reports nocturnal pain.  Difficulty dressing/grooming: Denies Difficulty climbing stairs: Reports Difficulty getting out of chair: Reports Difficulty using hands for taps, buttons, cutlery, and/or writing: Reports  Review of Systems  Constitutional: Positive for fatigue. Negative for night  sweats, weight gain and weight loss.  HENT: Positive for mouth sores and mouth dryness. Negative for trouble swallowing, trouble swallowing and nose dryness.   Eyes: Negative for pain, redness, visual disturbance and dryness.  Respiratory: Negative for cough, shortness of breath and difficulty breathing.   Cardiovascular: Positive for irregular heartbeat. Negative for chest pain, palpitations, hypertension and swelling in legs/feet.       Anxiety  Gastrointestinal: Positive for constipation and diarrhea. Negative for blood in stool.  Endocrine: Negative for increased urination.  Genitourinary: Negative for vaginal dryness.  Musculoskeletal: Positive for arthralgias, joint pain and morning stiffness. Negative for joint swelling, myalgias, muscle weakness, muscle tenderness and myalgias.  Skin: Positive for color change and sensitivity to sunlight. Negative for rash, hair loss, skin tightness and ulcers.  Allergic/Immunologic: Negative for susceptible to infections.  Neurological: Negative for dizziness, memory loss, night sweats and weakness.  Hematological: Positive for swollen glands.  Psychiatric/Behavioral: Positive for depressed mood and sleep disturbance. The patient is nervous/anxious.     PMFS History:  Patient Active Problem List   Diagnosis Date Noted  . Primary dysmenorrhea 06/13/2017  . Generalized anxiety disorder 07/26/2014  . Migraine without aura and without status migrainosus, not intractable 07/26/2014  . Hx of migraines 01/27/2014  . Migraine, unspecified, without mention of intractable migraine without mention of status migrainosus 09/28/2013  . Mastodynia,  female 07/13/2013  . Abnormal uterine bleeding (AUB) 07/13/2013  . Iron deficiency anemia 06/30/2013  . TIA (transient ischemic attack) 06/27/2013  . Acute right-sided weakness 06/27/2013  . Anemia 06/27/2013  . Menorrhagia 06/27/2013  . Chest pain 06/27/2013    Past Medical History:  Diagnosis Date  .  Abnormal uterine bleeding   . Anxiety   . Arthritis    hands, knees, shoulder, back   . Asthma   . Colitis    History of  . Complication of anesthesia 01/1992   During tubal ligation heart stopped and she stopped breathing  . Depression   . Environmental and seasonal allergies   . Fatigue   . Gallstones   . GERD (gastroesophageal reflux disease)   . Heart rate slow    per watch  . History of kidney stones   . History of shingles   . Hypoglycemia   . IBS (irritable bowel syndrome)   . Iron deficiency anemia    receive iron infusion  . Lactose intolerance   . Lupus (HCC)   . Migraine   . Numbness and tingling of both upper extremities    first thing in the morning when she wakes up  . Numbness and tingling of lower extremity    first thing in the morning when she wakes up  . RA (rheumatoid arthritis) (HCC)   . Ventral hernia   . Vertigo     Family History  Problem Relation Age of Onset  . Suicidality Father   . Diabetes Maternal Grandmother   . Breast cancer Maternal Grandmother   . Cervical cancer Paternal Grandmother   . Heart defect Son   . Healthy Son   . Healthy Daughter    Past Surgical History:  Procedure Laterality Date  . APPENDECTOMY    . CESAREAN SECTION    . CHOLECYSTECTOMY [name] 08/07/2017   Procedure: LAPAROSCOPIC CHOLECYSTECTOMY, PRIMARY REPAIR OF UMBILICAL HERNIA;  Surgeon: Andria Meuse, MD;  Location: [facility];  Service: General;  Laterality: [name];  . COLONOSCOPY    . IUD REMOVAL [name] 11/12/2014  . TUBAL LIGATION    . UMBILICAL HERNIA REPAIR     Social History   Social History Narrative   Patient lives at home alone and she is disabled.   Education 9th grade.   Right handed.   Caffeine one cup of coffee not daily.     Objective: Vital Signs: BP 112/76 (BP Location: Right Arm, Patient Position: Sitting, Cuff Size: Normal)   Pulse 61   Resp 13   Ht 5\' 3"  (1.6 m)   Wt 150 lb 6.4 oz (68.2 kg)   BMI 26.64 kg/m    Physical Exam Vitals  signs and nursing note reviewed.  Constitutional:      Appearance: She is well-developed.  HENT:     Head: Normocephalic and atraumatic.  Eyes:     Conjunctiva/sclera: Conjunctivae normal.  Neck:     Musculoskeletal: Normal range of motion.  Cardiovascular:     Rate and Rhythm: Normal rate and regular rhythm.     Heart sounds: Normal heart sounds.  Pulmonary:     Effort: Pulmonary effort is normal.     Breath sounds: Normal breath sounds.  Abdominal:     General: Bowel sounds are normal.     Palpations: Abdomen is soft.  Lymphadenopathy:     Cervical: No cervical adenopathy.  Skin:    General: Skin is warm and dry.     Capillary Refill: Capillary refill  takes less than 2 seconds.  Neurological:     Mental Status: She is alert and oriented to person, place, and time.  Psychiatric:        Behavior: Behavior normal.      Musculoskeletal Exam: C-spine thoracic lumbar spine good range of motion.  Shoulder joints elbow joints wrist joint MCPs PIPs DIPs with good range of motion with no synovitis.  She has some tenderness across the PIPs of her hands.  Is been full range of motion of bilateral hip joints and knee joints.  No warmth swelling or effusion was noted.  No PIP or DIP synovitis was noted on examination of her feet.  CDAI Exam: CDAI Score: Not documented Patient Global Assessment: Not documented; Provider Global Assessment: Not documented Swollen: Not documented; Tender: Not documented Joint Exam   Not documented   There is currently no information documented on the homunculus. Go to the Rheumatology activity and complete the homunculus joint exam.  Investigation: No additional findings. Component     Latest Ref Rng & Units 02/24/2018  Iron     41 - 142 ug/dL 161  TIBC     096 - 045 ug/dL 409  Saturation Ratios     21 - 57 % 37  UIBC     ug/dL 811  Retic Ct Pct     0.4 - 3.1 % 1.7  RBC.     3.87 - 5.11 MIL/uL 4.74  Retic Count, Absolute     19.0 - 186.0  K/uL 79.2  Immature Retic Fract     2.3 - 15.9 % 10.2  Ferritin     11 - 307 ng/mL 89   Imaging: Xr Hip Unilat W Or W/o Pelvis 2-3 Views Left  Result Date: 05/21/2018 No hip joint narrowing was noted.  No chondrocalcinosis was noted. Impression: Unremarkable x-ray of the hip joint.  Xr Hip Unilat W Or W/o Pelvis 2-3 Views Right  Result Date: 05/21/2018 No hip joint narrowing was noted.  No chondrocalcinosis was noted. Impression: Unremarkable x-ray of the hip joint.  Xr Hand 2 View Left  Result Date: 05/21/2018 No MCP PIP DIP narrowing was noted.  No intercarpal or radiocarpal joint space narrowing was noted.  No erosive changes were noted. Impression: Unremarkable x-ray of the hand.  Xr Hand 2 View Right  Result Date: 05/21/2018 No MCP PIP DIP narrowing was noted.  No intercarpal or radiocarpal joint space narrowing was noted.  No erosive changes were noted. Impression: Unremarkable x-ray of the hand.  Xr Knee 3 View Left  Result Date: 05/21/2018 No medial lateral compartment narrowing was noted.  No chondrocalcinosis was noted.  No patellofemoral narrowing was noted. Impression: Unremarkable x-ray of the knee joint.  Xr Knee 3 View Right  Result Date: 05/21/2018 No medial lateral compartment narrowing was noted.  No chondrocalcinosis was noted.  No patellofemoral narrowing was noted. Impression: Unremarkable x-ray of the knee joint.   Recent Labs: Lab Results  Component Value Date   WBC 6.4 02/24/2018   HGB 15.1 (H) 02/24/2018   PLT 214 02/24/2018   NA 140 08/02/2017   K 3.8 08/02/2017   CL 105 08/02/2017   CO2 26 08/02/2017   GLUCOSE 86 08/02/2017   BUN 11 08/02/2017   CREATININE 0.70 08/02/2017   BILITOT 0.3 08/02/2017   ALKPHOS 74 08/02/2017   AST 19 08/02/2017   ALT 15 08/02/2017   PROT 7.6 08/02/2017   ALBUMIN 4.2 08/02/2017   CALCIUM 9.2 08/02/2017   GFRAA >  60 08/02/2017    Speciality Comments: No specialty comments available.  Procedures:  No procedures  performed Allergies: Anesthetics, amide; Benzocaine; Black pepper [piper]; Mushroom extract complex; Trazodone and nefazodone; Amoxicillin; Bee venom; Benadryl [diphenhydramine]; Effexor [venlafaxine]; Lactose intolerance (gi); Nortriptyline; Nsaids; and Other   Assessment / Plan:     Visit Diagnoses: Pain in both hands -patient gives history of recurrent swelling in her hands for over 20 years.  She had some tenderness over PIPs but no synovitis was noted.  Patient states that she was diagnosed with lupus 3 times in the past but no treatment was offered as her lupus was mild.  I do not see any clinical features of lupus on examination today.  I will obtain AVISE labs today.  Plan: XR Hand 2 View Right, XR Hand 2 View Left.  X-ray of bilateral hands were unremarkable.  Chronic pain of both hips - Plan: XR HIP UNILAT W OR W/O PELVIS 2-3 VIEWS RIGHT, XR HIP UNILAT W OR W/O PELVIS 2-3 VIEWS LEFT.  X-ray of bilateral hip joints were unremarkable.  She does have some tenderness over trochanteric bursa which could be causing discomfort in the hip region.  Chronic pain of both knees -he complains of recurrent pain and swelling in her knee joints.  No warmth swelling or effusion was noted.  Plan: XR KNEE 3 VIEW RIGHT, XR KNEE 3 VIEW LEFT.  The x-ray of bilateral knee joints were unremarkable. Other fatigue -she has been experiencing a lot of fatigue.  Plan: COMPLETE METABOLIC PANEL WITH GFR, CK, TSH, Sedimentation rate, Serum protein electrophoresis with reflex, Glucose 6 phosphate dehydrogenase  Myofascial pain -she has generalized pain discomfort and positive tender points.  Fibromyalgia will be a diagnosis of exclusion.  Plan: Urinalysis, Routine w reflex microscopic  Anxiety and depression-she has longstanding history of anxiety and depression.  History of IBS-she gives history of IBS for many years.  Patient has history of intermittent GI bleed.  There is a mention of possible nonspecific colitis on  colonoscopy in the past.  History of gastroesophageal reflux (GERD)  History of TIA (transient ischemic attack)  Hx of migraines  History of asthma  History of kidney stones   Orders: Orders Placed This Encounter  Procedures  . XR Hand 2 View Right  . XR Hand 2 View Left  . XR HIP UNILAT W OR W/O PELVIS 2-3 VIEWS RIGHT  . XR HIP UNILAT W OR W/O PELVIS 2-3 VIEWS LEFT  . XR KNEE 3 VIEW RIGHT  . XR KNEE 3 VIEW LEFT  . COMPLETE METABOLIC PANEL WITH GFR  . Urinalysis, Routine w reflex microscopic  . CK  . TSH  . Sedimentation rate  . Serum protein electrophoresis with reflex  . Glucose 6 phosphate dehydrogenase   No orders of the defined types were placed in this encounter.   Face-to-face time spent with patient was 50 minutes. Greater than 50% of time was spent in counseling and coordination of care.  Follow-Up Instructions: Return for Polyarthralgia and myalgia.   Pollyann Savoy, MD  Note - This record has been created using Animal nutritionist.  Chart creation errors have been sought, but may not always  have been located. Such creation errors do not reflect on  the standard of medical care.

## 2018-05-21 ENCOUNTER — Encounter: Payer: Self-pay | Admitting: Rheumatology

## 2018-05-21 ENCOUNTER — Ambulatory Visit (INDEPENDENT_AMBULATORY_CARE_PROVIDER_SITE_OTHER): Payer: Self-pay

## 2018-05-21 ENCOUNTER — Ambulatory Visit (INDEPENDENT_AMBULATORY_CARE_PROVIDER_SITE_OTHER): Payer: Medicaid Other

## 2018-05-21 ENCOUNTER — Ambulatory Visit: Payer: Medicaid Other | Admitting: Rheumatology

## 2018-05-21 VITALS — BP 112/76 | HR 61 | Resp 13 | Ht 63.0 in | Wt 150.4 lb

## 2018-05-21 DIAGNOSIS — M25552 Pain in left hip: Secondary | ICD-10-CM | POA: Diagnosis not present

## 2018-05-21 DIAGNOSIS — M79641 Pain in right hand: Secondary | ICD-10-CM

## 2018-05-21 DIAGNOSIS — M7918 Myalgia, other site: Secondary | ICD-10-CM | POA: Diagnosis not present

## 2018-05-21 DIAGNOSIS — G8929 Other chronic pain: Secondary | ICD-10-CM

## 2018-05-21 DIAGNOSIS — M25562 Pain in left knee: Secondary | ICD-10-CM

## 2018-05-21 DIAGNOSIS — M25561 Pain in right knee: Secondary | ICD-10-CM

## 2018-05-21 DIAGNOSIS — Z8669 Personal history of other diseases of the nervous system and sense organs: Secondary | ICD-10-CM

## 2018-05-21 DIAGNOSIS — Z8719 Personal history of other diseases of the digestive system: Secondary | ICD-10-CM | POA: Diagnosis not present

## 2018-05-21 DIAGNOSIS — M79642 Pain in left hand: Secondary | ICD-10-CM

## 2018-05-21 DIAGNOSIS — Z8673 Personal history of transient ischemic attack (TIA), and cerebral infarction without residual deficits: Secondary | ICD-10-CM | POA: Diagnosis not present

## 2018-05-21 DIAGNOSIS — M25551 Pain in right hip: Secondary | ICD-10-CM | POA: Diagnosis not present

## 2018-05-21 DIAGNOSIS — Z8709 Personal history of other diseases of the respiratory system: Secondary | ICD-10-CM | POA: Diagnosis not present

## 2018-05-21 DIAGNOSIS — F419 Anxiety disorder, unspecified: Secondary | ICD-10-CM | POA: Diagnosis not present

## 2018-05-21 DIAGNOSIS — F329 Major depressive disorder, single episode, unspecified: Secondary | ICD-10-CM

## 2018-05-21 DIAGNOSIS — F32A Depression, unspecified: Secondary | ICD-10-CM

## 2018-05-21 DIAGNOSIS — Z87442 Personal history of urinary calculi: Secondary | ICD-10-CM | POA: Diagnosis not present

## 2018-05-21 DIAGNOSIS — R5383 Other fatigue: Secondary | ICD-10-CM

## 2018-05-23 LAB — URINALYSIS, ROUTINE W REFLEX MICROSCOPIC
Bilirubin Urine: NEGATIVE
GLUCOSE, UA: NEGATIVE
HGB URINE DIPSTICK: NEGATIVE
Ketones, ur: NEGATIVE
LEUKOCYTES UA: NEGATIVE
Nitrite: NEGATIVE
PROTEIN: NEGATIVE
Specific Gravity, Urine: 1.011 (ref 1.001–1.03)
pH: 5.5 (ref 5.0–8.0)

## 2018-05-23 LAB — COMPLETE METABOLIC PANEL WITH GFR
AG RATIO: 1.6 (calc) (ref 1.0–2.5)
ALKALINE PHOSPHATASE (APISO): 92 U/L (ref 33–115)
ALT: 9 U/L (ref 6–29)
AST: 14 U/L (ref 10–35)
Albumin: 4.2 g/dL (ref 3.6–5.1)
BILIRUBIN TOTAL: 0.5 mg/dL (ref 0.2–1.2)
BUN: 10 mg/dL (ref 7–25)
CHLORIDE: 104 mmol/L (ref 98–110)
CO2: 25 mmol/L (ref 20–32)
Calcium: 8.6 mg/dL (ref 8.6–10.2)
Creat: 0.7 mg/dL (ref 0.50–1.10)
GFR, EST AFRICAN AMERICAN: 118 mL/min/{1.73_m2} (ref >=60)
GFR, Est Non African American: 102 mL/min/{1.73_m2} (ref >=60)
Globulin: 2.7 g/dL (calc) (ref 1.9–3.7)
Glucose, Bld: 70 mg/dL (ref 65–99)
POTASSIUM: 3.6 mmol/L (ref 3.5–5.3)
Sodium: 139 mmol/L (ref 135–146)
Total Protein: 6.9 g/dL (ref 6.1–8.1)

## 2018-05-23 LAB — CK: Total CK: 84 U/L (ref 29–143)

## 2018-05-23 LAB — TSH: TSH: 1.55 mIU/L

## 2018-05-23 LAB — SEDIMENTATION RATE: SED RATE: 9 mm/h (ref 0–20)

## 2018-05-23 LAB — PROTEIN ELECTROPHORESIS, SERUM, WITH REFLEX
ALBUMIN ELP: 4.1 g/dL (ref 3.8–4.8)
ALPHA 1: 0.3 g/dL (ref 0.2–0.3)
ALPHA 2: 0.7 g/dL (ref 0.5–0.9)
BETA 2: 0.4 g/dL (ref 0.2–0.5)
BETA GLOBULIN: 0.5 g/dL (ref 0.4–0.6)
Gamma Globulin: 1.1 g/dL (ref 0.8–1.7)
Total Protein: 7.1 g/dL (ref 6.1–8.1)

## 2018-05-23 LAB — GLUCOSE 6 PHOSPHATE DEHYDROGENASE: G-6PDH: 15.9 U/g{Hb} (ref 7.0–20.5)

## 2018-05-28 NOTE — Progress Notes (Addendum)
Office Visit Note  Patient: Danielle Holden             Date of Birth: June 25, 1970           MRN: [id number]             PCP: Patient, No Pcp Per Referring: No ref. provider found Visit Date: 06/11/2018 Occupation: _0 @  Subjective:  Fatigue   History of Present Illness: Danielle Holden is a 48 y.o. female with history of positive ANA and myofascial pain syndrome.  She continues to have generalized muscle aches and muscle tenderness.  She continues to have chronic fatigue and difficulty sleeping at night.  She states she has pain in both knee joints and both hands.  She states she has swelling in the right knee joint at times.  She denies any recent rashes.  She denies any worsening hair loss.  She reports sun sensitivity.  She has been having sores in her mouth but none in her nose.  She reports symptoms of Raynaud's but no digital ulcerations.  She wears gloves on a regular basis.  She reports sicca symptoms.   Activities of Daily Living:  Patient reports morning stiffness all day.   Patient Reports nocturnal pain.  Difficulty dressing/grooming: Reports Difficulty climbing stairs: Reports Difficulty getting out of chair: Denies Difficulty using hands for taps, buttons, cutlery, and/or writing: Reports  Review of Systems  Constitutional: Positive for fatigue.  HENT: Positive for mouth sores and mouth dryness. Negative for nose dryness.   Eyes: Positive for dryness. Negative for pain, itching and visual disturbance.  Respiratory: Negative for cough, hemoptysis, shortness of breath, wheezing and difficulty breathing.   Cardiovascular: Negative for chest pain, palpitations, hypertension and swelling in legs/feet.  Gastrointestinal: Positive for diarrhea. Negative for abdominal pain, blood in stool and constipation.  Endocrine: Negative for increased urination.  Genitourinary: Negative for painful urination, nocturia and pelvic pain.  Musculoskeletal: Positive for arthralgias,  joint pain, joint swelling and morning stiffness. Negative for myalgias, muscle weakness, muscle tenderness and myalgias.  Skin: Negative for color change, pallor, rash, hair loss, nodules/bumps, redness, skin tightness, ulcers and sensitivity to sunlight.  Allergic/Immunologic: Negative for susceptible to infections.  Neurological: Positive for headaches (Hx of migraines). Negative for dizziness, light-headedness and numbness.  Hematological: Negative for swollen glands.  Psychiatric/Behavioral: Negative for depressed mood and sleep disturbance. The patient is not nervous/anxious.     PMFS History:  Patient Active Problem List   Diagnosis Date Noted  . History of kidney stones 05/29/2018  . History of asthma 05/29/2018  . Myofascial pain 05/29/2018  . History of IBS 05/29/2018  . History of gastroesophageal reflux (GERD) 05/29/2018  . History of TIA (transient ischemic attack) 05/29/2018  . Primary dysmenorrhea 06/13/2017  . Generalized anxiety disorder 07/26/2014  . Migraine without aura and without status migrainosus, not intractable 07/26/2014  . Hx of migraines 01/27/2014  . Migraine, unspecified, without mention of intractable migraine without mention of status migrainosus 09/28/2013  . Mastodynia, female 07/13/2013  . Abnormal uterine bleeding (AUB) 07/13/2013  . Iron deficiency anemia 06/30/2013  . TIA (transient ischemic attack) 06/27/2013  . Acute right-sided weakness 06/27/2013  . Anemia 06/27/2013  . Menorrhagia 06/27/2013  . Chest pain 06/27/2013    Past Medical History:  Diagnosis Date  . Abnormal uterine bleeding   . Anxiety   . Arthritis    hands, knees, shoulder, back   . Asthma   . Colitis    History of  .  Complication of anesthesia 01/1992   During tubal ligation heart stopped and she stopped breathing  . Depression   . Environmental and seasonal allergies   . Fatigue   . Gallstones   . GERD (gastroesophageal reflux disease)   . Heart rate slow     per watch  . History of kidney stones   . History of shingles   . Hypoglycemia   . IBS (irritable bowel syndrome)   . Iron deficiency anemia    receive iron infusion  . Lactose intolerance   . Lupus ([place])   . Migraine   . Numbness and tingling of both upper extremities    first thing in the morning when she wakes up  . Numbness and tingling of lower extremity    first thing in the morning when she wakes up  . RA (rheumatoid arthritis) ([place])   . Ventral hernia   . Vertigo     Family History  Problem Relation Age of Onset  . Suicidality Father   . Diabetes Maternal Grandmother   . Breast cancer Maternal Grandmother   . Cervical cancer Paternal Grandmother   . Heart defect Son   . Healthy Son   . Healthy Daughter    Past Surgical History:  Procedure Laterality Date  . APPENDECTOMY    . CESAREAN SECTION    . CHOLECYSTECTOMY [name] 08/07/2017   Procedure: LAPAROSCOPIC CHOLECYSTECTOMY, PRIMARY REPAIR OF UMBILICAL HERNIA;  Surgeon: Ileana Roup, MD;  Location: [facility];  Service: General;  Laterality: [name];  . COLONOSCOPY    . IUD REMOVAL [name] 11/12/2014  . TUBAL LIGATION    . UMBILICAL HERNIA REPAIR     Social History   Social History Narrative   Patient lives at home alone and she is disabled.   Education 9th grade.   Right handed.   Caffeine one cup of coffee not daily.     There is no immunization history on file for this patient.   Objective: Vital Signs: BP 115/73 (BP Location: Left Arm, Patient Position: Sitting, Cuff Size: Normal)   Pulse 74   Temp (!) 97.1 F (36.2 C) (Oral)   Resp 13   Ht 5' 3" (1.6 m)   Wt 154 lb (69.9 kg)   BMI 27.28 kg/m    Physical Exam Vitals signs and nursing note reviewed.  Constitutional:      Appearance: She is well-developed.  HENT:     Head: Normocephalic and atraumatic.     Comments: No parotid swelling or tenderness on exam.    Nose:     Comments: No nasal ulcerations. Eyes:     Conjunctiva/sclera: Conjunctivae  normal.  Neck:     Musculoskeletal: Normal range of motion.  Cardiovascular:     Rate and Rhythm: Normal rate and regular rhythm.     Heart sounds: Normal heart sounds.  Pulmonary:     Effort: Pulmonary effort is normal.     Breath sounds: Normal breath sounds.  Abdominal:     General: Bowel sounds are normal.     Palpations: Abdomen is soft.  Lymphadenopathy:     Cervical: No cervical adenopathy.  Skin:    General: Skin is warm and dry.     Capillary Refill: Capillary refill takes less than 2 seconds.     Comments: No Maller rash noted.  No digital ulcerations or signs of gangrene.  Neurological:     Mental Status: She is alert and oriented to person, place, and time.  Psychiatric:  Behavior: Behavior normal.      Musculoskeletal Exam: C-spine, thoracic spine, lumbar spine good range of motion.  No midline spinal tenderness.  No SI joint tenderness.  Shoulder joints, with joints, wrist joints, MCPs and PIPs and DIPs good range of motion with no synovitis.  She has complete fist formation bilaterally.  Hip joints good range of motion with no discomfort.  She has tenderness over the right trochanteric bursa on exam.  Knee joints good ROM with no warmth or effusion.  No tenderness or swelling of ankle joints.  CDAI Exam: CDAI Score: Not documented Patient Global Assessment: Not documented; Provider Global Assessment: Not documented Swollen: Not documented; Tender: Not documented Joint Exam   Not documented   There is currently no information documented on the homunculus. Go to the Rheumatology activity and complete the homunculus joint exam.  Investigation: No additional findings.  Imaging: Xr Hip Unilat W Or W/o Pelvis 2-3 Views Left  Result Date: 05/21/2018 No hip joint narrowing was noted.  No chondrocalcinosis was noted. Impression: Unremarkable x-ray of the hip joint.  Xr Hip Unilat W Or W/o Pelvis 2-3 Views Right  Result Date: 05/21/2018 No hip joint narrowing  was noted.  No chondrocalcinosis was noted. Impression: Unremarkable x-ray of the hip joint.  Xr Hand 2 View Left  Result Date: 05/21/2018 No MCP PIP DIP narrowing was noted.  No intercarpal or radiocarpal joint space narrowing was noted.  No erosive changes were noted. Impression: Unremarkable x-ray of the hand.  Xr Hand 2 View Right  Result Date: 05/21/2018 No MCP PIP DIP narrowing was noted.  No intercarpal or radiocarpal joint space narrowing was noted.  No erosive changes were noted. Impression: Unremarkable x-ray of the hand.  Xr Knee 3 View Left  Result Date: 05/21/2018 No medial lateral compartment narrowing was noted.  No chondrocalcinosis was noted.  No patellofemoral narrowing was noted. Impression: Unremarkable x-ray of the knee joint.  Xr Knee 3 View Right  Result Date: 05/21/2018 No medial lateral compartment narrowing was noted.  No chondrocalcinosis was noted.  No patellofemoral narrowing was noted. Impression: Unremarkable x-ray of the knee joint.   Recent Labs: Lab Results  Component Value Date   WBC 6.4 02/24/2018   HGB 15.1 (H) 02/24/2018   PLT 214 02/24/2018   NA 139 05/21/2018   K 3.6 05/21/2018   CL 104 05/21/2018   CO2 25 05/21/2018   GLUCOSE 70 05/21/2018   BUN 10 05/21/2018   CREATININE 0.70 05/21/2018   BILITOT 0.5 05/21/2018   ALKPHOS 74 08/02/2017   AST 14 05/21/2018   ALT 9 05/21/2018   PROT 6.9 05/21/2018   PROT 7.1 05/21/2018   ALBUMIN 4.2 08/02/2017   CALCIUM 8.6 05/21/2018   GFRAA 118 05/21/2018  May 21, 2018 UA negative, CK normal, TSH normal, ESR normal, SPEP negative, G6PD normal January 2020 AVISE index -0.9, ANA 1: 160 nuclear speckled, ENA negative, CB CAP negative, anticardiolipin IgG 22, beta-2 GP 1-, RF negative, anti-CCP negative antithyroglobulin negative, antithyroid peroxidase negative  Speciality Comments: No specialty comments available.  Procedures:  No procedures performed Allergies: Anesthetics, amide; Benzocaine;  Black pepper [piper]; Mushroom extract complex; Trazodone and nefazodone; Amoxicillin; Bee venom; Benadryl [diphenhydramine]; Effexor [venlafaxine]; Lactose intolerance (gi); Nortriptyline; Nsaids; and Other   Assessment / Plan:     Visit Diagnoses: Positive ANA (antinuclear antibody) - ANA 1: 160 speckled, anticardiolipin IgG 22 , AVISE index -0.9: She has no features of autoimmune disease on exam.  Lab work was reviewed  with her in the office today.  All questions were addressed.  Anticardiolipin IgG was weakly positive.  We discussed taking aspirin 81 mg by mouth daily.  She is unsure if she is going to start taking aspirin due to frequent bruising.  There is no need for immunosuppression at this time.  She is advised to notify us if she develops any new or worsening symptoms.  She will follow-up in the office in 1 year.  We will recheck labs at that time.  Trochanteric bursitis of both hips: She has tenderness over the right trochanteric bursa on exam.  She was encouraged to perform stretching exercises on a regular basis.  Myofascial pain: She has generalized hyperalgesia on exam.  She has been experiencing generalized muscle aches and muscle tenderness.  She continues to have chronic fatigue and insomnia.  We discussed the importance of good sleep hygiene and regular exercise.  We also discussed trying to take melatonin at bedtime to help with insomnia.  She was given a list of natural anti-inflammatories that she can try taking as well.  Other medical conditions are listed as follows:  Anxiety and depression  History of IBS  History of gastroesophageal reflux (GERD)  History of TIA (transient ischemic attack)  Hx of migraines  History of asthma  History of kidney stones   Orders: No orders of the defined types were placed in this encounter.  No orders of the defined types were placed in this encounter.    Follow-Up Instructions: Return in about 1 year (around 06/12/2019) for  Positive ANA, Myofascial pain syndrome .   Ofilia Neas, PA-C I examined and evaluated the patient with Hazel Sams PA.  We had detailed discussion regarding the lab work.  She has low titer positive ANA but no clinical features of autoimmune disease.  Her ENA panel was negative.  She has very low titer anticardiolipin antibody which is not significant.  Her examination and clinical findings were consistent with myofascial pain syndrome.  Need for regular exercise and good sleep hygiene was discussed.  The plan of care was discussed as noted above.  Bo Merino, MD Note - This record has been created using Editor, commissioning.  Chart creation errors have been sought, but may not always  have been located. Such creation errors do not reflect on  the standard of medical care.

## 2018-05-29 ENCOUNTER — Ambulatory Visit: Payer: Self-pay | Admitting: Rheumatology

## 2018-05-29 DIAGNOSIS — J4530 Mild persistent asthma, uncomplicated: Secondary | ICD-10-CM | POA: Insufficient documentation

## 2018-05-29 DIAGNOSIS — Z87442 Personal history of urinary calculi: Secondary | ICD-10-CM

## 2018-05-29 DIAGNOSIS — Z8673 Personal history of transient ischemic attack (TIA), and cerebral infarction without residual deficits: Secondary | ICD-10-CM | POA: Insufficient documentation

## 2018-05-29 DIAGNOSIS — Z8709 Personal history of other diseases of the respiratory system: Secondary | ICD-10-CM | POA: Insufficient documentation

## 2018-05-29 DIAGNOSIS — M7918 Myalgia, other site: Secondary | ICD-10-CM | POA: Insufficient documentation

## 2018-05-29 DIAGNOSIS — Z8719 Personal history of other diseases of the digestive system: Secondary | ICD-10-CM | POA: Insufficient documentation

## 2018-05-29 HISTORY — DX: Mild persistent asthma, uncomplicated: J45.30

## 2018-05-29 HISTORY — DX: Personal history of urinary calculi: Z87.442

## 2018-06-11 ENCOUNTER — Encounter: Payer: Self-pay | Admitting: Rheumatology

## 2018-06-11 ENCOUNTER — Ambulatory Visit (INDEPENDENT_AMBULATORY_CARE_PROVIDER_SITE_OTHER): Payer: Medicaid Other | Admitting: Rheumatology

## 2018-06-11 VITALS — BP 115/73 | HR 74 | Temp 97.1°F | Resp 13 | Ht 63.0 in | Wt 154.0 lb

## 2018-06-11 DIAGNOSIS — F419 Anxiety disorder, unspecified: Secondary | ICD-10-CM

## 2018-06-11 DIAGNOSIS — M7061 Trochanteric bursitis, right hip: Secondary | ICD-10-CM | POA: Diagnosis not present

## 2018-06-11 DIAGNOSIS — Z8673 Personal history of transient ischemic attack (TIA), and cerebral infarction without residual deficits: Secondary | ICD-10-CM | POA: Diagnosis not present

## 2018-06-11 DIAGNOSIS — Z8709 Personal history of other diseases of the respiratory system: Secondary | ICD-10-CM | POA: Diagnosis not present

## 2018-06-11 DIAGNOSIS — R768 Other specified abnormal immunological findings in serum: Secondary | ICD-10-CM

## 2018-06-11 DIAGNOSIS — M7062 Trochanteric bursitis, left hip: Secondary | ICD-10-CM

## 2018-06-11 DIAGNOSIS — M7918 Myalgia, other site: Secondary | ICD-10-CM

## 2018-06-11 DIAGNOSIS — Z8719 Personal history of other diseases of the digestive system: Secondary | ICD-10-CM

## 2018-06-11 DIAGNOSIS — F32A Depression, unspecified: Secondary | ICD-10-CM

## 2018-06-11 DIAGNOSIS — F329 Major depressive disorder, single episode, unspecified: Secondary | ICD-10-CM

## 2018-06-11 DIAGNOSIS — Z8669 Personal history of other diseases of the nervous system and sense organs: Secondary | ICD-10-CM

## 2018-06-11 DIAGNOSIS — Z87442 Personal history of urinary calculi: Secondary | ICD-10-CM

## 2018-06-12 DIAGNOSIS — R110 Nausea: Secondary | ICD-10-CM | POA: Diagnosis not present

## 2018-06-12 DIAGNOSIS — K449 Diaphragmatic hernia without obstruction or gangrene: Secondary | ICD-10-CM | POA: Diagnosis not present

## 2018-06-12 DIAGNOSIS — R05 Cough: Secondary | ICD-10-CM | POA: Diagnosis not present

## 2018-06-13 ENCOUNTER — Encounter (HOSPITAL_COMMUNITY): Payer: Self-pay | Admitting: Emergency Medicine

## 2018-06-13 ENCOUNTER — Emergency Department (HOSPITAL_COMMUNITY): Payer: Medicaid Other

## 2018-06-13 ENCOUNTER — Emergency Department (HOSPITAL_COMMUNITY)
Admission: EM | Admit: 2018-06-13 | Discharge: 2018-06-13 | Disposition: A | Discharge status: Home / Self Care | Payer: Medicaid Other | Attending: Emergency Medicine | Admitting: Emergency Medicine

## 2018-06-13 DIAGNOSIS — R079 Chest pain, unspecified: Secondary | ICD-10-CM | POA: Diagnosis not present

## 2018-06-13 DIAGNOSIS — J069 Acute upper respiratory infection, unspecified: Secondary | ICD-10-CM

## 2018-06-13 DIAGNOSIS — J45909 Unspecified asthma, uncomplicated: Secondary | ICD-10-CM | POA: Diagnosis not present

## 2018-06-13 DIAGNOSIS — Z8673 Personal history of transient ischemic attack (TIA), and cerebral infarction without residual deficits: Secondary | ICD-10-CM | POA: Diagnosis not present

## 2018-06-13 DIAGNOSIS — R05 Cough: Secondary | ICD-10-CM | POA: Diagnosis not present

## 2018-06-13 LAB — URINALYSIS, ROUTINE W REFLEX MICROSCOPIC
BILIRUBIN URINE: NEGATIVE
Glucose, UA: NEGATIVE mg/dL
KETONES UR: 5 mg/dL — AB
LEUKOCYTES UA: NEGATIVE
NITRITE: NEGATIVE
Protein, ur: NEGATIVE mg/dL
Specific Gravity, Urine: 1.009 (ref 1.005–1.030)
pH: 6 (ref 5.0–8.0)

## 2018-06-13 LAB — COMPREHENSIVE METABOLIC PANEL
ALT: 15 U/L (ref 0–44)
AST: 22 U/L (ref 15–41)
Albumin: 4.3 g/dL (ref 3.5–5.0)
Alkaline Phosphatase: 80 U/L (ref 38–126)
Anion gap: 9 (ref 5–15)
BUN: 10 mg/dL (ref 6–20)
CO2: 25 mmol/L (ref 22–32)
Calcium: 8.6 mg/dL — ABNORMAL LOW (ref 8.9–10.3)
Chloride: 103 mmol/L (ref 98–111)
Creatinine, Ser: 0.71 mg/dL (ref 0.44–1.00)
GFR calc Af Amer: >60 mL/min (ref >60)
GFR calc non Af Amer: >60 mL/min (ref >60)
Glucose, Bld: 80 mg/dL (ref 70–99)
Potassium: 3.4 mmol/L — ABNORMAL LOW (ref 3.5–5.1)
Sodium: 137 mmol/L (ref 135–145)
Total Bilirubin: 0.5 mg/dL (ref 0.3–1.2)
Total Protein: 7.9 g/dL (ref 6.5–8.1)

## 2018-06-13 LAB — CBC
HCT: 47.8 % — ABNORMAL HIGH (ref 36.0–46.0)
HEMOGLOBIN: 15.4 g/dL — AB (ref 12.0–15.0)
MCH: 31.7 pg (ref 26.0–34.0)
MCHC: 32.2 g/dL (ref 30.0–36.0)
MCV: 98.4 fL (ref 80.0–100.0)
Platelets: 207 10*3/uL (ref 150–400)
RBC: 4.86 MIL/uL (ref 3.87–5.11)
RDW: 12.4 % (ref 11.5–15.5)
WBC: 3.8 10*3/uL — ABNORMAL LOW (ref 4.0–10.5)
nRBC: 0 % (ref 0.0–0.2)

## 2018-06-13 LAB — I-STAT BETA HCG BLOOD, ED (MC, WL, AP ONLY): I-stat hCG, quantitative: <5 m[IU]/mL (ref <5)

## 2018-06-13 LAB — LIPASE, BLOOD: Lipase: 34 U/L (ref 11–51)

## 2018-06-13 MED ORDER — SODIUM CHLORIDE 0.9% FLUSH: 3.0000 mL | Freq: Once | INTRAVENOUS | Status: DC

## 2018-06-13 NOTE — Discharge Instructions (Signed)
Please return for any problem. Follow up with your regular care provider as instructed.   There was no evidence of a diaphragmatic hernia found on your workup today.

## 2018-06-13 NOTE — ED Triage Notes (Signed)
Per pt, states she went to [facility] for a cough, nausea and abdominal pain-states has Xray done, show an abdominal hernia

## 2018-06-13 NOTE — ED Provider Notes (Signed)
[facility]-EMERGENCY DEPT Provider Note   CSN: [id number] Arrival date & time: 06/13/18  0905     History   Chief Complaint Chief Complaint  Patient presents with  . Abdominal Pain  . Cough    HPI Danielle Holden is a 48 y.o. female.  48 year old female with prior medical history as detailed below presents for evaluation of possible diaphragmatic hernia.  Patient reports that she went to an urgent care ([facility]) yesterday for treatment of mild URI symptoms.  During her work-up at the urgent care she was given a chest x-ray.  She reportedly was then diagnosed with a diaphragmatic hernia and was told to come to the ED for emergent evaluation.  Patient is currently with mild complaints only of cough and congestion.  She denies fever.  She denies nausea or vomiting.  She denies abdominal pain.  Patient denies prior history of known diaphragmatic hernia.    The history is provided by the patient and medical records.  Cough  Cough characteristics:  Dry Sputum characteristics:  Nondescript Severity:  Mild Onset quality:  Gradual Duration:  2 days Timing:  Constant Progression:  Waxing and waning Chronicity:  New Worsened by:  Nothing Ineffective treatments:  None tried Associated symptoms: no chest pain, no fever and no shortness of breath     Past Medical History:  Diagnosis Date  . Abnormal uterine bleeding   . Anxiety   . Arthritis    hands, knees, shoulder, back   . Asthma   . Colitis    History of  . Complication of anesthesia 01/1992   During tubal ligation heart stopped and she stopped breathing  . Depression   . Environmental and seasonal allergies   . Fatigue   . Gallstones   . GERD (gastroesophageal reflux disease)   . Heart rate slow    per watch  . History of kidney stones   . History of shingles   . Hypoglycemia   . IBS (irritable bowel syndrome)   . Iron deficiency anemia    receive iron infusion  . Lactose intolerance   .  Lupus ([facility])   . Migraine   . Numbness and tingling of both upper extremities    first thing in the morning when she wakes up  . Numbness and tingling of lower extremity    first thing in the morning when she wakes up  . RA (rheumatoid arthritis) ([facility])   . Ventral hernia   . Vertigo     Patient Active Problem List   Diagnosis Date Noted  . History of kidney stones 05/29/2018  . History of asthma 05/29/2018  . Myofascial pain 05/29/2018  . History of IBS 05/29/2018  . History of gastroesophageal reflux (GERD) 05/29/2018  . History of TIA (transient ischemic attack) 05/29/2018  . Primary dysmenorrhea 06/13/2017  . Generalized anxiety disorder 07/26/2014  . Migraine without aura and without status migrainosus, not intractable 07/26/2014  . Hx of migraines 01/27/2014  . Migraine, unspecified, without mention of intractable migraine without mention of status migrainosus 09/28/2013  . Mastodynia, female 07/13/2013  . Abnormal uterine bleeding (AUB) 07/13/2013  . Iron deficiency anemia 06/30/2013  . TIA (transient ischemic attack) 06/27/2013  . Acute right-sided weakness 06/27/2013  . Anemia 06/27/2013  . Menorrhagia 06/27/2013  . Chest pain 06/27/2013    Past Surgical History:  Procedure Laterality Date  . APPENDECTOMY    . CESAREAN SECTION    . CHOLECYSTECTOMY [name] 08/07/2017   Procedure: LAPAROSCOPIC  CHOLECYSTECTOMY, PRIMARY REPAIR OF UMBILICAL HERNIA;  Surgeon: Andria MeuseWhite, Christopher M, MD;  Location: [facility];  Service: General;  Laterality: [name];  . COLONOSCOPY    . IUD REMOVAL [name] 11/12/2014  . TUBAL LIGATION    . UMBILICAL HERNIA REPAIR       OB History    Gravida  4   Para  3   Term  3   Preterm      AB  1   Living  3     SAB  1   TAB      Ectopic      Multiple      Live Births  3            Home Medications    Prior to Admission medications   Medication Sig Start Date End Date Taking? Authorizing Provider  albuterol (PROVENTIL HFA;VENTOLIN HFA)  108 (90 Base) MCG/ACT inhaler Inhale into the lungs as needed for wheezing or shortness of breath.    [provider]  ibuprofen (ADVIL,MOTRIN) 200 MG tablet Take 400 mg by mouth as needed.    [provider]  naproxen (NAPROSYN) 500 MG tablet Take 500 mg by mouth 2 (two) times daily as needed (pain, migraines).     [provider]    Family History Family History  Problem Relation Age of Onset  . Suicidality Father   . Diabetes Maternal Grandmother   . Breast cancer Maternal Grandmother   . Cervical cancer Paternal Grandmother   . Heart defect Son   . Healthy Son   . Healthy Daughter     Social History Social History   Tobacco Use  . Smoking status: Never Smoker  . Smokeless tobacco: Never Used  Substance Use Topics  . Alcohol use: No    Alcohol/week: 0.0 standard drinks  . Drug use: No    Comment: Quit in 1991     Allergies   Anesthetics, amide; Benzocaine; Black pepper [piper]; Mushroom extract complex; Trazodone and nefazodone; Amoxicillin; Bee venom; Benadryl [diphenhydramine]; Effexor [venlafaxine]; Lactose intolerance (gi); Nortriptyline; Nsaids; and Other   Review of Systems Review of Systems  Constitutional: Negative for fever.  Respiratory: Positive for cough. Negative for shortness of breath.   Cardiovascular: Negative for chest pain.  All other systems reviewed and are negative.    Physical Exam Updated Vital Signs BP 125/78 (BP Location: Left Arm)   Pulse 68   Temp 97.6 F (36.4 C) (Oral)   Resp 17   LMP 06/07/2018   SpO2 100%   Physical Exam Vitals signs and nursing note reviewed.  Constitutional:      General: She is not in acute distress.    Appearance: She is well-developed.  HENT:     Head: Normocephalic and atraumatic.  Eyes:     Conjunctiva/sclera: Conjunctivae normal.     Pupils: Pupils are equal, round, and reactive to light.  Neck:     Musculoskeletal: Normal range of motion and neck supple.    Cardiovascular:     Rate and Rhythm: Normal rate and regular rhythm.     Heart sounds: Normal heart sounds.  Pulmonary:     Effort: Pulmonary effort is normal. No respiratory distress.     Breath sounds: Normal breath sounds.  Abdominal:     General: Abdomen is flat. Bowel sounds are normal. There is no distension.     Palpations: Abdomen is soft.     Tenderness: There is no abdominal tenderness.  Musculoskeletal: Normal range of  motion.        General: No deformity.  Skin:    General: Skin is warm and dry.  Neurological:     Mental Status: She is alert and oriented to person, place, and time.      ED Treatments / Results  Labs (all labs ordered are listed, but only abnormal results are displayed) Labs Reviewed  COMPREHENSIVE METABOLIC PANEL - Abnormal; Notable for the following components:      Result Value   Potassium 3.4 (*)    Calcium 8.6 (*)    All other components within normal limits  CBC - Abnormal; Notable for the following components:   WBC 3.8 (*)    Hemoglobin 15.4 (*)    HCT 47.8 (*)    All other components within normal limits  URINALYSIS, ROUTINE W REFLEX MICROSCOPIC - Abnormal; Notable for the following components:   Hgb urine dipstick LARGE (*)    Ketones, ur 5 (*)    Bacteria, UA RARE (*)    All other components within normal limits  LIPASE, BLOOD  I-STAT BETA HCG BLOOD, ED ([facility], [facility], [facility] ONLY)    EKG None  Radiology Dg Chest 2 View  Result Date: 06/13/2018 CLINICAL DATA:  Cough, chest pain. EXAM: CHEST - 2 VIEW COMPARISON:  Radiographs of August 19, 2013. FINDINGS: The heart size and mediastinal contours are within normal limits. Both lungs are clear. No pneumothorax or pleural effusion is noted. The visualized skeletal structures are unremarkable. IMPRESSION: No active cardiopulmonary disease. Electronically Signed   By: Lupita RaiderJames  Green Jr, M.D.   On: 06/13/2018 10:21    Procedures Procedures (including critical care time)  Medications Ordered in  ED Medications  sodium chloride flush (NS) 0.9 % injection 3 mL (0 mLs Intravenous Hold 06/13/18 0940)     Initial Impression / Assessment and Plan / ED Course  I have reviewed the triage vital signs and the nursing notes.  Pertinent labs & imaging results that were available during my care of the patient were reviewed by me and considered in my medical decision making (see chart for details).     MDM  Screen complete   Patient is presenting for evaluation of possible diaphragmatic hernia.  Patient was at urgent care yesterday for evaluation of URI symptoms.  X-ray obtained there suggested possible diaphragmatic hernia.  Patient was advised to come to the ED for emergent evaluation.  Patient is without symptoms suggestive of diaphragmatic hernia.  Screening evaluation in the ED does not reveal significant acute pathology.  Patient appears to be without evidence of diaphragmatic hernia on work-up today.  Importance of close follow-up is stressed.  Strict return precautions given and understood.   Final Clinical Impressions(s) / ED Diagnoses   Final diagnoses:  Upper respiratory tract infection, unspecified type    ED Discharge Orders    None       Wynetta FinesMessick, Zymarion Favorite C, MD 06/13/18 1051

## 2018-06-23 ENCOUNTER — Inpatient Hospital Stay: Payer: Medicaid Other | Attending: Hematology

## 2018-06-23 DIAGNOSIS — Z9049 Acquired absence of other specified parts of digestive tract: Secondary | ICD-10-CM | POA: Diagnosis not present

## 2018-06-23 DIAGNOSIS — K589 Irritable bowel syndrome without diarrhea: Secondary | ICD-10-CM | POA: Diagnosis not present

## 2018-06-23 DIAGNOSIS — N920 Excessive and frequent menstruation with regular cycle: Secondary | ICD-10-CM | POA: Diagnosis not present

## 2018-06-23 DIAGNOSIS — D500 Iron deficiency anemia secondary to blood loss (chronic): Secondary | ICD-10-CM | POA: Diagnosis not present

## 2018-06-23 DIAGNOSIS — K439 Ventral hernia without obstruction or gangrene: Secondary | ICD-10-CM | POA: Diagnosis not present

## 2018-06-23 LAB — RETICULOCYTES
Immature Retic Fract: 8.2 % (ref 2.3–15.9)
RBC.: 4.73 MIL/uL (ref 3.87–5.11)
Retic Count, Absolute: 63.9 10*3/uL (ref 19.0–186.0)
Retic Ct Pct: 1.4 % (ref 0.4–3.1)

## 2018-06-23 LAB — CBC WITH DIFFERENTIAL (CANCER CENTER ONLY)
Abs Immature Granulocytes: 0.01 10*3/uL (ref 0.00–0.07)
Basophils Absolute: 0.1 10*3/uL (ref 0.0–0.1)
Basophils Relative: 1 %
Eosinophils Absolute: 0.4 10*3/uL (ref 0.0–0.5)
Eosinophils Relative: 4 %
HCT: 45.5 % (ref 36.0–46.0)
Hemoglobin: 14.8 g/dL (ref 12.0–15.0)
Immature Granulocytes: 0 %
Lymphocytes Relative: 19 %
Lymphs Abs: 1.7 10*3/uL (ref 0.7–4.0)
MCH: 31.3 pg (ref 26.0–34.0)
MCHC: 32.5 g/dL (ref 30.0–36.0)
MCV: 96.2 fL (ref 80.0–100.0)
MONO ABS: 0.7 10*3/uL (ref 0.1–1.0)
MONOS PCT: 8 %
Neutro Abs: 6 10*3/uL (ref 1.7–7.7)
Neutrophils Relative %: 68 %
Platelet Count: 302 10*3/uL (ref 150–400)
RBC: 4.73 MIL/uL (ref 3.87–5.11)
RDW: 12.1 % (ref 11.5–15.5)
WBC Count: 8.7 10*3/uL (ref 4.0–10.5)
nRBC: 0 % (ref 0.0–0.2)

## 2018-06-23 LAB — FERRITIN: Ferritin: 87 ng/mL (ref 11–307)

## 2018-06-23 LAB — IRON AND TIBC
Iron: 101 ug/dL (ref 41–142)
SATURATION RATIOS: 27 % (ref 21–57)
TIBC: 373 ug/dL (ref 236–444)
UIBC: 271 ug/dL (ref 120–384)

## 2018-06-25 DIAGNOSIS — R1084 Generalized abdominal pain: Secondary | ICD-10-CM | POA: Diagnosis not present

## 2018-06-25 DIAGNOSIS — Z8719 Personal history of other diseases of the digestive system: Secondary | ICD-10-CM | POA: Diagnosis not present

## 2018-07-21 ENCOUNTER — Ambulatory Visit: Payer: Medicaid Other | Admitting: Nurse Practitioner

## 2018-07-21 ENCOUNTER — Encounter: Payer: Self-pay | Admitting: Nurse Practitioner

## 2018-07-21 VITALS — BP 90/60 | HR 68 | Ht 63.0 in | Wt 151.0 lb

## 2018-07-21 DIAGNOSIS — R194 Change in bowel habit: Secondary | ICD-10-CM

## 2018-07-21 DIAGNOSIS — K625 Hemorrhage of anus and rectum: Secondary | ICD-10-CM

## 2018-07-21 DIAGNOSIS — R1030 Lower abdominal pain, unspecified: Secondary | ICD-10-CM | POA: Diagnosis not present

## 2018-07-21 MED ORDER — DICYCLOMINE HCL 10 MG PO CAPS: 10.0000 mg | ORAL_CAPSULE | Freq: Two times a day (BID) | ORAL | 5 refills | Status: DC

## 2018-07-21 NOTE — Progress Notes (Signed)
Reviewed. She is likely going to need repeat colonoscopy with biopsies to clarify her bowel issues and provide best treatment options

## 2018-07-21 NOTE — Patient Instructions (Signed)
If you are age 50 or older, your body mass index should be between 23-30. Your Body mass index is 26.75 kg/m. If this is out of the aforementioned range listed, please consider follow up with your Primary Care Provider.  If you are age 49 or younger, your body mass index should be between 19-25. Your Body mass index is 26.75 kg/m. If this is out of the aformentioned range listed, please consider follow up with your Primary Care Provider.   We have sent the following medications to your pharmacy for you to pick up at your convenience: Bentyl 10 mg  Requesting slides from [facility].  Follow up with Dr. Marina Goodell on 08/19/18 at 10:45 am.  Thank you for choosing me and [facility].   Willette Cluster, NP

## 2018-07-21 NOTE — Progress Notes (Signed)
Chief Complaint:   Colitis flare   IMPRESSION and PLAN:    48 year old female with intermittent loose, mucoid stools with blood.  Patient has a history of "colitis ".diagx by colonoscopy with biopsies in [place].  At time of last visit here in May 2019 we requested the pathology slides be sent to [place] for review by our local radiologist but apparently they have not been received.  -Will re-request pathology slides from [place] and bring patient back in for follow-up sometime in the next 4 to 6 weeks. Patient understands that in order to understand / treat her symptoms we need to either repeat a colonoscopy or obtain pathology slides from the one she had done in July 2018.  Right now we are going to pursue the latter.  Patient says she is allergic to anesthesia -In the interim we will try Bentyl 10 mg twice daily for lower abdominal pain -Recent CBC and CMET normal. CTAP with contrast early Dec 2018 without evidence for colitis.   -Multiple medication allergies.  Patient says her last colonoscopy was done with her partially awake because she is "allergic to anesthesia".    HPI:     Patient is a 48 year old female known to Dr. Marina Goodell.  She has a reported history of lupus and "colitis" found on colonoscopy with biopsies in [place] July 2018. Right colon and rectal biopsies suggested mildly active colitis. 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   Patient was evaluated by Dr. Marina Goodell late May 2019 for evaluation of ongoing diarrhea and minor rectal bleeding.  Plan was to minimize/stop NSAIDs, try Colestid for suspected bile salt diarrhea and to get outside pathology slides for review by our local pathologist.  I could not find any indication that those slides were received  Patient has returned for evaluation of ongoing lower abdominal pain, intermittent mucoid diarrhea with blood.  She did not notice any improvement with the Colestid and they caused nausea so  she stopped taking the Colestid after a week . Her "colitis" symptoms occur about every 3 months.  During flare she may have 4-5 bowel movements a day of mixed consistency but almost always with blood and mucus.  She has some nocturnal stooling. In between these "flares"  she feels relatively well except for ongoing intermittent diarrhea.  She complains of severe lower abdominal discomfort with these colitis flares as she calls them.  Naprosyn and ibuprofen on home med list but patient says she seldom takes these.  Sometimes Tylenol is not enough for headache so she will add in an NSAID  Patient was recently evaluated for URI symptoms by urgent care on [facility]. CXR done and suggested diaphragmatic hernia. She was sent to ED for emergent evaluation.  In the ED, a two-view CXR showed no acute findings or evidence for diaphragmatic hernia.  Of note,  CT scan of the abdomen and pelvis with contrast early December 2019 remarkable only for fat-containing umbilical hernia.  No evidence for colitis   Results reviewed: Labs 06/23/2018 CBC, CMET normal  Review of systems:     No chest pain, no SOB, no fevers, no urinary sx   Past Medical History:  Diagnosis Date  . Abnormal uterine bleeding   . Anxiety   . Arthritis    hands, knees, shoulder, back   . Asthma   . Colitis    History of  . Complication of anesthesia 01/1992   During tubal ligation heart  stopped and she stopped breathing  . Depression   . Environmental and seasonal allergies   . Fatigue   . Gallstones   . GERD (gastroesophageal reflux disease)   . Heart rate slow    per watch  . History of kidney stones   . History of shingles   . Hypoglycemia   . IBS (irritable bowel syndrome)   . Iron deficiency anemia    receive iron infusion  . Lactose intolerance   . Lupus (HCC)   . Migraine   . Numbness and tingling of both upper extremities    first thing in the morning when she wakes up  . Numbness and tingling of lower  extremity    first thing in the morning when she wakes up  . RA (rheumatoid arthritis) (HCC)   . Ventral hernia   . Vertigo     Patient's surgical history, family medical history, social history, medications and allergies were all reviewed in Epic   Creatinine clearance cannot be calculated (Patient's most recent lab result is older than the maximum 21 days allowed.)  Current Outpatient Medications  Medication Sig Dispense Refill  . albuterol (PROVENTIL HFA;VENTOLIN HFA) 108 (90 Base) MCG/ACT inhaler Inhale 1 puff into the lungs as needed for wheezing or shortness of breath.     Marland Kitchen ibuprofen (ADVIL,MOTRIN) 200 MG tablet Take 400 mg by mouth as needed for fever, headache or mild pain.     . naproxen (NAPROSYN) 500 MG tablet Take 500 mg by mouth 2 (two) times daily as needed (pain, migraines).      No current facility-administered medications for this visit.     Physical Exam:     BP 90/60 (BP Location: Left Arm, Patient Position: Sitting, Cuff Size: Normal)   Pulse 68   Ht 5\' 3"  (1.6 m)   Wt 151 lb (68.5 kg)   LMP 06/18/2018   BMI 26.75 kg/m   GENERAL:  Pleasant female in NAD PSYCH: : Cooperative, normal affect EENT:  conjunctiva pink, mucous membranes moist, neck supple without masses CARDIAC:  RRR, nomurmur heard, no peripheral edema PULM: Normal respiratory effort, lungs CTA bilaterally, no wheezing ABDOMEN:  Nondistended, soft, nontender. No obvious masses, no hepatomegaly,  normal bowel sounds SKIN:  turgor, no lesions seen Musculoskeletal:  Normal muscle tone, normal strength NEURO: Alert and oriented x 3, no focal neurologic deficits   Willette Cluster , NP 07/21/2018, 9:25 AM

## 2018-08-14 ENCOUNTER — Telehealth: Payer: Self-pay | Admitting: *Deleted

## 2018-08-14 NOTE — Telephone Encounter (Signed)
Called patient to offer web visit with Dr Marina Goodell on her scheduled 08/19/2018 visit date. Patient requests to reschedule to a later date as she does not wish to do this. Patient has been rescheduled to 10/02/18 and has been advised to call us back should she have worsening symptoms in the meantime.

## 2018-08-19 ENCOUNTER — Ambulatory Visit: Payer: Medicaid Other | Admitting: Internal Medicine

## 2018-09-24 ENCOUNTER — Telehealth: Payer: Self-pay

## 2018-09-24 NOTE — Telephone Encounter (Signed)
Called and LM for pt to call back to prescreen for today's appt. And to confirm webex?

## 2018-10-02 ENCOUNTER — Ambulatory Visit: Payer: Medicaid Other | Admitting: Internal Medicine

## 2018-10-17 ENCOUNTER — Telehealth: Payer: Self-pay | Admitting: Hematology

## 2018-10-17 NOTE — Telephone Encounter (Signed)
Left message for patient per 6/5 sch message - to call back for reschedule

## 2018-10-20 ENCOUNTER — Inpatient Hospital Stay: Payer: Medicaid Other

## 2018-10-20 ENCOUNTER — Inpatient Hospital Stay: Payer: Medicaid Other | Admitting: Hematology

## 2018-10-23 ENCOUNTER — Ambulatory Visit: Payer: Medicaid Other | Admitting: Internal Medicine

## 2018-11-13 ENCOUNTER — Ambulatory Visit: Payer: Medicaid Other | Admitting: Internal Medicine

## 2018-12-10 ENCOUNTER — Other Ambulatory Visit: Payer: Medicaid Other

## 2018-12-10 ENCOUNTER — Ambulatory Visit: Payer: Medicaid Other | Admitting: Hematology

## 2019-02-06 NOTE — Progress Notes (Signed)
[facility]   Telephone:([phone]) [phone] [phone]   Clinic Follow up Note   Patient Care Team: Patient, No Pcp Per as PCP - General (General Practice) Brock BadHarper, Charles A, MD as Consulting Physician (Obstetrics and Gynecology) Malachy MoodFeng, Coriann Brouhard, MD as Consulting Physician (Hematology) Pollyann SamplesBurton, Lacie K, NP as Nurse Practitioner (Nurse Practitioner)  Date of Service:  02/11/2019  CHIEF COMPLAINT: Iron deficiency anemia   CURRENT THERAPY:  IV Feraheme as needed, she received on 07/10/17, 07/17/17  INTERVAL HISTORY:  Danielle Holden is here for a follow up of anemia. She was last seen by me almost 1 year ago. She presents to the clinic alone. She notes she is doing well. She notes her energy is okay and adequate. She still always feels fatigue but still functional. She notes occasionally craving for ice. She notes her period stopped on its own 6 months ago and she had hot flashes.    REVIEW OF SYSTEMS:   Constitutional: Denies fevers, chills or abnormal weight loss (+) Fatigue (+) Mild hot flashes  Eyes: Denies blurriness of vision Ears, nose, mouth, throat, and face: Denies mucositis or sore throat Respiratory: Denies cough, dyspnea or wheezes Cardiovascular: Denies palpitation, chest discomfort or lower extremity swelling Gastrointestinal:  Denies nausea, heartburn or change in bowel habits Skin: Denies abnormal skin rashes Lymphatics: Denies new lymphadenopathy or easy bruising Neurological:Denies numbness, tingling or new weaknesses Behavioral/Psych: Mood is stable, no new changes  All other systems were reviewed with the patient and are negative.  MEDICAL HISTORY:  Past Medical History:  Diagnosis Date  . Abnormal uterine bleeding   . Anxiety   . Arthritis    hands, knees, shoulder, back   . Asthma   . Colitis    History of  . Complication of anesthesia 01/1992   During tubal ligation heart stopped and she stopped breathing  . Depression   . Environmental and  seasonal allergies   . Fatigue   . Gallstones   . GERD (gastroesophageal reflux disease)   . Heart rate slow    per watch  . History of kidney stones   . History of shingles   . Hypoglycemia   . IBS (irritable bowel syndrome)   . Iron deficiency anemia    receive iron infusion  . Lactose intolerance   . Lupus (HCC)   . Migraine   . Numbness and tingling of both upper extremities    first thing in the morning when she wakes up  . Numbness and tingling of lower extremity    first thing in the morning when she wakes up  . RA (rheumatoid arthritis) (HCC)   . Ventral hernia   . Vertigo     SURGICAL HISTORY: Past Surgical History:  Procedure Laterality Date  . APPENDECTOMY    . CESAREAN SECTION    . CHOLECYSTECTOMY [name] 08/07/2017   Procedure: LAPAROSCOPIC CHOLECYSTECTOMY, PRIMARY REPAIR OF UMBILICAL HERNIA;  Surgeon: Andria MeuseWhite, Christopher M, MD;  Location: [facility];  Service: General;  Laterality: [name];  . COLONOSCOPY    . IUD REMOVAL [name] 11/12/2014  . TUBAL LIGATION    . UMBILICAL HERNIA REPAIR      I have reviewed the social history and family history with the patient and they are unchanged from previous note.  ALLERGIES:  is allergic to anesthetics, amide; benzocaine; black pepper [piper]; mushroom extract complex; trazodone and nefazodone; amoxicillin; bee venom; benadryl [diphenhydramine]; effexor [venlafaxine]; lactose intolerance (gi); nortriptyline; nsaids; and other.  MEDICATIONS:  Current Outpatient Medications  Medication Sig Dispense Refill  . albuterol (PROVENTIL HFA;VENTOLIN HFA) 108 (90 Base) MCG/ACT inhaler Inhale 1 puff into the lungs as needed for wheezing or shortness of breath.     . dicyclomine (BENTYL) 10 MG capsule Take 1 capsule (10 mg total) by mouth 2 (two) times daily. Take twice daily as needed. 60 capsule 5  . ibuprofen (ADVIL,MOTRIN) 200 MG tablet Take 400 mg by mouth as needed for fever, headache or mild pain.     . naproxen (NAPROSYN) 500 MG tablet  Take 500 mg by mouth 2 (two) times daily as needed (pain, migraines).      No current facility-administered medications for this visit.     PHYSICAL EXAMINATION: ECOG PERFORMANCE STATUS: 1 - Symptomatic but completely ambulatory  Vitals:   02/11/19 0823  BP: 122/65  Pulse: 74  Resp: 18  Temp: 98 F (36.7 C)  SpO2: 94%   Filed Weights   02/11/19 0823  Weight: 157 lb 6.4 oz (71.4 kg)    GENERAL:alert, no distress and comfortable SKIN: skin color, texture, turgor are normal, no rashes or significant lesions EYES: normal, Conjunctiva are pink and non-injected, sclera clear  NECK: supple, thyroid normal size, non-tender, without nodularity LYMPH:  no palpable lymphadenopathy in the cervical, axillary  LUNGS: clear to auscultation and percussion with normal breathing effort HEART: regular rate & rhythm and no murmurs and no lower extremity edema ABDOMEN:abdomen soft, non-tender and normal bowel sounds Musculoskeletal:no cyanosis of digits and no clubbing  NEURO: alert & oriented x 3 with fluent speech, no focal motor/sensory deficits  LABORATORY DATA:  I have reviewed the data as listed CBC Latest Ref Rng & Units 02/11/2019 06/23/2018 06/13/2018  WBC 4.0 - 10.5 K/uL 6.4 8.7 3.8(L)  Hemoglobin 12.0 - 15.0 g/dL 14.1 14.8 15.4(H)  Hematocrit 36.0 - 46.0 % 43.7 45.5 47.8(H)  Platelets 150 - 400 K/uL 241 302 207     CMP Latest Ref Rng & Units 06/13/2018 05/21/2018 05/21/2018  Glucose 70 - 99 mg/dL 80 - 70  BUN 6 - 20 mg/dL 10 - 10  Creatinine 0.44 - 1.00 mg/dL 0.71 - 0.70  Sodium 135 - 145 mmol/L 137 - 139  Potassium 3.5 - 5.1 mmol/L 3.4(L) - 3.6  Chloride 98 - 111 mmol/L 103 - 104  CO2 22 - 32 mmol/L 25 - 25  Calcium 8.9 - 10.3 mg/dL 8.6(L) - 8.6  Total Protein 6.5 - 8.1 g/dL 7.9 7.1 6.9  Total Bilirubin 0.3 - 1.2 mg/dL 0.5 - 0.5  Alkaline Phos 38 - 126 U/L 80 - -  AST 15 - 41 U/L 22 - 14  ALT 0 - 44 U/L 15 - 9      RADIOGRAPHIC STUDIES: I have personally reviewed the  radiological images as listed and agreed with the findings in the report. No results found.   ASSESSMENT & PLAN:  Danielle Holden is a 48 y.o. female with   1.Anemia, iron deficiency due to menorrhagia -We previously reviewed her medical record in detail.She has a long-standing history of a hypochromic, microcytic anemia, elevated RDW,and low iron studies, consistent with iron deficiency anemia. -Anemia is likely secondary to menorrhagia -She previously did not respond well to oral iron, and also did not tolerated well due to constipation.  She is currently not on oral iron. -S/p IV Feraheme on 07/10/17 and 07/17/17, she tolerated well and responded well. Hgb normalized. Her fatigue has improved some. She has not required another dose since then.  -Her period stopped  6 months ago (07/2018). She has started having hot flashes. She is likely perimenopausal. This should resolve her anemia and IDA -Labs reviewed today, CBC WNL and anemia resolved. Iron panel still pending. Consider iv feraheme if ferritin <30 or low iron level  -She can continue to eat iron rich foods since she cannot take oral iron supplement. She can take a multivitamin with iron.  -F/u with her as needed. She will f/u with her other physicians regularly.    2. Colitis, IBS -Patient underwent colonoscopy on 11/22/16 that was notable for segmental area of mildly congested, erythematous mucosa in the colon and rectum. Pathology positive for mildly active colitis in the right and left colon with features of chronicity. Otherwise unremarkable exam.  -I encouraged her to avoid NSAIDs due to increased bleeding risk. She agrees.   3. Lupus, RA  -She did not f/u with Dr. Sharmon Revere at [facility] as I previously instructed; She was given the number and advised to call and f/u with them   4. Cholecystitis, ventral hernia  -s/p cholecystectomy and hernia repair per Dr. Cliffton Asters on 08/07/17  5. Vertigo, diagnosed 2013 -She has  intermittent dizziness; she can use over the counter meclizine PRN  PLAN -She has not had anemia this year, menstrual period has stopped, which will likely resolve her iron deficiency.   -f/u as needed   No problem-specific Assessment & Plan notes found for this encounter.   No orders of the defined types were placed in this encounter.  All questions were answered. The patient knows to call the clinic with any problems, questions or concerns. No barriers to learning was detected. I spent 15 minutes counseling the patient face to face. The total time spent in the appointment was 20 minutes and more than 50% was on counseling and review of test results     Malachy Mood, MD 02/11/2019   I, Delphina Cahill, am acting as scribe for Malachy Mood, MD.   I have reviewed the above documentation for accuracy and completeness, and I agree with the above.

## 2019-02-10 ENCOUNTER — Other Ambulatory Visit: Payer: Self-pay | Admitting: Hematology

## 2019-02-10 DIAGNOSIS — D500 Iron deficiency anemia secondary to blood loss (chronic): Secondary | ICD-10-CM

## 2019-02-11 ENCOUNTER — Other Ambulatory Visit: Payer: Self-pay

## 2019-02-11 ENCOUNTER — Telehealth: Payer: Self-pay | Admitting: Hematology

## 2019-02-11 ENCOUNTER — Inpatient Hospital Stay (HOSPITAL_BASED_OUTPATIENT_CLINIC_OR_DEPARTMENT_OTHER): Payer: Medicaid Other | Admitting: Hematology

## 2019-02-11 ENCOUNTER — Inpatient Hospital Stay: Payer: Medicaid Other | Attending: Hematology

## 2019-02-11 ENCOUNTER — Encounter: Payer: Self-pay | Admitting: Hematology

## 2019-02-11 VITALS — BP 122/65 | HR 74 | Temp 98.0°F | Resp 18 | Ht 63.0 in | Wt 157.4 lb

## 2019-02-11 DIAGNOSIS — N920 Excessive and frequent menstruation with regular cycle: Secondary | ICD-10-CM | POA: Diagnosis not present

## 2019-02-11 DIAGNOSIS — R5383 Other fatigue: Secondary | ICD-10-CM | POA: Diagnosis not present

## 2019-02-11 DIAGNOSIS — M069 Rheumatoid arthritis, unspecified: Secondary | ICD-10-CM | POA: Insufficient documentation

## 2019-02-11 DIAGNOSIS — Z23 Encounter for immunization: Secondary | ICD-10-CM | POA: Diagnosis not present

## 2019-02-11 DIAGNOSIS — M329 Systemic lupus erythematosus, unspecified: Secondary | ICD-10-CM | POA: Insufficient documentation

## 2019-02-11 DIAGNOSIS — K589 Irritable bowel syndrome without diarrhea: Secondary | ICD-10-CM | POA: Insufficient documentation

## 2019-02-11 DIAGNOSIS — D500 Iron deficiency anemia secondary to blood loss (chronic): Secondary | ICD-10-CM | POA: Diagnosis not present

## 2019-02-11 DIAGNOSIS — D509 Iron deficiency anemia, unspecified: Secondary | ICD-10-CM | POA: Insufficient documentation

## 2019-02-11 DIAGNOSIS — N951 Menopausal and female climacteric states: Secondary | ICD-10-CM | POA: Diagnosis not present

## 2019-02-11 DIAGNOSIS — Z79899 Other long term (current) drug therapy: Secondary | ICD-10-CM | POA: Diagnosis not present

## 2019-02-11 LAB — CBC WITH DIFFERENTIAL (CANCER CENTER ONLY)
Abs Immature Granulocytes: 0.01 10*3/uL (ref 0.00–0.07)
Basophils Absolute: 0 10*3/uL (ref 0.0–0.1)
Basophils Relative: 1 %
Eosinophils Absolute: 0.3 10*3/uL (ref 0.0–0.5)
Eosinophils Relative: 5 %
HCT: 43.7 % (ref 36.0–46.0)
Hemoglobin: 14.1 g/dL (ref 12.0–15.0)
Immature Granulocytes: 0 %
Lymphocytes Relative: 29 %
Lymphs Abs: 1.9 10*3/uL (ref 0.7–4.0)
MCH: 31.5 pg (ref 26.0–34.0)
MCHC: 32.3 g/dL (ref 30.0–36.0)
MCV: 97.8 fL (ref 80.0–100.0)
Monocytes Absolute: 0.4 10*3/uL (ref 0.1–1.0)
Monocytes Relative: 7 %
Neutro Abs: 3.8 10*3/uL (ref 1.7–7.7)
Neutrophils Relative %: 58 %
Platelet Count: 241 10*3/uL (ref 150–400)
RBC: 4.47 MIL/uL (ref 3.87–5.11)
RDW: 12.5 % (ref 11.5–15.5)
WBC Count: 6.4 10*3/uL (ref 4.0–10.5)
nRBC: 0 % (ref 0.0–0.2)

## 2019-02-11 LAB — IRON AND TIBC
Iron: 136 ug/dL (ref 41–142)
Saturation Ratios: 36 % (ref 21–57)
TIBC: 371 ug/dL (ref 236–444)
UIBC: 236 ug/dL (ref 120–384)

## 2019-02-11 LAB — RETIC PANEL
Immature Retic Fract: 9.4 % (ref 2.3–15.9)
RBC.: 4.41 MIL/uL (ref 3.87–5.11)
Retic Count, Absolute: 71 10*3/uL (ref 19.0–186.0)
Retic Ct Pct: 1.6 % (ref 0.4–3.1)
Reticulocyte Hemoglobin: 35.9 pg (ref >27.9)

## 2019-02-11 LAB — FERRITIN: Ferritin: 71 ng/mL (ref 11–307)

## 2019-02-11 MED ORDER — INFLUENZA VAC SPLIT QUAD 0.5 ML IM SUSY
PREFILLED_SYRINGE | INTRAMUSCULAR | Status: AC
  Filled 2019-02-11: qty 0.5

## 2019-02-11 MED ORDER — INFLUENZA VAC SPLIT QUAD 0.5 ML IM SUSY
0.5000 mL | PREFILLED_SYRINGE | Freq: Once | INTRAMUSCULAR | Status: AC
  Administered 2019-02-11: 0.5 mL via INTRAMUSCULAR

## 2019-02-11 NOTE — Telephone Encounter (Signed)
Per 9/29 los F/u as needed °

## 2019-02-16 ENCOUNTER — Other Ambulatory Visit: Payer: Self-pay | Admitting: Obstetrics

## 2019-02-16 ENCOUNTER — Encounter: Payer: Self-pay | Admitting: Hematology

## 2019-02-16 DIAGNOSIS — Z1231 Encounter for screening mammogram for malignant neoplasm of breast: Secondary | ICD-10-CM

## 2019-02-17 ENCOUNTER — Ambulatory Visit
Admission: RE | Admit: 2019-02-17 | Discharge: 2019-02-17 | Disposition: A | Discharge status: Home / Self Care | Payer: Medicaid Other | Source: Ambulatory Visit | Attending: Obstetrics | Admitting: Obstetrics

## 2019-02-17 ENCOUNTER — Other Ambulatory Visit: Payer: Self-pay

## 2019-02-17 ENCOUNTER — Other Ambulatory Visit: Payer: Self-pay | Admitting: Obstetrics

## 2019-02-17 DIAGNOSIS — Z1231 Encounter for screening mammogram for malignant neoplasm of breast: Secondary | ICD-10-CM

## 2019-02-17 DIAGNOSIS — N644 Mastodynia: Secondary | ICD-10-CM

## 2019-02-20 ENCOUNTER — Ambulatory Visit: Payer: Medicaid Other | Admitting: Obstetrics

## 2019-02-20 ENCOUNTER — Other Ambulatory Visit: Payer: Self-pay

## 2019-02-20 ENCOUNTER — Other Ambulatory Visit (HOSPITAL_COMMUNITY)
Admission: RE | Admit: 2019-02-20 | Discharge: 2019-02-20 | Disposition: A | Discharge status: Home / Self Care | Payer: Medicaid Other | Source: Ambulatory Visit | Attending: Obstetrics | Admitting: Obstetrics

## 2019-02-20 ENCOUNTER — Encounter: Payer: Self-pay | Admitting: Obstetrics

## 2019-02-20 VITALS — BP 126/61 | HR 61 | Ht 63.0 in | Wt 154.0 lb

## 2019-02-20 DIAGNOSIS — D500 Iron deficiency anemia secondary to blood loss (chronic): Secondary | ICD-10-CM

## 2019-02-20 DIAGNOSIS — J4520 Mild intermittent asthma, uncomplicated: Secondary | ICD-10-CM

## 2019-02-20 DIAGNOSIS — N898 Other specified noninflammatory disorders of vagina: Secondary | ICD-10-CM | POA: Diagnosis not present

## 2019-02-20 DIAGNOSIS — N951 Menopausal and female climacteric states: Secondary | ICD-10-CM

## 2019-02-20 DIAGNOSIS — K580 Irritable bowel syndrome with diarrhea: Secondary | ICD-10-CM

## 2019-02-20 DIAGNOSIS — Z01419 Encounter for gynecological examination (general) (routine) without abnormal findings: Secondary | ICD-10-CM | POA: Insufficient documentation

## 2019-02-20 DIAGNOSIS — J301 Allergic rhinitis due to pollen: Secondary | ICD-10-CM

## 2019-02-20 DIAGNOSIS — M329 Systemic lupus erythematosus, unspecified: Secondary | ICD-10-CM

## 2019-02-20 DIAGNOSIS — R300 Dysuria: Secondary | ICD-10-CM | POA: Diagnosis not present

## 2019-02-20 DIAGNOSIS — R102 Pelvic and perineal pain unspecified side: Secondary | ICD-10-CM

## 2019-02-20 DIAGNOSIS — Z0000 Encounter for general adult medical examination without abnormal findings: Secondary | ICD-10-CM | POA: Diagnosis not present

## 2019-02-20 DIAGNOSIS — N644 Mastodynia: Secondary | ICD-10-CM

## 2019-02-20 DIAGNOSIS — N944 Primary dysmenorrhea: Secondary | ICD-10-CM

## 2019-02-20 LAB — POCT URINALYSIS DIPSTICK
Bilirubin, UA: NEGATIVE
Glucose, UA: NEGATIVE
Ketones, UA: NEGATIVE
Leukocytes, UA: NEGATIVE
Nitrite, UA: NEGATIVE
Protein, UA: NEGATIVE
Spec Grav, UA: <=1.005 — AB (ref 1.010–1.025)
Urobilinogen, UA: 0.2 E.U./dL
pH, UA: 6 (ref 5.0–8.0)

## 2019-02-20 MED ORDER — VITAFOL ULTRA 29-0.6-0.4-200 MG PO CAPS: 1.0000 | ORAL_CAPSULE | Freq: Every day | ORAL | 4 refills | Status: DC

## 2019-02-20 MED ORDER — ALBUTEROL SULFATE HFA 108 (90 BASE) MCG/ACT IN AERS: 1.0000 | INHALATION_SPRAY | RESPIRATORY_TRACT | 5 refills | Status: DC | PRN

## 2019-02-20 MED ORDER — LORATADINE 10 MG PO TABS: 10.0000 mg | ORAL_TABLET | Freq: Every day | ORAL | 11 refills | Status: DC

## 2019-02-20 MED ORDER — TRAMADOL HCL 50 MG PO TABS: 50.0000 mg | ORAL_TABLET | Freq: Four times a day (QID) | ORAL | 2 refills | Status: DC | PRN

## 2019-02-20 NOTE — Addendum Note (Signed)
Addended by: Courtney Heys on: 02/20/2019 11:10 AM   Modules accepted: Orders

## 2019-02-20 NOTE — Progress Notes (Signed)
Subjective:        Danielle Holden is a 48 y.o. female here for a routine exam.  Current complaints: Left breast pain.  No period since Feb. 2020.  Personal health questionnaire:  Is patient [name], have a family history of breast and/or ovarian cancer: yes Is there a family history of uterine cancer diagnosed at age < 4250, gastrointestinal cancer, urinary tract cancer, family member who is a Personnel officerLynch syndrome-associated carrier: no Is the patient overweight and hypertensive, family history of diabetes, personal history of gestational diabetes, preeclampsia or PCOS: no Is patient over 6155, have PCOS,  family history of premature CHD under age 50, diabetes, smoke, have hypertension or peripheral artery disease:  no At any time, has a partner hit, kicked or otherwise hurt or frightened you?: no Over the past 2 weeks, have you felt down, depressed or hopeless?: no Over the past 2 weeks, have you felt little interest or pleasure in doing things?:no   Gynecologic History Patient's last menstrual period was 07/06/2018 (approximate). Contraception: tubal ligation Last Pap: 05-30-2017. Results were: normal Last mammogram: 06-05-2017. Results were: normal  Obstetric History OB History  Gravida Para Term Preterm AB Living  4 3 3   1 3   SAB TAB Ectopic Multiple Live Births  1       3    # Outcome Date GA Lbr Len/2nd Weight Sex Delivery Anes PTL Lv  4 SAB 2009 445w0d         3 Term 1993 [id number]   M Vag-Spont   LIV  2 Term 08/1990 1051w0d   F Vag-Spont   LIV  1 Term [id number]    M CS-LTranv   LIV    Past Medical History:  Diagnosis Date  . Abnormal uterine bleeding   . Anxiety   . Arthritis    hands, knees, shoulder, back   . Asthma   . Colitis    History of  . Complication of anesthesia 01/1992   During tubal ligation heart stopped and she stopped breathing  . Depression   . Environmental and seasonal allergies   . Fatigue   . Gallstones   . GERD (gastroesophageal reflux  disease)   . Heart rate slow    per watch  . History of kidney stones   . History of shingles   . Hypoglycemia   . IBS (irritable bowel syndrome)   . Iron deficiency anemia    receive iron infusion  . Lactose intolerance   . Lupus (HCC)   . Migraine   . Numbness and tingling of both upper extremities    first thing in the morning when she wakes up  . Numbness and tingling of lower extremity    first thing in the morning when she wakes up  . RA (rheumatoid arthritis) (HCC)   . Ventral hernia   . Vertigo     Past Surgical History:  Procedure Laterality Date  . APPENDECTOMY    . CESAREAN SECTION    . CHOLECYSTECTOMY [name] 08/07/2017   Procedure: LAPAROSCOPIC CHOLECYSTECTOMY, PRIMARY REPAIR OF UMBILICAL HERNIA;  Surgeon: Andria MeuseWhite, Christopher M, MD;  Location: [facility];  Service: General;  Laterality: [name];  . COLONOSCOPY    . IUD REMOVAL [name] 11/12/2014  . TUBAL LIGATION    . UMBILICAL HERNIA REPAIR       Current Outpatient Medications:  .  albuterol (VENTOLIN HFA) 108 (90 Base) MCG/ACT inhaler, Inhale 1 puff into the lungs as needed for wheezing or shortness  of breath., Disp: 18 g, Rfl: 5 .  dicyclomine (BENTYL) 10 MG capsule, Take 1 capsule (10 mg total) by mouth 2 (two) times daily. Take twice daily as needed., Disp: 60 capsule, Rfl: 5 .  ibuprofen (ADVIL,MOTRIN) 200 MG tablet, Take 400 mg by mouth as needed for fever, headache or mild pain. , Disp: , Rfl:  .  naproxen (NAPROSYN) 500 MG tablet, Take 500 mg by mouth 2 (two) times daily as needed (pain, migraines). , Disp: , Rfl:  .  loratadine (CLARITIN) 10 MG tablet, Take 1 tablet (10 mg total) by mouth daily., Disp: 30 tablet, Rfl: 11 .  Prenat-Fe Poly-Methfol-FA-DHA (VITAFOL ULTRA) 29-0.6-0.4-200 MG CAPS, Take 1 capsule by mouth daily before breakfast., Disp: 90 capsule, Rfl: 4 .  traMADol (ULTRAM) 50 MG tablet, Take 1 tablet (50 mg total) by mouth every 6 (six) hours as needed for moderate pain., Disp: 30 tablet, Rfl: 2 Allergies   Allergen Reactions  . Anesthetics, Amide Anaphylaxis and Other (See Comments)    Heart stopped and stopped breathing  . Benzocaine Anaphylaxis  . Black Pepper [[place]] Anaphylaxis    Throat swelling  . Mushroom Extract Complex Anaphylaxis    Mouth swelling  . Trazodone And Nefazodone Anaphylaxis    Tongue swells.  . Amoxicillin Hives and Diarrhea    Has patient had a PCN reaction causing immediate rash, facial/tongue/throat swelling, SOB or lightheadedness with hypotension: No Has patient had a PCN reaction causing severe rash involving mucus membranes or skin necrosis: Yes Has patient had a PCN reaction that required hospitalization: Yes Has patient had a PCN reaction occurring within the last 10 years: Yes If all of the above answers are "NO", then may proceed with Cephalosporin use.   . Bee Venom Swelling  . Benadryl [Diphenhydramine]     Heavy periods   . Effexor [Venlafaxine] Hives  . Lactose Intolerance ([place])     Stomach pain  . Nortriptyline Hives  . Nsaids     Make colitis worse  . Other Diarrhea    Red meat - stomach pains, bleeding     Social History   Tobacco Use  . Smoking status: Never Smoker  . Smokeless tobacco: Never Used  Substance Use Topics  . Alcohol use: No    Alcohol/week: 0.0 standard drinks    Family History  Problem Relation Age of Onset  . Suicidality Father   . Diabetes Maternal Grandmother   . Breast cancer Maternal Grandmother   . Cervical cancer Paternal Grandmother   . Heart defect Son   . Healthy Son   . Healthy Daughter       Review of Systems  Constitutional: negative for fatigue and weight loss Respiratory: negative for cough and wheezing Cardiovascular: negative for chest pain, fatigue and palpitations Gastrointestinal: negative for abdominal pain and change in bowel habits Musculoskeletal:negative for myalgias Neurological: negative for gait problems and tremors Behavioral/Psych: negative for abusive relationship,  depression Endocrine: negative for temperature intolerance    Genitourinary:negative for abnormal menstrual periods, genital lesions, hot flashes, sexual problems and vaginal discharge Integument/breast: negative for breast lump, breast tenderness, nipple discharge and skin lesion(s)    Objective:       BP 126/61   Pulse 61   Ht 5\' 3"  (1.6 m)   Wt 154 lb (69.9 kg)   LMP 07/06/2018 (Approximate)   BMI 27.28 kg/m  General:   alert  Skin:   no rash or abnormalities  Lungs:   clear to auscultation bilaterally  Heart:  regular rate and rhythm, S1, S2 normal, no murmur, click, rub or gallop  Breasts:   normal without suspicious masses, skin or nipple changes or axillary nodes  Abdomen:  normal findings: no organomegaly, soft, non-tender and no hernia  Pelvis:  External genitalia: normal general appearance Urinary system: urethral meatus normal and bladder without fullness, nontender Vaginal: normal without tenderness, induration or masses Cervix: normal appearance Adnexa: normal bimanual exam Uterus: anteverted and non-tender, normal size   Lab Review Urine pregnancy test Labs reviewed yes Radiologic studies reviewed yes  50% of 25 min visit spent on counseling and coordination of care.   Assessment:     1. Encounter for routine gynecological examination with Papanicolaou smear of cervix Rx: - Cytology - PAP - CBC with Differential/Platelet; Future - Comprehensive metabolic panel; Future - TSH; Future - Cholesterol, total; Future - Triglycerides; Future - HDL cholesterol; Future - Direct LDL; Future - VITAMIN D 25 Hydroxy (Vit-D Deficiency, Fractures); Future - Prenat-Fe Poly-Methfol-FA-DHA (VITAFOL ULTRA) 29-0.6-0.4-200 MG CAPS; Take 1 capsule by mouth daily before breakfast.  Dispense: 90 capsule; Refill: 4  2. Perimenopause  3. Pelvic pain Rx: - US PELVIC COMPLETE WITH TRANSVAGINAL - traMADol (ULTRAM) 50 MG tablet; Take 1 tablet (50 mg total) by mouth every 6  (six) hours as needed for moderate pain.  Dispense: 30 tablet; Refill: 2  4. Primary dysmenorrhea  5. Dysuria Rx: - Urine Culture  6. Mastodynia of left breast - mammogram ordered  7. Systemic lupus erythematosus, unspecified SLE type, unspecified organ involvement status ([place]) - clinically stable  8. Irritable bowel syndrome with diarrhea - clinically stable  9. Iron deficiency anemia due to chronic blood loss - clinically stable  10. Vaginal discharge Rx: - Cervicovaginal ancillary only( [place])  11. Mild intermittent asthma without complication Rx: - albuterol (VENTOLIN HFA) 108 (90 Base) MCG/ACT inhaler; Inhale 1 puff into the lungs as needed for wheezing or shortness of breath.  Dispense: 18 g; Refill: 5  12. Seasonal allergic rhinitis due to pollen Rx: - loratadine (CLARITIN) 10 MG tablet; Take 1 tablet (10 mg total) by mouth daily.  Dispense: 30 tablet; Refill: 11    Plan:    Education reviewed: calcium supplements, depression evaluation, low fat, low cholesterol diet, safe sex/STD prevention, self breast exams and weight bearing exercise. Contraception: none. Mammogram ordered. Follow up in: 1 year.  Contraception correction: patient has had a tubal Ligation  Meds ordered this encounter  Medications  . Prenat-Fe Poly-Methfol-FA-DHA (VITAFOL ULTRA) 29-0.6-0.4-200 MG CAPS    Sig: Take 1 capsule by mouth daily before breakfast.    Dispense:  90 capsule    Refill:  4  . traMADol (ULTRAM) 50 MG tablet    Sig: Take 1 tablet (50 mg total) by mouth every 6 (six) hours as needed for moderate pain.    Dispense:  30 tablet    Refill:  2  . albuterol (VENTOLIN HFA) 108 (90 Base) MCG/ACT inhaler    Sig: Inhale 1 puff into the lungs as needed for wheezing or shortness of breath.    Dispense:  18 g    Refill:  5  . loratadine (CLARITIN) 10 MG tablet    Sig: Take 1 tablet (10 mg total) by mouth daily.    Dispense:  30 tablet    Refill:  11   Orders Placed This  Encounter  Procedures  . Urine Culture  . US PELVIC COMPLETE WITH TRANSVAGINAL    Order Specific Question:   Reason for  Exam (SYMPTOM  OR DIAGNOSIS REQUIRED)    Answer:   Pelvic pain    Order Specific Question:   Preferred imaging location?    Answer:   [facility] Out[facility] Ultrasound  . CBC with Differential/Platelet    Standing Status:   Future    Standing Expiration Date:   02/20/2020  . Comprehensive metabolic panel    Standing Status:   Future    Standing Expiration Date:   02/20/2020  . TSH    Standing Status:   Future    Standing Expiration Date:   02/20/2020  . Cholesterol, total    Standing Status:   Future    Standing Expiration Date:   02/20/2020  . Triglycerides    Standing Status:   Future    Standing Expiration Date:   02/20/2020  . HDL cholesterol    Standing Status:   Future    Standing Expiration Date:   02/20/2020  . Direct LDL    Standing Status:   Future    Standing Expiration Date:   02/20/2020  . VITAMIN D 25 Hydroxy (Vit-D Deficiency, Fractures)    Standing Status:   Future    Standing Expiration Date:   02/20/2020    Brock Bad, MD 02/20/2019 10:45 AM

## 2019-02-20 NOTE — Progress Notes (Signed)
Patient Presents For Annual Exam Today.  Last pap:05/30/2017 WNL  Mammogram: 06/05/2017 Scheduled 02/23/19 STD Screening: Pt has not been active  Contraception: BTL   CC: Patient notes pain from right ovary. Has not had cycle since FEB 2020. Pt has recently had spotting. Pain in Left Breast Possible UTI pt notes frequent urination and burning.

## 2019-02-20 NOTE — Addendum Note (Signed)
Addended by: Mliss Fritz on: 02/20/2019 11:19 AM   Modules accepted: Orders

## 2019-02-20 NOTE — Patient Instructions (Signed)
Menopause Menopause is the normal time of life when menstrual periods stop completely. It is usually confirmed by 12 months without a menstrual period. The transition to menopause (perimenopause) most often happens between the ages of 45 and 55. During perimenopause, hormone levels change in your body, which can cause symptoms and affect your health. Menopause may increase your risk for:  Loss of bone (osteoporosis), which causes bone breaks (fractures).  Depression.  Hardening and narrowing of the arteries (atherosclerosis), which can cause heart attacks and strokes. What are the causes? This condition is usually caused by a natural change in hormone levels that happens as you get older. The condition may also be caused by surgery to remove both ovaries (bilateral oophorectomy). What increases the risk? This condition is more likely to start at an earlier age if you have certain medical conditions or treatments, including:  A tumor of the pituitary gland in the brain.  A disease that affects the ovaries and hormone production.  Radiation treatment for cancer.  Certain cancer treatments, such as chemotherapy or hormone (anti-estrogen) therapy.  Heavy smoking and excessive alcohol use.  Family history of early menopause. This condition is also more likely to develop earlier in women who are very thin. What are the signs or symptoms? Symptoms of this condition include:  Hot flashes.  Irregular menstrual periods.  Night sweats.  Changes in feelings about sex. This could be a decrease in sex drive or an increased comfort around your sexuality.  Vaginal dryness and thinning of the vaginal walls. This may cause painful intercourse.  Dryness of the skin and development of wrinkles.  Headaches.  Problems sleeping (insomnia).  Mood swings or irritability.  Memory problems.  Weight gain.  Hair growth on the face and chest.  Bladder infections or problems with urinating. How  is this diagnosed? This condition is diagnosed based on your medical history, a physical exam, your age, your menstrual history, and your symptoms. Hormone tests may also be done. How is this treated? In some cases, no treatment is needed. You and your health care provider should make a decision together about whether treatment is necessary. Treatment will be based on your individual condition and preferences. Treatment for this condition focuses on managing symptoms. Treatment may include:  Menopausal hormone therapy (MHT).  Medicines to treat specific symptoms or complications.  Acupuncture.  Vitamin or herbal supplements. Before starting treatment, make sure to let your health care provider know if you have a personal or family history of:  Heart disease.  Breast cancer.  Blood clots.  Diabetes.  Osteoporosis. Follow these instructions at home: Lifestyle  Do not use any products that contain nicotine or tobacco, such as cigarettes and e-cigarettes. If you need help quitting, ask your health care provider.  Get at least 30 minutes of physical activity on 5 or more days each week.  Avoid alcoholic and caffeinated beverages, as well as spicy foods. This may help prevent hot flashes.  Get 7-8 hours of sleep each night.  If you have hot flashes, try: ? Dressing in layers. ? Avoiding things that may trigger hot flashes, such as spicy food, warm places, or stress. ? Taking slow, deep breaths when a hot flash starts. ? Keeping a fan in your home and office.  Find ways to manage stress, such as deep breathing, meditation, or journaling.  Consider going to group therapy with other women who are having menopause symptoms. Ask your health care provider about recommended group therapy meetings. Eating and   drinking  Eat a healthy, balanced diet that contains whole grains, lean protein, low-fat dairy, and plenty of fruits and vegetables.  Your health care provider may recommend  adding more soy to your diet. Foods that contain soy include tofu, tempeh, and soy milk.  Eat plenty of foods that contain calcium and vitamin D for bone health. Items that are rich in calcium include low-fat milk, yogurt, beans, almonds, sardines, broccoli, and kale. Medicines  Take over-the-counter and prescription medicines only as told by your health care provider.  Talk with your health care provider before starting any herbal supplements. If prescribed, take vitamins and supplements as told by your health care provider. These may include: ? Calcium. Women age 51 and older should get 1,200 mg (milligrams) of calcium every day. ? Vitamin D. Women need 600-800 International Units of vitamin D each day. ? Vitamins B12 and B6. Aim for 50 micrograms of B12 and 1.5 mg of B6 each day. General instructions  Keep track of your menstrual periods, including: ? When they occur. ? How heavy they are and how long they last. ? How much time passes between periods.  Keep track of your symptoms, noting when they start, how often you have them, and how long they last.  Use vaginal lubricants or moisturizers to help with vaginal dryness and improve comfort during sex.  Keep all follow-up visits as told by your health care provider. This is important. This includes any group therapy or counseling. Contact a health care provider if:  You are still having menstrual periods after age 55.  You have pain during sex.  You have not had a period for 12 months and you develop vaginal bleeding. Get help right away if:  You have: ? Severe depression. ? Excessive vaginal bleeding. ? Pain when you urinate. ? A fast or irregular heart beat (palpitations). ? Severe headaches. ? Abdomen (abdominal) pain or severe indigestion.  You fell and you think you have a broken bone.  You develop leg or chest pain.  You develop vision problems.  You feel a lump in your breast. Summary  Menopause is the normal  time of life when menstrual periods stop completely. It is usually confirmed by 12 months without a menstrual period.  The transition to menopause (perimenopause) most often happens between the ages of 45 and 55.  Symptoms can be managed through medicines, lifestyle changes, and complementary therapies such as acupuncture.  Eat a balanced diet that is rich in nutrients to promote bone health and heart health and to manage symptoms during menopause. This information is not intended to replace advice given to you by your health care provider. Make sure you discuss any questions you have with your health care provider. Document Released: 07/21/2003 Document Revised: 04/12/2017 Document Reviewed: 06/02/2016 Elsevier Patient Education  2020 Else[employer] Perimenopause  Perimenopause is the normal time of life before and after menstrual periods stop completely (menopause). Perimenopause can begin 2-8 years before menopause, and it usually lasts for 1 year after menopause. During perimenopause, the ovaries may or may not produce an egg. What are the causes? This condition is caused by a natural change in hormone levels that happens as you get older. What increases the risk? This condition is more likely to start at an earlier age if you have certain medical conditions or treatments, including:  A tumor of the pituitary gland in the brain.  A disease that affects the ovaries and hormone production.  Radiation treatment for cancer.    Certain cancer treatments, such as chemotherapy or hormone (anti-estrogen) therapy.  Heavy smoking and excessive alcohol use.  Family history of early menopause. What are the signs or symptoms? Perimenopausal changes affect each woman differently. Symptoms of this condition may include:  Hot flashes.  Night sweats.  Irregular menstrual periods.  Decreased sex drive.  Vaginal dryness.  Headaches.  Mood swings.  Depression.  Memory problems or  trouble concentrating.  Irritability.  Tiredness.  Weight gain.  Anxiety.  Trouble getting pregnant. How is this diagnosed? This condition is diagnosed based on your medical history, a physical exam, your age, your menstrual history, and your symptoms. Hormone tests may also be done. How is this treated? In some cases, no treatment is needed. You and your health care provider should make a decision together about whether treatment is necessary. Treatment will be based on your individual condition and preferences. Various treatments are available, such as:  Menopausal hormone therapy (MHT).  Medicines to treat specific symptoms.  Acupuncture.  Vitamin or herbal supplements. Before starting treatment, make sure to let your health care provider know if you have a personal or family history of:  Heart disease.  Breast cancer.  Blood clots.  Diabetes.  Osteoporosis. Follow these instructions at home: Lifestyle  Do not use any products that contain nicotine or tobacco, such as cigarettes and e-cigarettes. If you need help quitting, ask your health care provider.  Eat a balanced diet that includes fresh fruits and vegetables, whole grains, soybeans, eggs, lean meat, and low-fat dairy.  Get at least 30 minutes of physical activity on 5 or more days each week.  Avoid alcoholic and caffeinated beverages, as well as spicy foods. This may help prevent hot flashes.  Get 7-8 hours of sleep each night.  Dress in layers that can be removed to help you manage hot flashes.  Find ways to manage stress, such as deep breathing, meditation, or journaling. General instructions  Keep track of your menstrual periods, including: ? When they occur. ? How heavy they are and how long they last. ? How much time passes between periods.  Keep track of your symptoms, noting when they start, how often you have them, and how long they last.  Take over-the-counter and prescription medicines  only as told by your health care provider.  Take vitamin supplements only as told by your health care provider. These may include calcium, vitamin E, and vitamin D.  Use vaginal lubricants or moisturizers to help with vaginal dryness and improve comfort during sex.  Talk with your health care provider before starting any herbal supplements.  Keep all follow-up visits as told by your health care provider. This is important. This includes any group therapy or counseling. Contact a health care provider if:  You have heavy vaginal bleeding or pass blood clots.  Your period lasts more than 2 days longer than normal.  Your periods are recurring sooner than 21 days.  You bleed after having sex. Get help right away if:  You have chest pain, trouble breathing, or trouble talking.  You have severe depression.  You have pain when you urinate.  You have severe headaches.  You have vision problems. Summary  Perimenopause is the time when a woman's body begins to move into menopause. This may happen naturally or as a result of other health problems or medical treatments.  Perimenopause can begin 2-8 years before menopause, and it usually lasts for 1 year after menopause.  Perimenopausal symptoms can be managed through   medicines, lifestyle changes, and complementary therapies such as acupuncture. This information is not intended to replace advice given to you by your health care provider. Make sure you discuss any questions you have with your health care provider. Document Released: 06/07/2004 Document Revised: 04/12/2017 Document Reviewed: 06/05/2016 Elsevier Patient Education  2020 Elsevier Inc.  

## 2019-02-21 LAB — CBC WITH DIFFERENTIAL/PLATELET
Basophils Absolute: 0 10*3/uL (ref 0.0–0.2)
Basos: 1 %
EOS (ABSOLUTE): 0.2 10*3/uL (ref 0.0–0.4)
Eos: 4 %
Hematocrit: 42.4 % (ref 34.0–46.6)
Hemoglobin: 14.1 g/dL (ref 11.1–15.9)
Immature Grans (Abs): 0 10*3/uL (ref 0.0–0.1)
Immature Granulocytes: 0 %
Lymphocytes Absolute: 1.8 10*3/uL (ref 0.7–3.1)
Lymphs: 28 %
MCH: 31.7 pg (ref 26.6–33.0)
MCHC: 33.3 g/dL (ref 31.5–35.7)
MCV: 95 fL (ref 79–97)
Monocytes Absolute: 0.4 10*3/uL (ref 0.1–0.9)
Monocytes: 7 %
Neutrophils Absolute: 3.9 10*3/uL (ref 1.4–7.0)
Neutrophils: 60 %
Platelets: 270 10*3/uL (ref 150–450)
RBC: 4.45 x10E6/uL (ref 3.77–5.28)
RDW: 12 % (ref 11.7–15.4)
WBC: 6.4 10*3/uL (ref 3.4–10.8)

## 2019-02-21 LAB — COMPREHENSIVE METABOLIC PANEL
ALT: 9 IU/L (ref 0–32)
AST: 12 IU/L (ref 0–40)
Albumin/Globulin Ratio: 1.8 (ref 1.2–2.2)
Albumin: 4.4 g/dL (ref 3.8–4.8)
Alkaline Phosphatase: 109 IU/L (ref 39–117)
BUN/Creatinine Ratio: 12 (ref 9–23)
BUN: 9 mg/dL (ref 6–24)
Bilirubin Total: 0.3 mg/dL (ref 0.0–1.2)
CO2: 22 mmol/L (ref 20–29)
Calcium: 9.3 mg/dL (ref 8.7–10.2)
Chloride: 103 mmol/L (ref 96–106)
Creatinine, Ser: 0.76 mg/dL (ref 0.57–1.00)
GFR calc Af Amer: 107 mL/min/{1.73_m2} (ref >59)
GFR calc non Af Amer: 92 mL/min/{1.73_m2} (ref >59)
Globulin, Total: 2.5 g/dL (ref 1.5–4.5)
Glucose: 75 mg/dL (ref 65–99)
Potassium: 3.9 mmol/L (ref 3.5–5.2)
Sodium: 141 mmol/L (ref 134–144)
Total Protein: 6.9 g/dL (ref 6.0–8.5)

## 2019-02-21 LAB — CHOLESTEROL, TOTAL: Cholesterol, Total: 180 mg/dL (ref 100–199)

## 2019-02-21 LAB — TSH: TSH: 1.35 u[IU]/mL (ref 0.450–4.500)

## 2019-02-21 LAB — VITAMIN D 25 HYDROXY (VIT D DEFICIENCY, FRACTURES): Vit D, 25-Hydroxy: 16.8 ng/mL — ABNORMAL LOW (ref 30.0–100.0)

## 2019-02-21 LAB — HDL CHOLESTEROL: HDL: 70 mg/dL (ref >39)

## 2019-02-21 LAB — LDL CHOLESTEROL, DIRECT: LDL Direct: 97 mg/dL (ref 0–99)

## 2019-02-21 LAB — TRIGLYCERIDES: Triglycerides: 69 mg/dL (ref 0–149)

## 2019-02-22 LAB — URINE CULTURE: Organism ID, Bacteria: NO GROWTH

## 2019-02-23 ENCOUNTER — Other Ambulatory Visit: Payer: Self-pay

## 2019-02-23 ENCOUNTER — Other Ambulatory Visit: Payer: Self-pay | Admitting: Obstetrics

## 2019-02-23 ENCOUNTER — Ambulatory Visit
Admission: RE | Admit: 2019-02-23 | Discharge: 2019-02-23 | Disposition: A | Discharge status: Home / Self Care | Payer: Medicaid Other | Source: Ambulatory Visit | Attending: Obstetrics | Admitting: Obstetrics

## 2019-02-23 DIAGNOSIS — R922 Inconclusive mammogram: Secondary | ICD-10-CM | POA: Diagnosis not present

## 2019-02-23 DIAGNOSIS — E559 Vitamin D deficiency, unspecified: Secondary | ICD-10-CM

## 2019-02-23 DIAGNOSIS — N644 Mastodynia: Secondary | ICD-10-CM

## 2019-02-23 MED ORDER — VITAMIN D (ERGOCALCIFEROL) 1.25 MG (50000 UNIT) PO CAPS: 50000.0000 [IU] | ORAL_CAPSULE | ORAL | 0 refills | Status: DC

## 2019-03-02 LAB — CERVICOVAGINAL ANCILLARY ONLY
Bacterial Vaginitis (gardnerella): NEGATIVE
Candida Glabrata: NEGATIVE
Candida Vaginitis: NEGATIVE
Chlamydia: NEGATIVE
Comment: NEGATIVE
Comment: NEGATIVE
Comment: NEGATIVE
Comment: NEGATIVE
Comment: NEGATIVE
Comment: NORMAL
Neisseria Gonorrhea: NEGATIVE
Trichomonas: NEGATIVE

## 2019-03-03 ENCOUNTER — Other Ambulatory Visit: Payer: Self-pay

## 2019-03-03 ENCOUNTER — Ambulatory Visit (HOSPITAL_COMMUNITY)
Admission: RE | Admit: 2019-03-03 | Discharge: 2019-03-03 | Disposition: A | Discharge status: Home / Self Care | Payer: Medicaid Other | Source: Ambulatory Visit | Attending: Obstetrics | Admitting: Obstetrics

## 2019-03-03 DIAGNOSIS — R102 Pelvic and perineal pain: Secondary | ICD-10-CM | POA: Insufficient documentation

## 2019-03-03 LAB — CYTOLOGY - PAP
Comment: NEGATIVE
Diagnosis: NEGATIVE
High risk HPV: NEGATIVE

## 2019-04-07 ENCOUNTER — Other Ambulatory Visit: Payer: Self-pay | Admitting: Obstetrics

## 2019-04-07 DIAGNOSIS — E559 Vitamin D deficiency, unspecified: Secondary | ICD-10-CM

## 2019-06-08 ENCOUNTER — Telehealth: Payer: Self-pay | Admitting: Rheumatology

## 2019-06-08 NOTE — Telephone Encounter (Signed)
LMOM for patient to return call.

## 2019-06-08 NOTE — Telephone Encounter (Signed)
Patient called to reschedule Friday's appointment out to February so she can be seen in office. Advised patient next follow up appointment in office is March. Patient then states she has a rash that comes, and goes for 2-3 weeks now. She has a patch on her upper arm, back, and both sides of her buttocks area. Offered for patient to come in at 8 am tomorrow, but patient states she is unable to come in at all this week, and cannot do a virtual appointment. Please call to advise.

## 2019-06-08 NOTE — Telephone Encounter (Signed)
Patient states she had to cancel her appointment for Friday. Patient states she has been developing a blister like rash on the side of her shoulder and and the side of her wrist. Patient states she is unsure if this is something new with her Lupus. Patient advised that she will need an appointment to be evaluated. Phone call was disconnected before being able to schedule. Attempted to call back and left message for patient to call the office.

## 2019-06-12 ENCOUNTER — Ambulatory Visit: Payer: Self-pay | Admitting: Rheumatology

## 2019-06-22 ENCOUNTER — Other Ambulatory Visit: Payer: Self-pay | Admitting: Obstetrics

## 2019-06-22 DIAGNOSIS — E559 Vitamin D deficiency, unspecified: Secondary | ICD-10-CM

## 2019-07-29 ENCOUNTER — Encounter: Payer: Self-pay | Admitting: Obstetrics

## 2019-07-29 ENCOUNTER — Other Ambulatory Visit: Payer: Self-pay

## 2019-07-29 ENCOUNTER — Ambulatory Visit: Payer: Medicaid Other | Admitting: Obstetrics

## 2019-07-29 VITALS — BP 110/70 | HR 71 | Ht 63.0 in | Wt 156.5 lb

## 2019-07-29 DIAGNOSIS — E559 Vitamin D deficiency, unspecified: Secondary | ICD-10-CM | POA: Diagnosis not present

## 2019-07-29 DIAGNOSIS — R350 Frequency of micturition: Secondary | ICD-10-CM | POA: Diagnosis not present

## 2019-07-29 DIAGNOSIS — E2839 Other primary ovarian failure: Secondary | ICD-10-CM | POA: Diagnosis not present

## 2019-07-29 DIAGNOSIS — M791 Myalgia, unspecified site: Secondary | ICD-10-CM | POA: Diagnosis not present

## 2019-07-29 DIAGNOSIS — J4520 Mild intermittent asthma, uncomplicated: Secondary | ICD-10-CM | POA: Diagnosis not present

## 2019-07-29 DIAGNOSIS — J301 Allergic rhinitis due to pollen: Secondary | ICD-10-CM | POA: Diagnosis not present

## 2019-07-29 DIAGNOSIS — Z0000 Encounter for general adult medical examination without abnormal findings: Secondary | ICD-10-CM

## 2019-07-29 DIAGNOSIS — T782XXD Anaphylactic shock, unspecified, subsequent encounter: Secondary | ICD-10-CM

## 2019-07-29 LAB — POCT URINALYSIS DIPSTICK
Bilirubin, UA: NEGATIVE
Blood, UA: NEGATIVE
Glucose, UA: NEGATIVE
Ketones, UA: NEGATIVE
Leukocytes, UA: NEGATIVE
Nitrite, UA: NEGATIVE
Protein, UA: NEGATIVE
Spec Grav, UA: 1.015 (ref 1.010–1.025)
Urobilinogen, UA: 0.2 E.U./dL
pH, UA: 6 (ref 5.0–8.0)

## 2019-07-29 MED ORDER — IBUPROFEN 800 MG PO TABS: 800.0000 mg | ORAL_TABLET | Freq: Three times a day (TID) | ORAL | 5 refills | Status: DC | PRN

## 2019-07-29 MED ORDER — EPINEPHRINE 0.3 MG/0.3ML IJ SOAJ: 0.3000 mg | INTRAMUSCULAR | 2 refills | Status: DC | PRN

## 2019-07-29 MED ORDER — NITROFURANTOIN MONOHYD MACRO 100 MG PO CAPS: 100.0000 mg | ORAL_CAPSULE | Freq: Two times a day (BID) | ORAL | 2 refills | Status: DC

## 2019-07-29 MED ORDER — ALBUTEROL SULFATE HFA 108 (90 BASE) MCG/ACT IN AERS: 1.0000 | INHALATION_SPRAY | RESPIRATORY_TRACT | 5 refills | Status: DC | PRN

## 2019-07-29 MED ORDER — FLUTICASONE PROPIONATE 50 MCG/ACT NA SUSP: 1.0000 | Freq: Every day | NASAL | 11 refills | Status: DC

## 2019-07-29 MED ORDER — VITAFOL GUMMIES 3.33-0.333-34.8 MG PO CHEW: 3.0000 | CHEWABLE_TABLET | Freq: Every day | ORAL | 11 refills | Status: DC

## 2019-07-29 MED ORDER — LORATADINE 10 MG PO TABS: 10.0000 mg | ORAL_TABLET | Freq: Every day | ORAL | 11 refills | Status: DC

## 2019-07-29 NOTE — Progress Notes (Signed)
Patient is in the office for possible uti, pt reports urinary urgency and pain when urinating and dark colored urine. Pt states that she has not has a menstrual cycle in about a year.

## 2019-07-29 NOTE — Progress Notes (Signed)
Patient ID: Danielle Holden, female   DOB: 08-31-1970, 49 y.o.   MRN: [id number]  Chief Complaint  Patient presents with  . GYN    HPI Danielle Holden is a 49 y.o. female.  Complains of urinary frequency. HPI  Past Medical History:  Diagnosis Date  . Abnormal uterine bleeding   . Anxiety   . Arthritis    hands, knees, shoulder, back   . Asthma   . Colitis    History of  . Complication of anesthesia 01/1992   During tubal ligation heart stopped and she stopped breathing  . Depression   . Environmental and seasonal allergies   . Fatigue   . Gallstones   . GERD (gastroesophageal reflux disease)   . Heart rate slow    per watch  . History of kidney stones   . History of shingles   . Hypoglycemia   . IBS (irritable bowel syndrome)   . Iron deficiency anemia    receive iron infusion  . Lactose intolerance   . Lupus (HCC)   . Migraine   . Numbness and tingling of both upper extremities    first thing in the morning when she wakes up  . Numbness and tingling of lower extremity    first thing in the morning when she wakes up  . RA (rheumatoid arthritis) (HCC)   . Ventral hernia   . Vertigo     Past Surgical History:  Procedure Laterality Date  . APPENDECTOMY    . CESAREAN SECTION    . CHOLECYSTECTOMY [name] 08/07/2017   Procedure: LAPAROSCOPIC CHOLECYSTECTOMY, PRIMARY REPAIR OF UMBILICAL HERNIA;  Surgeon: Andria Meuse, MD;  Location: [facility];  Service: General;  Laterality: [name];  . COLONOSCOPY    . IUD REMOVAL [name] 11/12/2014  . TUBAL LIGATION    . UMBILICAL HERNIA REPAIR      Family History  Problem Relation Age of Onset  . Suicidality Father   . Diabetes Maternal Grandmother   . Breast cancer Maternal Grandmother   . Cervical cancer Paternal Grandmother   . Heart defect Son   . Healthy Son   . Healthy Daughter     Social History Social History   Tobacco Use  . Smoking status: Never Smoker  . Smokeless tobacco: Never Used  Substance Use  Topics  . Alcohol use: No    Alcohol/week: 0.0 standard drinks  . Drug use: No    Comment: Quit in 1991    Allergies  Allergen Reactions  . Anesthetics, Amide Anaphylaxis and Other (See Comments)    Heart stopped and stopped breathing  . Benzocaine Anaphylaxis  . Black Pepper [Piper] Anaphylaxis    Throat swelling  . Mushroom Extract Complex Anaphylaxis    Mouth swelling  . Trazodone And Nefazodone Anaphylaxis    Tongue swells.  . Amoxicillin Hives and Diarrhea    Has patient had a PCN reaction causing immediate rash, facial/tongue/throat swelling, SOB or lightheadedness with hypotension: No Has patient had a PCN reaction causing severe rash involving mucus membranes or skin necrosis: Yes Has patient had a PCN reaction that required hospitalization: Yes Has patient had a PCN reaction occurring within the last 10 years: Yes If all of the above answers are "NO", then may proceed with Cephalosporin use.   . Bee Venom Swelling  . Benadryl [Diphenhydramine]     Heavy periods   . Effexor [Venlafaxine] Hives  . Lactose Intolerance (Gi)     Stomach pain  . Nortriptyline Hives  .  Nsaids     Make colitis worse  . Other Diarrhea    Red meat - stomach pains, bleeding     Current Outpatient Medications  Medication Sig Dispense Refill  . acetaminophen (TYLENOL) 325 MG tablet Take 650 mg by mouth every 6 (six) hours as needed.    . fluticasone (FLONASE) 50 MCG/ACT nasal spray Place 1 spray into both nostrils daily. 15.8 mL 11  . loratadine (CLARITIN) 10 MG tablet Take 1 tablet (10 mg total) by mouth daily. 30 tablet 11  . albuterol (VENTOLIN HFA) 108 (90 Base) MCG/ACT inhaler Inhale 1 puff into the lungs as needed for wheezing or shortness of breath. 18 g 5  . dicyclomine (BENTYL) 10 MG capsule Take 1 capsule (10 mg total) by mouth 2 (two) times daily. Take twice daily as needed. (Patient not taking: Reported on 07/29/2019) 60 capsule 5  . EPINEPHrine 0.3 mg/0.3 mL IJ SOAJ injection  Inject 0.3 mLs (0.3 mg total) into the muscle as needed for anaphylaxis. 1 each 2  . ibuprofen (ADVIL) 800 MG tablet Take 1 tablet (800 mg total) by mouth every 8 (eight) hours as needed. Take after a meal. 30 tablet 5  . nitrofurantoin, macrocrystal-monohydrate, (MACROBID) 100 MG capsule Take 1 capsule (100 mg total) by mouth 2 (two) times daily. 1 po BID x 7days 14 capsule 2  . Prenatal Vit-Fe Phos-FA-Omega (VITAFOL GUMMIES) 3.33-0.333-34.8 MG CHEW Chew 3 tablets by mouth daily before breakfast. 90 tablet 11  . traMADol (ULTRAM) 50 MG tablet Take 1 tablet (50 mg total) by mouth every 6 (six) hours as needed for moderate pain. (Patient not taking: Reported on 07/29/2019) 30 tablet 2  . Vitamin D, Ergocalciferol, (DRISDOL) 1.25 MG (50000 UNIT) CAPS capsule TAKE 1 CAPSULE BY MOUTH EVERY 7 DAYS (Patient not taking: Reported on 07/29/2019) 4 capsule 1   No current facility-administered medications for this visit.    Review of Systems Review of Systems Constitutional: negative for fatigue and weight loss Respiratory: negative for cough and wheezing Cardiovascular: negative for chest pain, fatigue and palpitations Gastrointestinal: negative for abdominal pain and change in bowel habits Genitourinary:positive for urinary frequency Integument/breast: negative for nipple discharge Musculoskeletal:negative for myalgias Neurological: negative for gait problems and tremors Behavioral/Psych: negative for abusive relationship, depression Endocrine: negative for temperature intolerance      Blood pressure 110/70, pulse 71, height 5\' 3"  (1.6 m), weight 156 lb 8 oz (71 kg).  Physical Exam Physical Exam General:   alert  Skin:   no rash or abnormalities  Lungs:   clear to auscultation bilaterally  Heart:   regular rate and rhythm, S1, S2 normal, no murmur, click, rub or gallop  Breasts:   normal without suspicious masses, skin or nipple changes or axillary nodes  Abdomen:  normal findings: no  organomegaly, soft, non-tender and no hernia  Pelvis:  External genitalia: normal general appearance Urinary system: urethral meatus normal and bladder without fullness, nontender Vaginal: normal without tenderness, induration or masses Cervix: normal appearance Adnexa: normal bimanual exam Uterus: anteverted and non-tender, normal size    50% of 15 min visit spent on counseling and coordination of care.   Data Reviewed U/A Urinalysis and C&S  Assessment     1. Urinary frequency Rx: - POCT Urinalysis Dipstick - nitrofurantoin, macrocrystal-monohydrate, (MACROBID) 100 MG capsule; Take 1 capsule (100 mg total) by mouth 2 (two) times daily. 1 po BID x 7days  Dispense: 14 capsule; Refill: 2 - Urine Culture  2. Hypoestrogenism Rx: - DG  BONE DENSITY (DXA); Future  3. Vitamin D deficiency Rx: - VITAMIN D 25 Hydroxy (Vit-D Deficiency, Fractures)  4. Myalgia Rx: - ibuprofen (ADVIL) 800 MG tablet; Take 1 tablet (800 mg total) by mouth every 8 (eight) hours as needed. Take after a meal.  Dispense: 30 tablet; Refill: 5  5. Anaphylaxis, subsequent encounter Rx: - EPINEPHrine 0.3 mg/0.3 mL IJ SOAJ injection; Inject 0.3 mLs (0.3 mg total) into the muscle as needed for anaphylaxis.  Dispense: 1 each; Refill: 2  6. Routine adult health maintenance Rx: - Prenatal Vit-Fe Phos-FA-Omega (VITAFOL GUMMIES) 3.33-0.333-34.8 MG CHEW; Chew 3 tablets by mouth daily before breakfast.  Dispense: 90 tablet; Refill: 11  7. Seasonal allergic rhinitis due to pollen Rx: - fluticasone (FLONASE) 50 MCG/ACT nasal spray; Place 1 spray into both nostrils daily.  Dispense: 15.8 mL; Refill: 11 - loratadine (CLARITIN) 10 MG tablet; Take 1 tablet (10 mg total) by mouth daily.  Dispense: 30 tablet; Refill: 11  8. Mild intermittent asthma without complication Rx: - albuterol (VENTOLIN HFA) 108 (90 Base) MCG/ACT inhaler; Inhale 1 puff into the lungs as needed for wheezing or shortness of breath.  Dispense: 18 g;  Refill: 5    Plan   Follow up in 7 months for Annual / Pap  Orders Placed This Encounter  Procedures  . Urine Culture  . DG BONE DENSITY (DXA)    INS MCD Epic ORDER/COSIGN PF NONE WT 156 DISC CALC SUPP & MV 48H PRIOR NO NEEDS / WEAR MASK AND COME ALONE / SW CBJSEGB@ [facility] / JR      Standing Status:   Future    Standing Expiration Date:   09/27/2020    Order Specific Question:   Reason for Exam (SYMPTOM  OR DIAGNOSIS REQUIRED)    Answer:   Hypoestrogenemia    Order Specific Question:   Is the patient pregnant?    Answer:   No    Order Specific Question:   Preferred imaging location?    Answer:   [facility]  . VITAMIN D 25 Hydroxy (Vit-D Deficiency, Fractures)  . POCT Urinalysis Dipstick   Meds ordered this encounter  Medications  . nitrofurantoin, macrocrystal-monohydrate, (MACROBID) 100 MG capsule    Sig: Take 1 capsule (100 mg total) by mouth 2 (two) times daily. 1 po BID x 7days    Dispense:  14 capsule    Refill:  2  . EPINEPHrine 0.3 mg/0.3 mL IJ SOAJ injection    Sig: Inject 0.3 mLs (0.3 mg total) into the muscle as needed for anaphylaxis.    Dispense:  1 each    Refill:  2  . DISCONTD: ibuprofen (ADVIL) 800 MG tablet    Sig: Take 1 tablet (800 mg total) by mouth every 8 (eight) hours as needed.    Dispense:  30 tablet    Refill:  5  . Prenatal Vit-Fe Phos-FA-Omega (VITAFOL GUMMIES) 3.33-0.333-34.8 MG CHEW    Sig: Chew 3 tablets by mouth daily before breakfast.    Dispense:  90 tablet    Refill:  11  . fluticasone (FLONASE) 50 MCG/ACT nasal spray    Sig: Place 1 spray into both nostrils daily.    Dispense:  15.8 mL    Refill:  11  . loratadine (CLARITIN) 10 MG tablet    Sig: Take 1 tablet (10 mg total) by mouth daily.    Dispense:  30 tablet    Refill:  11  . ibuprofen (ADVIL) 800 MG tablet    Sig:  Take 1 tablet (800 mg total) by mouth every 8 (eight) hours as needed. Take after a meal.    Dispense:  30 tablet    Refill:  5  . albuterol (VENTOLIN HFA)  108 (90 Base) MCG/ACT inhaler    Sig: Inhale 1 puff into the lungs as needed for wheezing or shortness of breath.    Dispense:  18 g    Refill:  5     Brock Bad, MD 07/29/2019 11:52 AM

## 2019-07-30 ENCOUNTER — Other Ambulatory Visit: Payer: Self-pay | Admitting: Obstetrics

## 2019-07-30 DIAGNOSIS — T782XXD Anaphylactic shock, unspecified, subsequent encounter: Secondary | ICD-10-CM

## 2019-07-30 DIAGNOSIS — E559 Vitamin D deficiency, unspecified: Secondary | ICD-10-CM

## 2019-07-30 LAB — VITAMIN D 25 HYDROXY (VIT D DEFICIENCY, FRACTURES): Vit D, 25-Hydroxy: 23.9 ng/mL — ABNORMAL LOW (ref 30.0–100.0)

## 2019-07-30 MED ORDER — VITAMIN D (ERGOCALCIFEROL) 1.25 MG (50000 UNIT) PO CAPS: ORAL_CAPSULE | ORAL | 0 refills | Status: DC

## 2019-07-31 ENCOUNTER — Ambulatory Visit
Admission: RE | Admit: 2019-07-31 | Discharge: 2019-07-31 | Disposition: A | Discharge status: Home / Self Care | Payer: Medicaid Other | Source: Ambulatory Visit | Attending: Obstetrics | Admitting: Obstetrics

## 2019-07-31 ENCOUNTER — Other Ambulatory Visit: Payer: Self-pay

## 2019-07-31 DIAGNOSIS — E2839 Other primary ovarian failure: Secondary | ICD-10-CM

## 2019-07-31 DIAGNOSIS — M810 Age-related osteoporosis without current pathological fracture: Secondary | ICD-10-CM | POA: Diagnosis not present

## 2019-07-31 DIAGNOSIS — Z780 Asymptomatic menopausal state: Secondary | ICD-10-CM | POA: Diagnosis not present

## 2019-07-31 DIAGNOSIS — M85852 Other specified disorders of bone density and structure, left thigh: Secondary | ICD-10-CM | POA: Diagnosis not present

## 2019-07-31 LAB — URINE CULTURE: Organism ID, Bacteria: NO GROWTH

## 2019-08-04 ENCOUNTER — Other Ambulatory Visit: Payer: Self-pay | Admitting: Obstetrics

## 2019-08-04 DIAGNOSIS — Z0000 Encounter for general adult medical examination without abnormal findings: Secondary | ICD-10-CM

## 2019-09-18 DIAGNOSIS — H524 Presbyopia: Secondary | ICD-10-CM | POA: Diagnosis not present

## 2019-09-18 DIAGNOSIS — H52223 Regular astigmatism, bilateral: Secondary | ICD-10-CM | POA: Diagnosis not present

## 2019-09-18 DIAGNOSIS — H5213 Myopia, bilateral: Secondary | ICD-10-CM | POA: Diagnosis not present

## 2019-09-24 ENCOUNTER — Other Ambulatory Visit: Payer: Self-pay | Admitting: Obstetrics

## 2019-09-24 DIAGNOSIS — T782XXD Anaphylactic shock, unspecified, subsequent encounter: Secondary | ICD-10-CM

## 2019-09-30 NOTE — Progress Notes (Signed)
Office Visit Note  Patient: Danielle Holden             Date of Birth: [birth year]           MRN: [id number]             PCP: Patient, No Pcp Per Referring: No ref. provider found Visit Date: 10/06/2019 Occupation: @GUAROCC @  Subjective:  Increased generalized pain.   History of Present Illness: Danielle Holden is a 49 y.o. female with history of myofascial pain syndrome and positive ANA.  She states she continues to have increased pain all over.  She has been experiencing some swelling in her knee joints in her fingers.  She states she has occasional discoloration in her hands.  She notices rash on her body off and on.  She has a stiffness lasting almost all day.  She has been having severe migraines to the point she has difficulty speaking at times.  Activities of Daily Living:  Patient reports morning stiffness for 24 hours.   Patient Reports nocturnal pain.  Difficulty dressing/grooming: Denies Difficulty climbing stairs: Reports Difficulty getting out of chair: Reports Difficulty using hands for taps, buttons, cutlery, and/or writing: Reports  Review of Systems  Constitutional: Positive for fatigue. Negative for night sweats, weight gain and weight loss.  HENT: Positive for mouth sores and mouth dryness. Negative for trouble swallowing, trouble swallowing and nose dryness.   Eyes: Positive for dryness. Negative for pain, redness and visual disturbance.  Respiratory: Negative for cough, shortness of breath and difficulty breathing.   Cardiovascular: Negative for chest pain, palpitations, hypertension, irregular heartbeat and swelling in legs/feet.  Gastrointestinal: Positive for blood in stool and diarrhea. Negative for constipation.  Endocrine: Negative for increased urination.  Genitourinary: Negative for difficulty urinating, painful urination and vaginal dryness.  Musculoskeletal: Positive for arthralgias, joint pain, joint swelling, myalgias, morning stiffness,  muscle tenderness and myalgias. Negative for muscle weakness.  Skin: Positive for color change and rash. Negative for hair loss, skin tightness, ulcers and sensitivity to sunlight.  Allergic/Immunologic: Negative for susceptible to infections.  Neurological: Positive for numbness and weakness. Negative for dizziness, headaches, memory loss and night sweats.  Hematological: Positive for bruising/bleeding tendency. Negative for swollen glands.  Psychiatric/Behavioral: Negative for depressed mood, confusion and sleep disturbance. The patient is not nervous/anxious.     PMFS History:  Patient Active Problem List   Diagnosis Date Noted  . History of kidney stones 05/29/2018  . History of asthma 05/29/2018  . Myofascial pain 05/29/2018  . History of IBS 05/29/2018  . History of gastroesophageal reflux (GERD) 05/29/2018  . History of TIA (transient ischemic attack) 05/29/2018  . Primary dysmenorrhea 06/13/2017  . Generalized anxiety disorder 07/26/2014  . Migraine without aura and without status migrainosus, not intractable 07/26/2014  . Hx of migraines 01/27/2014  . Migraine, unspecified, without mention of intractable migraine without mention of status migrainosus 09/28/2013  . Mastodynia, female 07/13/2013  . Abnormal uterine bleeding (AUB) 07/13/2013  . Iron deficiency anemia 06/30/2013  . TIA (transient ischemic attack) 06/27/2013  . Acute right-sided weakness 06/27/2013  . Anemia 06/27/2013  . Menorrhagia 06/27/2013  . Chest pain 06/27/2013    Past Medical History:  Diagnosis Date  . Abnormal uterine bleeding   . Anxiety   . Arthritis    hands, knees, shoulder, back   . Asthma   . Colitis    History of  . Complication of anesthesia 01/1992   During tubal ligation heart stopped and she  stopped breathing  . Depression   . Environmental and seasonal allergies   . Fatigue   . Gallstones   . GERD (gastroesophageal reflux disease)   . Heart rate slow    per watch  . History  of kidney stones   . History of shingles   . Hypoglycemia   . IBS (irritable bowel syndrome)   . Iron deficiency anemia    receive iron infusion  . Lactose intolerance   . Lupus (HCC)   . Migraine   . Numbness and tingling of both upper extremities    first thing in the morning when she wakes up  . Numbness and tingling of lower extremity    first thing in the morning when she wakes up  . RA (rheumatoid arthritis) (HCC)   . Ventral hernia   . Vertigo     Family History  Problem Relation Age of Onset  . Suicidality Father   . Diabetes Maternal Grandmother   . Breast cancer Maternal Grandmother   . Cervical cancer Paternal Grandmother   . Heart defect Son   . Healthy Son   . Healthy Daughter    Past Surgical History:  Procedure Laterality Date  . APPENDECTOMY    . CESAREAN SECTION    . CHOLECYSTECTOMY [name] 08/07/2017   Procedure: LAPAROSCOPIC CHOLECYSTECTOMY, PRIMARY REPAIR OF UMBILICAL HERNIA;  Surgeon: Andria Meuse, MD;  Location: [facility];  Service: General;  Laterality: [name];  . COLONOSCOPY    . IUD REMOVAL [name] 11/12/2014  . TUBAL LIGATION    . UMBILICAL HERNIA REPAIR     Social History   Social History Narrative   Patient lives at home alone and she is disabled.   Education 9th grade.   Right handed.   Caffeine one cup of coffee not daily.    Immunization History  Administered Date(s) Administered  . Influenza,inj,Quad PF,6+ Mos 02/11/2019     Objective: Vital Signs: BP 117/70 (BP Location: Right Arm, Patient Position: Sitting, Cuff Size: Normal)   Pulse (!) 59   Resp 13   Ht 5\' 3"  (1.6 m)   Wt 157 lb 6.4 oz (71.4 kg)   LMP 07/20/2018 (Exact Date)   BMI 27.88 kg/m    Physical Exam Vitals and nursing note reviewed.  Constitutional:      Appearance: She is well-developed.  HENT:     Head: Normocephalic and atraumatic.  Eyes:     Conjunctiva/sclera: Conjunctivae normal.  Cardiovascular:     Rate and Rhythm: Normal rate and regular rhythm.      Heart sounds: Normal heart sounds.  Pulmonary:     Effort: Pulmonary effort is normal.     Breath sounds: Normal breath sounds.  Abdominal:     General: Bowel sounds are normal.     Palpations: Abdomen is soft.  Musculoskeletal:     Cervical back: Normal range of motion.  Lymphadenopathy:     Cervical: No cervical adenopathy.  Skin:    General: Skin is warm and dry.     Capillary Refill: Capillary refill takes less than 2 seconds.     Comments: She had good capillary refill with no digital ulcers.  No nailbed capillary changes were noted.  Neurological:     Mental Status: She is alert and oriented to person, place, and time.  Psychiatric:        Behavior: Behavior normal.      Musculoskeletal Exam: C-spine thoracic and lumbar spine with good range of motion.  Shoulder joints,  elbow joints, wrist joints, MCPs PIPs and DIPs with good range of motion with no synovitis.  Hip joints, knee joints, ankles, MTPs and PIPs with good range of motion with no synovitis.  CDAI Exam: CDAI Score: -- Patient Global: --; Provider Global: -- Swollen: --; Tender: -- Joint Exam 10/06/2019   No joint exam has been documented for this visit   There is currently no information documented on the homunculus. Go to the Rheumatology activity and complete the homunculus joint exam.  Investigation: No additional findings.  Imaging: No results found.  Recent Labs: Lab Results  Component Value Date   WBC 6.4 02/20/2019   HGB 14.1 02/20/2019   PLT 270 02/20/2019   NA 141 02/20/2019   K 3.9 02/20/2019   CL 103 02/20/2019   CO2 22 02/20/2019   GLUCOSE 75 02/20/2019   BUN 9 02/20/2019   CREATININE 0.76 02/20/2019   BILITOT 0.3 02/20/2019   ALKPHOS 109 02/20/2019   AST 12 02/20/2019   ALT 9 02/20/2019   PROT 6.9 02/20/2019   ALBUMIN 4.4 02/20/2019   CALCIUM 9.3 02/20/2019   GFRAA 107 02/20/2019    Speciality Comments: No specialty comments available.  Procedures:  No procedures  performed Allergies: Anesthetics, amide; Benzocaine; Black pepper [piper]; Mushroom extract complex; Trazodone and nefazodone; Amoxicillin; Bee venom; Benadryl [diphenhydramine]; Effexor [venlafaxine]; Lactose intolerance (gi); Nortriptyline; Nsaids; and Other   Assessment / Plan:     Visit Diagnoses: Positive ANA (antinuclear antibody) - ANA 1: 160 speckled, anticardiolipin IgG 22 , AVISE index -0.9: She gives history of oral ulcers, arthralgias, joint swelling, photosensitivity, rash, possible Raynaud's.  I have not witnessed oral ulcers, joint swelling, synovitis, rash or Raynaud's in the office..  We had discussion regarding possible use of Plaquenil.  Side effects were discussed.  Patient declined the medication.  Trochanteric bursitis of both hips-she continues to have some discomfort in the trochanteric area.  Stretching exercises were emphasized.  Myofascial pain-she has generalized pain and hyperalgesia.  Anxiety and depression-she is currently not taking any medication.  History of IBS  History of gastroesophageal reflux (GERD)  History of TIA (transient ischemic attack)  Hx of migraines-she states her migraines are getting worse.  Have advised her to schedule appointment with her PCP or neurologist.  History of asthma  History of kidney stones  Orders: No orders of the defined types were placed in this encounter.  No orders of the defined types were placed in this encounter.    Follow-Up Instructions: Return in about 6 months (around 04/07/2020) for +ANA. MFPS.  Have advised her to contact me in case she develops any new symptoms.  Pollyann Savoy, MD  Note - This record has been created using Animal nutritionist.  Chart creation errors have been sought, but may not always  have been located. Such creation errors do not reflect on  the standard of medical care.

## 2019-10-06 ENCOUNTER — Encounter: Payer: Self-pay | Admitting: Rheumatology

## 2019-10-06 ENCOUNTER — Ambulatory Visit: Payer: Medicaid Other | Admitting: Rheumatology

## 2019-10-06 ENCOUNTER — Other Ambulatory Visit: Payer: Self-pay

## 2019-10-06 VITALS — BP 117/70 | HR 59 | Resp 13 | Ht 63.0 in | Wt 157.4 lb

## 2019-10-06 DIAGNOSIS — F32A Depression, unspecified: Secondary | ICD-10-CM

## 2019-10-06 DIAGNOSIS — Z8709 Personal history of other diseases of the respiratory system: Secondary | ICD-10-CM | POA: Diagnosis not present

## 2019-10-06 DIAGNOSIS — M7062 Trochanteric bursitis, left hip: Secondary | ICD-10-CM | POA: Diagnosis not present

## 2019-10-06 DIAGNOSIS — F329 Major depressive disorder, single episode, unspecified: Secondary | ICD-10-CM

## 2019-10-06 DIAGNOSIS — M7061 Trochanteric bursitis, right hip: Secondary | ICD-10-CM | POA: Diagnosis not present

## 2019-10-06 DIAGNOSIS — R768 Other specified abnormal immunological findings in serum: Secondary | ICD-10-CM | POA: Diagnosis not present

## 2019-10-06 DIAGNOSIS — Z8669 Personal history of other diseases of the nervous system and sense organs: Secondary | ICD-10-CM | POA: Diagnosis not present

## 2019-10-06 DIAGNOSIS — Z8673 Personal history of transient ischemic attack (TIA), and cerebral infarction without residual deficits: Secondary | ICD-10-CM

## 2019-10-06 DIAGNOSIS — M7918 Myalgia, other site: Secondary | ICD-10-CM | POA: Diagnosis not present

## 2019-10-06 DIAGNOSIS — Z8719 Personal history of other diseases of the digestive system: Secondary | ICD-10-CM

## 2019-10-06 DIAGNOSIS — Z87442 Personal history of urinary calculi: Secondary | ICD-10-CM | POA: Diagnosis not present

## 2019-10-06 DIAGNOSIS — F419 Anxiety disorder, unspecified: Secondary | ICD-10-CM

## 2019-10-07 ENCOUNTER — Encounter (INDEPENDENT_AMBULATORY_CARE_PROVIDER_SITE_OTHER): Payer: Self-pay

## 2019-10-29 ENCOUNTER — Other Ambulatory Visit: Payer: Self-pay | Admitting: Obstetrics

## 2019-10-29 DIAGNOSIS — E559 Vitamin D deficiency, unspecified: Secondary | ICD-10-CM

## 2019-12-16 ENCOUNTER — Other Ambulatory Visit: Payer: Self-pay | Admitting: Obstetrics

## 2019-12-16 DIAGNOSIS — E559 Vitamin D deficiency, unspecified: Secondary | ICD-10-CM

## 2020-01-22 ENCOUNTER — Other Ambulatory Visit: Payer: Self-pay | Admitting: Obstetrics

## 2020-01-22 DIAGNOSIS — E559 Vitamin D deficiency, unspecified: Secondary | ICD-10-CM

## 2020-02-23 ENCOUNTER — Other Ambulatory Visit: Payer: Self-pay | Admitting: Obstetrics

## 2020-02-23 DIAGNOSIS — Z1231 Encounter for screening mammogram for malignant neoplasm of breast: Secondary | ICD-10-CM

## 2020-03-21 ENCOUNTER — Ambulatory Visit: Payer: Medicaid Other

## 2020-03-29 NOTE — Progress Notes (Signed)
Office Visit Note  Patient: Danielle Holden             Date of Birth: 1970-09-16           MRN: [id number]             PCP: Patient, No Pcp Per Referring: No ref. provider found Visit Date: 04/11/2020 Occupation: @GUAROCC @  Subjective:  Other (patient reports new dx of osteoporosis, DEXA in 07/2019)   History of Present Illness: Danielle Holden is a 49 y.o. female with history of positive ANA, myofascial pain syndrome.  She states that she has been developing some rash on her face recently.  She also gives history of joint pain and discomfort in her elbows, hands and her knee joints.  She has some discomfort in the trochanteric bursa.  She has noticed some swelling in her elbows and her knees.  She denies any history of oral ulcers, nasal ulcers, Raynaud's phenomenon, lymphadenopathy or photosensitivity.  She continues to have generalized pain from myofascial pain syndrome.  She also was diagnosed with osteoporosis in March 2021.  She is not taking any medications at this time.  He has been taking vitamin D .  Activities of Daily Living:  Patient reports morning stiffness for all day.  Patient Denies nocturnal pain.  Difficulty dressing/grooming: Reports Difficulty climbing stairs: Denies Difficulty getting out of chair: Denies Difficulty using hands for taps, buttons, cutlery, and/or writing: Denies  Review of Systems  Constitutional: Positive for fatigue.  HENT: Positive for nosebleeds and mouth dryness. Negative for mouth sores and nose dryness.   Eyes: Positive for pain. Negative for itching and dryness.  Respiratory: Negative for shortness of breath and difficulty breathing.   Cardiovascular: Negative for chest pain and palpitations.  Gastrointestinal: Positive for diarrhea. Negative for blood in stool and constipation.  Endocrine: Negative for increased urination.  Genitourinary: Negative for difficulty urinating.  Musculoskeletal: Positive for arthralgias, joint  pain, joint swelling, myalgias, morning stiffness, muscle tenderness and myalgias.  Skin: Positive for rash. Negative for color change, redness and sensitivity to sunlight.  Allergic/Immunologic: Negative for susceptible to infections.  Neurological: Positive for dizziness, numbness and headaches. Negative for memory loss and weakness.  Hematological: Positive for bruising/bleeding tendency. Negative for swollen glands.  Psychiatric/Behavioral: Positive for sleep disturbance. Negative for depressed mood and confusion. The patient is nervous/anxious.     PMFS History:  Patient Active Problem List   Diagnosis Date Noted  . History of kidney stones 05/29/2018  . History of asthma 05/29/2018  . Myofascial pain 05/29/2018  . History of IBS 05/29/2018  . History of gastroesophageal reflux (GERD) 05/29/2018  . History of TIA (transient ischemic attack) 05/29/2018  . Primary dysmenorrhea 06/13/2017  . Generalized anxiety disorder 07/26/2014  . Migraine without aura and without status migrainosus, not intractable 07/26/2014  . Hx of migraines 01/27/2014  . Migraine, unspecified, without mention of intractable migraine without mention of status migrainosus 09/28/2013  . Mastodynia, female 07/13/2013  . Abnormal uterine bleeding (AUB) 07/13/2013  . Iron deficiency anemia 06/30/2013  . TIA (transient ischemic attack) 06/27/2013  . Acute right-sided weakness 06/27/2013  . Anemia 06/27/2013  . Menorrhagia 06/27/2013  . Chest pain 06/27/2013    Past Medical History:  Diagnosis Date  . Abnormal uterine bleeding   . Anxiety   . Arthritis    hands, knees, shoulder, back   . Asthma   . Colitis    History of  . Complication of anesthesia 01/1992   During  tubal ligation heart stopped and she stopped breathing  . Depression   . Environmental and seasonal allergies   . Fatigue   . Gallstones   . GERD (gastroesophageal reflux disease)   . Heart rate slow    per watch  . History of kidney  stones   . History of shingles   . Hypoglycemia   . IBS (irritable bowel syndrome)   . Iron deficiency anemia    receive iron infusion  . Lactose intolerance   . Lupus (HCC)   . Migraine   . Numbness and tingling of both upper extremities    first thing in the morning when she wakes up  . Numbness and tingling of lower extremity    first thing in the morning when she wakes up  . Osteoporosis    per patient, DEXA in 07/2019  . RA (rheumatoid arthritis) (HCC)   . Ventral hernia   . Vertigo     Family History  Problem Relation Age of Onset  . Suicidality Father   . Diabetes Maternal Grandmother   . Breast cancer Maternal Grandmother   . Cervical cancer Paternal Grandmother   . Heart defect Son   . Healthy Son   . Healthy Daughter    Past Surgical History:  Procedure Laterality Date  . APPENDECTOMY    . CESAREAN SECTION    . CHOLECYSTECTOMY [name] 08/07/2017   Procedure: LAPAROSCOPIC CHOLECYSTECTOMY, PRIMARY REPAIR OF UMBILICAL HERNIA;  Surgeon: Andria Meuse, MD;  Location: [facility];  Service: General;  Laterality: [name];  . COLONOSCOPY    . IUD REMOVAL [name] 11/12/2014  . TUBAL LIGATION    . UMBILICAL HERNIA REPAIR     Social History   Social History Narrative   Patient lives at home alone and she is disabled.   Education 9th grade.   Right handed.   Caffeine one cup of coffee not daily.    Immunization History  Administered Date(s) Administered  . Influenza,inj,Quad PF,6+ Mos 02/11/2019  . Influenza-Unspecified 01/13/2020  . PFIZER SARS-COV-2 Vaccination 08/01/2019, 08/22/2019, 03/04/2020  . Tetanus 01/13/2020     Objective: Vital Signs: BP 129/83 (BP Location: Right Arm, Patient Position: Sitting, Cuff Size: Normal)   Pulse (!) 59   Resp 14   Ht 5\' 3"  (1.6 m)   Wt 156 lb 9.6 oz (71 kg)   LMP 07/20/2018 (Exact Date)   BMI 27.74 kg/m    Physical Exam Vitals and nursing note reviewed.  Constitutional:      Appearance: She is well-developed.  HENT:      Head: Normocephalic and atraumatic.  Eyes:     Conjunctiva/sclera: Conjunctivae normal.  Cardiovascular:     Rate and Rhythm: Normal rate and regular rhythm.     Heart sounds: Normal heart sounds.  Pulmonary:     Effort: Pulmonary effort is normal.     Breath sounds: Normal breath sounds.  Abdominal:     General: Bowel sounds are normal.     Palpations: Abdomen is soft.  Musculoskeletal:     Cervical back: Normal range of motion.  Lymphadenopathy:     Cervical: No cervical adenopathy.  Skin:    General: Skin is warm and dry.     Capillary Refill: Capillary refill takes less than 2 seconds.     Findings: Erythema present.     Comments: She had erythema without sparing of the nasolabial folds was noted.  Neurological:     Mental Status: She is alert and oriented to person,  place, and time.  Psychiatric:        Behavior: Behavior normal.      Musculoskeletal Exam: C-spine thoracic and lumbar spine with good range of motion.  Shoulder joints, elbow joints, wrist joints, MCPs PIPs and DIPs with good range of motion with no synovitis.  Hip joints, knee joints, ankles, MTPs and PIPs with good range of motion with no synovitis.  She has some tenderness over right trochanteric bursa.  She had positive tender points and hyperalgesia.  CDAI Exam: CDAI Score: -- Patient Global: --; Provider Global: -- Swollen: --; Tender: -- Joint Exam 04/11/2020   No joint exam has been documented for this visit   There is currently no information documented on the homunculus. Go to the Rheumatology activity and complete the homunculus joint exam.  Investigation: No additional findings.  Imaging: No results found.  Recent Labs: Lab Results  Component Value Date   WBC 6.4 02/20/2019   HGB 14.1 02/20/2019   PLT 270 02/20/2019   NA 141 02/20/2019   K 3.9 02/20/2019   CL 103 02/20/2019   CO2 22 02/20/2019   GLUCOSE 75 02/20/2019   BUN 9 02/20/2019   CREATININE 0.76 02/20/2019   BILITOT 0.3  02/20/2019   ALKPHOS 109 02/20/2019   AST 12 02/20/2019   ALT 9 02/20/2019   PROT 6.9 02/20/2019   ALBUMIN 4.4 02/20/2019   CALCIUM 9.3 02/20/2019   GFRAA 107 02/20/2019    Speciality Comments: No specialty comments available.  Procedures:  No procedures performed Allergies: Anesthetics, amide; Benzocaine; Black pepper [piper]; Mushroom extract complex; Trazodone and nefazodone; Amoxicillin; Bee venom; Benadryl [diphenhydramine]; Effexor [venlafaxine]; Lactose intolerance (gi); Nortriptyline; Nsaids; and Other   Assessment / Plan:     Visit Diagnoses: Positive ANA (antinuclear antibody) - ANA 1: 160 speckled, anticardiolipin IgG 22 , AVISE index -0.9: She gives history of oral ulcers, arthralgias, joint swelling, photosensitivity, rash, possible  -patient gives history of cold sores.  She does not have typical oral ulcers associated with autoimmune disease.  She also gives history of joint swelling.  I do not see any synovitis.  She denies any photosensitivity today.  She continues to have some rash on her face.  Will obtain labs today for review.  Plan: CBC with Differential/Platelet, COMPLETE METABOLIC PANEL WITH GFR, Urinalysis, Routine w reflex microscopic, Sedimentation rate, ANA, Anti-DNA antibody, double-stranded, C3 and C4, Cardiolipin antibodies, IgG, IgM, IgA  Myofascial pain-she continues to have generalized pain and discomfort.  Need for exercise and stretching was discussed.  Other fatigue -she has been experiencing increased fatigue.  Plan: TSH  Trochanteric bursitis of both hips-IT band exercises were discussed.  Anxiety and depression-she continues to have anxiety.  She states her depression has been better.  History of IBS  History of gastroesophageal reflux (GERD)  History of TIA (transient ischemic attack)  Hx of migraines  History of asthma  History of kidney stones  Age-related osteoporosis without current pathological fracture -I reviewed her  DEXA from  July 31, 2019 with T score of -2.6.  We had detailed discussion regarding different treatment options.  I discussed possible option of Fosamax.  She does not like the side effects and would like to think about it.  I will obtain some additional labs today.  She has history of vitamin D deficiency.  She has been taking 50,000 units of vitamin D once a week.  Plan: VITAMIN D 25 Hydroxy (Vit-D Deficiency, Fractures), PTH, intact and calcium  Orders: Orders Placed This  Encounter  Procedures  . CBC with Differential/Platelet  . COMPLETE METABOLIC PANEL WITH GFR  . Urinalysis, Routine w reflex microscopic  . Sedimentation rate  . ANA  . Anti-DNA antibody, double-stranded  . C3 and C4  . Cardiolipin antibodies, IgG, IgM, IgA  . VITAMIN D 25 Hydroxy (Vit-D Deficiency, Fractures)  . PTH, intact and calcium  . TSH   No orders of the defined types were placed in this encounter.    Follow-Up Instructions: Return in about 4 weeks (around 05/09/2020) for MFPS, +ANA, OP.   Pollyann Savoy, MD  Note - This record has been created using Animal nutritionist.  Chart creation errors have been sought, but may not always  have been located. Such creation errors do not reflect on  the standard of medical care.

## 2020-04-11 ENCOUNTER — Ambulatory Visit: Payer: Medicaid Other | Admitting: Rheumatology

## 2020-04-11 ENCOUNTER — Encounter: Payer: Self-pay | Admitting: Rheumatology

## 2020-04-11 ENCOUNTER — Other Ambulatory Visit: Payer: Self-pay

## 2020-04-11 VITALS — BP 129/83 | HR 59 | Resp 14 | Ht 63.0 in | Wt 156.6 lb

## 2020-04-11 DIAGNOSIS — E559 Vitamin D deficiency, unspecified: Secondary | ICD-10-CM

## 2020-04-11 DIAGNOSIS — Z8719 Personal history of other diseases of the digestive system: Secondary | ICD-10-CM | POA: Diagnosis not present

## 2020-04-11 DIAGNOSIS — Z8669 Personal history of other diseases of the nervous system and sense organs: Secondary | ICD-10-CM | POA: Diagnosis not present

## 2020-04-11 DIAGNOSIS — Z87442 Personal history of urinary calculi: Secondary | ICD-10-CM

## 2020-04-11 DIAGNOSIS — R768 Other specified abnormal immunological findings in serum: Secondary | ICD-10-CM

## 2020-04-11 DIAGNOSIS — Z8709 Personal history of other diseases of the respiratory system: Secondary | ICD-10-CM

## 2020-04-11 DIAGNOSIS — M7062 Trochanteric bursitis, left hip: Secondary | ICD-10-CM

## 2020-04-11 DIAGNOSIS — F419 Anxiety disorder, unspecified: Secondary | ICD-10-CM | POA: Diagnosis not present

## 2020-04-11 DIAGNOSIS — R5383 Other fatigue: Secondary | ICD-10-CM

## 2020-04-11 DIAGNOSIS — Z8673 Personal history of transient ischemic attack (TIA), and cerebral infarction without residual deficits: Secondary | ICD-10-CM

## 2020-04-11 DIAGNOSIS — M810 Age-related osteoporosis without current pathological fracture: Secondary | ICD-10-CM | POA: Diagnosis not present

## 2020-04-11 DIAGNOSIS — M7918 Myalgia, other site: Secondary | ICD-10-CM | POA: Diagnosis not present

## 2020-04-11 DIAGNOSIS — M7061 Trochanteric bursitis, right hip: Secondary | ICD-10-CM

## 2020-04-11 DIAGNOSIS — F32A Depression, unspecified: Secondary | ICD-10-CM

## 2020-04-12 ENCOUNTER — Other Ambulatory Visit: Payer: Self-pay | Admitting: Obstetrics

## 2020-04-12 ENCOUNTER — Encounter: Payer: Self-pay | Admitting: Rheumatology

## 2020-04-12 DIAGNOSIS — E559 Vitamin D deficiency, unspecified: Secondary | ICD-10-CM

## 2020-04-13 ENCOUNTER — Ambulatory Visit
Admission: RE | Admit: 2020-04-13 | Discharge: 2020-04-13 | Disposition: A | Discharge status: Home / Self Care | Payer: Medicaid Other | Source: Ambulatory Visit | Attending: Obstetrics | Admitting: Obstetrics

## 2020-04-13 ENCOUNTER — Encounter: Payer: Self-pay | Admitting: Rheumatology

## 2020-04-13 ENCOUNTER — Other Ambulatory Visit: Payer: Self-pay

## 2020-04-13 DIAGNOSIS — Z1231 Encounter for screening mammogram for malignant neoplasm of breast: Secondary | ICD-10-CM

## 2020-04-14 ENCOUNTER — Encounter: Payer: Self-pay | Admitting: Rheumatology

## 2020-04-14 LAB — CBC WITH DIFFERENTIAL/PLATELET
Absolute Monocytes: 438 cells/uL (ref 200–950)
Basophils Absolute: 37 cells/uL (ref 0–200)
Basophils Relative: 0.5 %
Eosinophils Absolute: 183 cells/uL (ref 15–500)
Eosinophils Relative: 2.5 %
HCT: 43.9 % (ref 35.0–45.0)
Hemoglobin: 14.4 g/dL (ref 11.7–15.5)
Lymphs Abs: 2548 cells/uL (ref 850–3900)
MCH: 31.1 pg (ref 27.0–33.0)
MCHC: 32.8 g/dL (ref 32.0–36.0)
MCV: 94.8 fL (ref 80.0–100.0)
MPV: 10.2 fL (ref 7.5–12.5)
Monocytes Relative: 6 %
Neutro Abs: 4095 cells/uL (ref 1500–7800)
Neutrophils Relative %: 56.1 %
Platelets: 273 10*3/uL (ref 140–400)
RBC: 4.63 10*6/uL (ref 3.80–5.10)
RDW: 12 % (ref 11.0–15.0)
Total Lymphocyte: 34.9 %
WBC: 7.3 10*3/uL (ref 3.8–10.8)

## 2020-04-14 LAB — URINALYSIS, ROUTINE W REFLEX MICROSCOPIC
Bilirubin Urine: NEGATIVE
Glucose, UA: NEGATIVE
Hgb urine dipstick: NEGATIVE
Ketones, ur: NEGATIVE
Leukocytes,Ua: NEGATIVE
Nitrite: NEGATIVE
Protein, ur: NEGATIVE
Specific Gravity, Urine: 1.012 (ref 1.001–1.03)
pH: 5.5 (ref 5.0–8.0)

## 2020-04-14 LAB — COMPLETE METABOLIC PANEL WITH GFR
AG Ratio: 1.6 (calc) (ref 1.0–2.5)
ALT: 13 U/L (ref 6–29)
AST: 16 U/L (ref 10–35)
Albumin: 4.5 g/dL (ref 3.6–5.1)
Alkaline phosphatase (APISO): 89 U/L (ref 37–153)
BUN: 14 mg/dL (ref 7–25)
CO2: 26 mmol/L (ref 20–32)
Calcium: 9.1 mg/dL (ref 8.6–10.4)
Chloride: 103 mmol/L (ref 98–110)
Creat: 0.71 mg/dL (ref 0.50–1.05)
GFR, Est African American: 114 mL/min/{1.73_m2} (ref >=60)
GFR, Est Non African American: 99 mL/min/{1.73_m2} (ref >=60)
Globulin: 2.9 g/dL (calc) (ref 1.9–3.7)
Glucose, Bld: 72 mg/dL (ref 65–99)
Potassium: 3.8 mmol/L (ref 3.5–5.3)
Sodium: 138 mmol/L (ref 135–146)
Total Bilirubin: 0.4 mg/dL (ref 0.2–1.2)
Total Protein: 7.4 g/dL (ref 6.1–8.1)

## 2020-04-14 LAB — ANTI-NUCLEAR AB-TITER (ANA TITER): ANA Titer 1: 1:80 {titer} — ABNORMAL HIGH

## 2020-04-14 LAB — ANA: Anti Nuclear Antibody (ANA): POSITIVE — AB

## 2020-04-14 LAB — C3 AND C4
C3 Complement: 153 mg/dL (ref 83–193)
C4 Complement: 41 mg/dL (ref 15–57)

## 2020-04-14 LAB — TSH: TSH: 1.66 mIU/L

## 2020-04-14 LAB — CARDIOLIPIN ANTIBODIES, IGG, IGM, IGA
Anticardiolipin IgA: <2 APL-U/mL
Anticardiolipin IgG: <2 GPL-U/mL
Anticardiolipin IgM: <2 MPL-U/mL

## 2020-04-14 LAB — ANTI-DNA ANTIBODY, DOUBLE-STRANDED: ds DNA Ab: <1 IU/mL

## 2020-04-14 LAB — PTH, INTACT AND CALCIUM
Calcium: 9.1 mg/dL (ref 8.6–10.4)
PTH: 73 pg/mL — ABNORMAL HIGH (ref 14–64)

## 2020-04-14 LAB — VITAMIN D 25 HYDROXY (VIT D DEFICIENCY, FRACTURES): Vit D, 25-Hydroxy: 69 ng/mL (ref 30–100)

## 2020-04-14 LAB — SEDIMENTATION RATE: Sed Rate: 11 mm/h (ref 0–30)

## 2020-04-14 NOTE — Progress Notes (Signed)
ANA is low titer positive rest the labs are negative.  Vitamin D is normal now.  She should continue taking vitamin D supplement.  PTH is mildly elevated which is not of concern.  We will discuss results at the follow-up visit.

## 2020-04-28 NOTE — Progress Notes (Deleted)
Office Visit Note  Patient: Danielle Holden             Date of Birth: 05/27/1970           MRN: [id number]             PCP: Patient, No Pcp Per Referring: No ref. provider found Visit Date: 05/11/2020 Occupation: @GUAROCC @  Subjective:  No chief complaint on file.   History of Present Illness: Danielle Holden is a 49 y.o. female ***   Activities of Daily Living:  Patient reports morning stiffness for *** {minute/hour:19697}.   Patient {ACTIONS;DENIES/REPORTS:21021675::"Denies"} nocturnal pain.  Difficulty dressing/grooming: {ACTIONS;DENIES/REPORTS:21021675::"Denies"} Difficulty climbing stairs: {ACTIONS;DENIES/REPORTS:21021675::"Denies"} Difficulty getting out of chair: {ACTIONS;DENIES/REPORTS:21021675::"Denies"} Difficulty using hands for taps, buttons, cutlery, and/or writing: {ACTIONS;DENIES/REPORTS:21021675::"Denies"}  No Rheumatology ROS completed.   PMFS History:  Patient Active Problem List   Diagnosis Date Noted  . History of kidney stones 05/29/2018  . History of asthma 05/29/2018  . Myofascial pain 05/29/2018  . History of IBS 05/29/2018  . History of gastroesophageal reflux (GERD) 05/29/2018  . History of TIA (transient ischemic attack) 05/29/2018  . Primary dysmenorrhea 06/13/2017  . Generalized anxiety disorder 07/26/2014  . Migraine without aura and without status migrainosus, not intractable 07/26/2014  . Hx of migraines 01/27/2014  . Migraine, unspecified, without mention of intractable migraine without mention of status migrainosus 09/28/2013  . Mastodynia, female 07/13/2013  . Abnormal uterine bleeding (AUB) 07/13/2013  . Iron deficiency anemia 06/30/2013  . TIA (transient ischemic attack) 06/27/2013  . Acute right-sided weakness 06/27/2013  . Anemia 06/27/2013  . Menorrhagia 06/27/2013  . Chest pain 06/27/2013    Past Medical History:  Diagnosis Date  . Abnormal uterine bleeding   . Anxiety   . Arthritis    hands, knees, shoulder,  back   . Asthma   . Colitis    History of  . Complication of anesthesia 01/1992   During tubal ligation heart stopped and she stopped breathing  . Depression   . Environmental and seasonal allergies   . Fatigue   . Gallstones   . GERD (gastroesophageal reflux disease)   . Heart rate slow    per watch  . History of kidney stones   . History of shingles   . Hypoglycemia   . IBS (irritable bowel syndrome)   . Iron deficiency anemia    receive iron infusion  . Lactose intolerance   . Lupus (HCC)   . Migraine   . Numbness and tingling of both upper extremities    first thing in the morning when she wakes up  . Numbness and tingling of lower extremity    first thing in the morning when she wakes up  . Osteoporosis    per patient, DEXA in 07/2019  . RA (rheumatoid arthritis) (HCC)   . Ventral hernia   . Vertigo     Family History  Problem Relation Age of Onset  . Suicidality Father   . Diabetes Maternal Grandmother   . Breast cancer Maternal Grandmother   . Cervical cancer Paternal Grandmother   . Heart defect Son   . Healthy Son   . Healthy Daughter    Past Surgical History:  Procedure Laterality Date  . APPENDECTOMY    . CESAREAN SECTION    . CHOLECYSTECTOMY [name] 08/07/2017   Procedure: LAPAROSCOPIC CHOLECYSTECTOMY, PRIMARY REPAIR OF UMBILICAL HERNIA;  Surgeon: 08/09/2017, MD;  Location: [facility];  Service: General;  Laterality: [name];  . COLONOSCOPY    .  IUD REMOVAL [name] 11/12/2014  . TUBAL LIGATION    . UMBILICAL HERNIA REPAIR     Social History   Social History Narrative   Patient lives at home alone and she is disabled.   Education 9th grade.   Right handed.   Caffeine one cup of coffee not daily.    Immunization History  Administered Date(s) Administered  . Influenza,inj,Quad PF,6+ Mos 02/11/2019  . Influenza-Unspecified 01/13/2020  . PFIZER SARS-COV-2 Vaccination 08/01/2019, 08/22/2019, 03/04/2020  . Tetanus 01/13/2020     Objective: Vital  Signs: LMP 07/20/2018 (Exact Date)    Physical Exam   Musculoskeletal Exam: ***  CDAI Exam: CDAI Score: -- Patient Global: --; Provider Global: -- Swollen: --; Tender: -- Joint Exam 05/11/2020   No joint exam has been documented for this visit   There is currently no information documented on the homunculus. Go to the Rheumatology activity and complete the homunculus joint exam.  Investigation: No additional findings.  Imaging: MM 3D SCREEN BREAST BILATERAL  Result Date: 04/13/2020 CLINICAL DATA:  Screening. EXAM: DIGITAL SCREENING BILATERAL MAMMOGRAM WITH TOMO AND CAD COMPARISON:  Previous exam(s). ACR Breast Density Category c: The breast tissue is heterogeneously dense, which may obscure small masses. FINDINGS: There are no findings suspicious for malignancy. Images were processed with CAD. IMPRESSION: No mammographic evidence of malignancy. A result letter of this screening mammogram will be mailed directly to the patient. RECOMMENDATION: Screening mammogram in one year. (Code:SM-B-01Y) BI-RADS CATEGORY  1: Negative. Electronically Signed   By: Beckie Salts M.D.   On: 04/13/2020 11:10    Recent Labs: Lab Results  Component Value Date   WBC 7.3 04/11/2020   HGB 14.4 04/11/2020   PLT 273 04/11/2020   NA 138 04/11/2020   K 3.8 04/11/2020   CL 103 04/11/2020   CO2 26 04/11/2020   GLUCOSE 72 04/11/2020   BUN 14 04/11/2020   CREATININE 0.71 04/11/2020   BILITOT 0.4 04/11/2020   ALKPHOS 109 02/20/2019   AST 16 04/11/2020   ALT 13 04/11/2020   PROT 7.4 04/11/2020   ALBUMIN 4.4 02/20/2019   CALCIUM 9.1 04/11/2020   CALCIUM 9.1 04/11/2020   GFRAA 114 04/11/2020    Speciality Comments: No specialty comments available.  Procedures:  No procedures performed Allergies: Anesthetics, amide; Benzocaine; Black pepper [piper]; Mushroom extract complex; Trazodone and nefazodone; Amoxicillin; Bee venom; Benadryl [diphenhydramine]; Effexor [venlafaxine]; Lactose intolerance (gi);  Nortriptyline; Nsaids; and Other   Assessment / Plan:     Visit Diagnoses: Positive ANA (antinuclear antibody)  Myofascial pain  Other fatigue  Trochanteric bursitis of both hips  Anxiety and depression  History of IBS  History of gastroesophageal reflux (GERD)  History of TIA (transient ischemic attack)  Hx of migraines  History of asthma  History of kidney stones  Age-related osteoporosis without current pathological fracture  Vitamin D deficiency  Orders: No orders of the defined types were placed in this encounter.  No orders of the defined types were placed in this encounter.   Face-to-face time spent with patient was *** minutes. Greater than 50% of time was spent in counseling and coordination of care.  Follow-Up Instructions: No follow-ups on file.   Gearldine Bienenstock, PA-C  Note - This record has been created using Dragon software.  Chart creation errors have been sought, but may not always  have been located. Such creation errors do not reflect on  the standard of medical care.

## 2020-05-11 ENCOUNTER — Ambulatory Visit: Payer: Medicaid Other | Admitting: Physician Assistant

## 2020-05-11 NOTE — Progress Notes (Signed)
Office Visit Note  Patient: Danielle Holden             Date of Birth: [birth year]           MRN: [id number]             PCP: Patient, No Pcp Per Referring: No ref. provider found Visit Date: 05/12/2020 Occupation: @GUAROCC @  Subjective:  Discuss starting fosamax   History of Present Illness: Georgia Delsignore is a 49 y.o. female with history of positive ANA and myofascial pain.  Patient presents today to discuss starting on Fosamax.  She is aware that she is in the osteoporosis range.  She has been taking a vitamin D supplement as recommended.  She denies any recent falls or fractures.  She states that she has been experiencing episodic dizziness especially first thing in the morning when getting out of bed.  She states that the episodes typically last about 10 minutes.  She is concerned since these episodes have started to last longer over the past several months.  She does not have a PCP and has not seen a neurologist recently for further evaluation.  She does have a history of migraines which have become more frequent as well.  Patient reports that she continues to have myalgias and muscle tenderness due to myofascial pain syndrome.  She experiences intermittent pain stiffness and swelling in both knees and both hands.  She has been taking Tylenol and Advil as needed for symptomatic relief.  She continues chronic fatigue secondary to insomnia.  She has not been sleeping well at night despite taking an over-the-counter sleep aid.   She continues to have chronic sicca symptoms.  She denies any recent oral or nasal ulcerations.  She has ongoing photosensitivity and intermittent Malar rashes.  She denies any symptoms of Raynaud's recently.  She experiences intermittent palpitations which she attributes to her anxiety      Activities of Daily Living:  Patient reports morning stiffness for all day.  Patient Reports nocturnal pain.  Difficulty dressing/grooming: Reports Difficulty  climbing stairs: Reports Difficulty getting out of chair: Denies Difficulty using hands for taps, buttons, cutlery, and/or writing: Reports  Review of Systems  Constitutional: Positive for fatigue.  HENT: Positive for mouth dryness and nose dryness. Negative for mouth sores.   Eyes: Positive for dryness. Negative for pain and visual disturbance.  Respiratory: Negative for cough, hemoptysis, shortness of breath and difficulty breathing.   Cardiovascular: Negative for chest pain, palpitations, hypertension and swelling in legs/feet.  Gastrointestinal: Negative for blood in stool, constipation and diarrhea.  Endocrine: Negative for increased urination.  Genitourinary: Negative for painful urination.  Musculoskeletal: Positive for arthralgias, joint pain, joint swelling, myalgias, muscle weakness, morning stiffness, muscle tenderness and myalgias.  Skin: Positive for redness. Negative for color change, pallor, rash, hair loss, nodules/bumps, skin tightness, ulcers and sensitivity to sunlight.  Allergic/Immunologic: Negative for susceptible to infections.  Neurological: Positive for dizziness, numbness and headaches. Negative for weakness.  Hematological: Negative for swollen glands.  Psychiatric/Behavioral: Positive for sleep disturbance. Negative for depressed mood. The patient is nervous/anxious.     PMFS History:  Patient Active Problem List   Diagnosis Date Noted  . History of kidney stones 05/29/2018  . History of asthma 05/29/2018  . Myofascial pain 05/29/2018  . History of IBS 05/29/2018  . History of gastroesophageal reflux (GERD) 05/29/2018  . History of TIA (transient ischemic attack) 05/29/2018  . Primary dysmenorrhea 06/13/2017  . Generalized anxiety disorder 07/26/2014  .  Migraine without aura and without status migrainosus, not intractable 07/26/2014  . Hx of migraines 01/27/2014  . Migraine, unspecified, without mention of intractable migraine without mention of status  migrainosus 09/28/2013  . Mastodynia, female 07/13/2013  . Abnormal uterine bleeding (AUB) 07/13/2013  . Iron deficiency anemia 06/30/2013  . TIA (transient ischemic attack) 06/27/2013  . Acute right-sided weakness 06/27/2013  . Anemia 06/27/2013  . Menorrhagia 06/27/2013  . Chest pain 06/27/2013    Past Medical History:  Diagnosis Date  . Abnormal uterine bleeding   . Anxiety   . Arthritis    hands, knees, shoulder, back   . Asthma   . Colitis    History of  . Complication of anesthesia 01/1992   During tubal ligation heart stopped and she stopped breathing  . Depression   . Environmental and seasonal allergies   . Fatigue   . Gallstones   . GERD (gastroesophageal reflux disease)   . Heart rate slow    per watch  . History of kidney stones   . History of shingles   . Hypoglycemia   . IBS (irritable bowel syndrome)   . Iron deficiency anemia    receive iron infusion  . Lactose intolerance   . Lupus (HCC)   . Migraine   . Numbness and tingling of both upper extremities    first thing in the morning when she wakes up  . Numbness and tingling of lower extremity    first thing in the morning when she wakes up  . Osteoporosis    per patient, DEXA in 07/2019  . RA (rheumatoid arthritis) (HCC)   . Ventral hernia   . Vertigo     Family History  Problem Relation Age of Onset  . Suicidality Father   . Diabetes Maternal Grandmother   . Breast cancer Maternal Grandmother   . Cervical cancer Paternal Grandmother   . Heart defect Son   . Healthy Son   . Healthy Daughter    Past Surgical History:  Procedure Laterality Date  . APPENDECTOMY    . CESAREAN SECTION    . CHOLECYSTECTOMY [name] 08/07/2017   Procedure: LAPAROSCOPIC CHOLECYSTECTOMY, PRIMARY REPAIR OF UMBILICAL HERNIA;  Surgeon: Andria Meuse, MD;  Location: [facility];  Service: General;  Laterality: [name];  . COLONOSCOPY    . IUD REMOVAL [name] 11/12/2014  . TUBAL LIGATION    . UMBILICAL HERNIA REPAIR     Social  History   Social History Narrative   Patient lives at home alone and she is disabled.   Education 9th grade.   Right handed.   Caffeine one cup of coffee not daily.    Immunization History  Administered Date(s) Administered  . Influenza,inj,Quad PF,6+ Mos 02/11/2019  . Influenza-Unspecified 01/13/2020  . PFIZER SARS-COV-2 Vaccination 08/01/2019, 08/22/2019, 03/04/2020  . Tetanus 01/13/2020     Objective: Vital Signs: BP 130/86 (BP Location: Right Arm, Patient Position: Sitting, Cuff Size: Normal)   Pulse (!) 58   Resp 14   Ht 5\' 3"  (1.6 m)   Wt 157 lb 6.4 oz (71.4 kg)   LMP 07/20/2018 (Exact Date)   BMI 27.88 kg/m    Physical Exam Vitals and nursing note reviewed.  Constitutional:      Appearance: She is well-developed and well-nourished.  HENT:     Head: Normocephalic and atraumatic.  Eyes:     Extraocular Movements: EOM normal.     Conjunctiva/sclera: Conjunctivae normal.  Cardiovascular:     Pulses: Intact distal pulses.  Pulmonary:     Effort: Pulmonary effort is normal.  Abdominal:     Palpations: Abdomen is soft.  Musculoskeletal:     Cervical back: Normal range of motion.  Skin:    General: Skin is warm and dry.     Capillary Refill: Capillary refill takes less than 2 seconds.  Neurological:     Mental Status: She is alert and oriented to person, place, and time.  Psychiatric:        Mood and Affect: Mood and affect normal.        Behavior: Behavior normal.      Musculoskeletal Exam: C-spine, thoracic spine, and lumbar spine good ROM.  Shoulder joints, elbow joints, wrist joints, MCPs, PIPs, and DIPs good ROM with no synovitis.  Complete fist formation bilaterally.  Hip joints, knee joints, and ankle joints good ROM with no discomfort.  No warmth or effusion of knee joints.  No tenderness or swelling of ankle joints.   CDAI Exam: CDAI Score: -- Patient Global: --; Provider Global: -- Swollen: --; Tender: -- Joint Exam 05/12/2020   No joint exam has  been documented for this visit   There is currently no information documented on the homunculus. Go to the Rheumatology activity and complete the homunculus joint exam.  Investigation: No additional findings.  Imaging: MM 3D SCREEN BREAST BILATERAL  Result Date: 04/13/2020 CLINICAL DATA:  Screening. EXAM: DIGITAL SCREENING BILATERAL MAMMOGRAM WITH TOMO AND CAD COMPARISON:  Previous exam(s). ACR Breast Density Category c: The breast tissue is heterogeneously dense, which may obscure small masses. FINDINGS: There are no findings suspicious for malignancy. Images were processed with CAD. IMPRESSION: No mammographic evidence of malignancy. A result letter of this screening mammogram will be mailed directly to the patient. RECOMMENDATION: Screening mammogram in one year. (Code:SM-B-01Y) BI-RADS CATEGORY  1: Negative. Electronically Signed   By: Claudie Revering M.D.   On: 04/13/2020 11:10    Recent Labs: Lab Results  Component Value Date   WBC 7.3 04/11/2020   HGB 14.4 04/11/2020   PLT 273 04/11/2020   NA 138 04/11/2020   K 3.8 04/11/2020   CL 103 04/11/2020   CO2 26 04/11/2020   GLUCOSE 72 04/11/2020   BUN 14 04/11/2020   CREATININE 0.71 04/11/2020   BILITOT 0.4 04/11/2020   ALKPHOS 109 02/20/2019   AST 16 04/11/2020   ALT 13 04/11/2020   PROT 7.4 04/11/2020   ALBUMIN 4.4 02/20/2019   CALCIUM 9.1 04/11/2020   CALCIUM 9.1 04/11/2020   GFRAA 114 04/11/2020    Speciality Comments: No specialty comments available.  Procedures:  No procedures performed Allergies: Anesthetics, amide; Benzocaine; Black pepper [piper]; Mushroom extract complex; Trazodone and nefazodone; Amoxicillin; Bee venom; Benadryl [diphenhydramine]; Effexor [venlafaxine]; Lactose intolerance (gi); Nortriptyline; Nsaids; and Other   Assessment / Plan:     Visit Diagnoses: Positive ANA (antinuclear antibody) - ANA 1: 160 speckled, anticardiolipin IgG 22 , AVISE index -0.9: She gives history of oral ulcers, arthralgias,  joint swelling, photosensitivity, rash: Lab work from 04/11/2020 was reviewed with the patient today in the office and all questions were addressed.  ANA remains positive but complements were within normal limits, ESR within normal limits, and double-stranded DNA was negative.  She continues to experience intermittent arthralgias, myalgias, photosensitivity, sicca symptoms, and intermittent Malar rashes.  No malar rash noted on exam.  Discussed the importance of avoiding direct sun exposure.  She has not had any symptoms of Raynaud's.  No digital ulcerations or signs of gangrene were  noted on examination today.  She continues to experience intermittent pain in both hands and both knee joints but no synovitis was noted on examination today.  She does not fit clinical criteria for systemic lupus at this time.  She does not require immunosuppressive therapy at this time.  She was advised to continue to monitor her symptoms closely.  If she develops any new or worsening symptoms she would like to return to repeat lab work at that time.  Future orders were placed today.  She will follow-up in the office in 5-6 months. - Plan: CBC with Differential/Platelet, Urinalysis, Routine w reflex microscopic, COMPLETE METABOLIC PANEL WITH GFR, C3 and C4, Anti-DNA antibody, double-stranded, Sedimentation rate, ANA  Myofascial pain: She has ongoing myalgias and muscle tenderness.  Trapezius muscle tension and tenderness bilaterally.  Discussed massage and use of heat for symptomatic relief.  Discussed the importance of regular exercise and good sleep hygiene.  Other fatigue: Chronic but stable. Discussed the importance of regular exercise.   Trochanteric bursitis of both hips: Resolved.  No tenderness to palpation on exam.   Age-related osteoporosis without current pathological fracture - DEXA from July 31, 2019 with T score of -2.6.  Discuss DEXA results today and at her last office visit on 04/11/2020.  Different treatment  options were discussed in detail at her last office visit on 04/11/2020 with Dr. Estanislado Pandy.  Baseline lab work was obtained at that time.  Calcium was within normal limits and vitamin D was at the normal limits.  Parathyroid hormone was borderline elevated but not of concern.  Creatinine and GFR within normal limits discussed the indications, contraindications and potential side effects of fosamax in detail.  Devki reviewed the details and instructions with the patient today. All questions were addressed.  She has not had any recent falls or fractures.  She has been taking vitamin D 50,000 units once weekly.  Discussed the importance of compliance while taking calcium and vitamin D.  A prescription for Fosamax was sent to the pharmacy today.  She was advised to notify us if she cannot tolerate taking Fosamax.  Repeat DEXA will be ordered March 2023.  Dizziness - She has been experiencing episodic dizziness, which tends to be most severe first thing in the morning.  These episodes have been lasting about 10 minutes.  She has noticed these episodes with changes in position especially when turning her head to the right.  Referral to neurology placed today for further evaluation.  Plan: Ambulatory referral to Neurology  Referral of patient - Strongly encouraged the patient to establish care with a PCP. Referral placed today.  Plan: Ambulatory referral to Internal Medicine  Other medical conditions are listed as follows:   Anxiety and depression  History of IBS  History of TIA (transient ischemic attack)  History of gastroesophageal reflux (GERD)  Hx of migraines - Referral to neurology placed today. Plan: Ambulatory referral to Neurology  History of asthma  History of kidney stones    Orders: Orders Placed This Encounter  Procedures  . CBC with Differential/Platelet  . Urinalysis, Routine w reflex microscopic  . COMPLETE METABOLIC PANEL WITH GFR  . C3 and C4  . Anti-DNA antibody,  double-stranded  . Sedimentation rate  . ANA  . Ambulatory referral to Neurology  . Ambulatory referral to Internal Medicine   Meds ordered this encounter  Medications  . alendronate (FOSAMAX) 70 MG tablet    Sig: Take 1 tablet (70 mg total) by mouth once a week.  Take with a full glass of water on an empty stomach.    Dispense:  12 tablet    Refill:  0    Follow-Up Instructions: Return in about 6 months (around 11/10/2020) for Positive ANA, Myofascial pain.   Ofilia Neas, PA-C  Note - This record has been created using Dragon software.  Chart creation errors have been sought, but may not always  have been located. Such creation errors do not reflect on  the standard of medical care.

## 2020-05-12 ENCOUNTER — Ambulatory Visit (INDEPENDENT_AMBULATORY_CARE_PROVIDER_SITE_OTHER): Payer: Medicaid Other | Admitting: Physician Assistant

## 2020-05-12 ENCOUNTER — Other Ambulatory Visit: Payer: Self-pay

## 2020-05-12 ENCOUNTER — Encounter: Payer: Self-pay | Admitting: Physician Assistant

## 2020-05-12 VITALS — BP 130/86 | HR 58 | Resp 14 | Ht 63.0 in | Wt 157.4 lb

## 2020-05-12 DIAGNOSIS — Z7689 Persons encountering health services in other specified circumstances: Secondary | ICD-10-CM

## 2020-05-12 DIAGNOSIS — Z8719 Personal history of other diseases of the digestive system: Secondary | ICD-10-CM

## 2020-05-12 DIAGNOSIS — F419 Anxiety disorder, unspecified: Secondary | ICD-10-CM | POA: Diagnosis not present

## 2020-05-12 DIAGNOSIS — R42 Dizziness and giddiness: Secondary | ICD-10-CM | POA: Diagnosis not present

## 2020-05-12 DIAGNOSIS — M7061 Trochanteric bursitis, right hip: Secondary | ICD-10-CM | POA: Diagnosis not present

## 2020-05-12 DIAGNOSIS — R768 Other specified abnormal immunological findings in serum: Secondary | ICD-10-CM

## 2020-05-12 DIAGNOSIS — R5383 Other fatigue: Secondary | ICD-10-CM | POA: Diagnosis not present

## 2020-05-12 DIAGNOSIS — Z8669 Personal history of other diseases of the nervous system and sense organs: Secondary | ICD-10-CM | POA: Diagnosis not present

## 2020-05-12 DIAGNOSIS — Z87442 Personal history of urinary calculi: Secondary | ICD-10-CM | POA: Diagnosis not present

## 2020-05-12 DIAGNOSIS — F32A Depression, unspecified: Secondary | ICD-10-CM

## 2020-05-12 DIAGNOSIS — M7918 Myalgia, other site: Secondary | ICD-10-CM

## 2020-05-12 DIAGNOSIS — M810 Age-related osteoporosis without current pathological fracture: Secondary | ICD-10-CM | POA: Diagnosis not present

## 2020-05-12 DIAGNOSIS — Z8673 Personal history of transient ischemic attack (TIA), and cerebral infarction without residual deficits: Secondary | ICD-10-CM

## 2020-05-12 DIAGNOSIS — Z8709 Personal history of other diseases of the respiratory system: Secondary | ICD-10-CM | POA: Diagnosis not present

## 2020-05-12 DIAGNOSIS — M7062 Trochanteric bursitis, left hip: Secondary | ICD-10-CM

## 2020-05-12 MED ORDER — ALENDRONATE SODIUM 70 MG PO TABS: 70.0000 mg | ORAL_TABLET | ORAL | 0 refills | Status: DC

## 2020-05-17 ENCOUNTER — Other Ambulatory Visit: Payer: Self-pay | Admitting: Obstetrics

## 2020-05-17 DIAGNOSIS — E559 Vitamin D deficiency, unspecified: Secondary | ICD-10-CM

## 2020-05-18 ENCOUNTER — Encounter: Payer: Self-pay | Admitting: Neurology

## 2020-05-19 ENCOUNTER — Telehealth: Payer: Self-pay | Admitting: General Practice

## 2020-05-19 NOTE — Telephone Encounter (Signed)
Left message vcml to schedule new patient appointment

## 2020-05-19 NOTE — Telephone Encounter (Signed)
-----   Message from Lysle Rubens sent at 05/19/2020  2:31 PM EST ----- Danielle Holden,  [facility] referred this pt to [place] to establish care and they called me to follow up. Would you mind calling pt to get set up? Thank you :) Lawson Fiscal

## 2020-05-26 ENCOUNTER — Encounter: Payer: Medicaid Other | Admitting: Internal Medicine

## 2020-06-03 ENCOUNTER — Encounter: Payer: Medicaid Other | Admitting: Internal Medicine

## 2020-06-13 ENCOUNTER — Encounter: Payer: Medicaid Other | Admitting: Internal Medicine

## 2020-06-13 ENCOUNTER — Encounter: Payer: Medicaid Other | Admitting: Student

## 2020-06-22 ENCOUNTER — Other Ambulatory Visit: Payer: Self-pay | Admitting: Obstetrics

## 2020-06-22 DIAGNOSIS — E559 Vitamin D deficiency, unspecified: Secondary | ICD-10-CM

## 2020-06-28 ENCOUNTER — Encounter: Payer: Medicaid Other | Admitting: Student

## 2020-06-29 ENCOUNTER — Encounter: Payer: Medicaid Other | Admitting: Internal Medicine

## 2020-07-14 ENCOUNTER — Ambulatory Visit: Payer: Medicaid Other | Admitting: Neurology

## 2020-07-27 ENCOUNTER — Other Ambulatory Visit: Payer: Self-pay | Admitting: Obstetrics

## 2020-07-27 DIAGNOSIS — E559 Vitamin D deficiency, unspecified: Secondary | ICD-10-CM

## 2020-08-08 ENCOUNTER — Other Ambulatory Visit: Payer: Self-pay | Admitting: Obstetrics

## 2020-08-08 DIAGNOSIS — J4520 Mild intermittent asthma, uncomplicated: Secondary | ICD-10-CM

## 2020-08-09 ENCOUNTER — Other Ambulatory Visit: Payer: Self-pay | Admitting: Obstetrics

## 2020-08-09 DIAGNOSIS — J301 Allergic rhinitis due to pollen: Secondary | ICD-10-CM

## 2020-08-19 ENCOUNTER — Other Ambulatory Visit: Payer: Self-pay | Admitting: Obstetrics

## 2020-08-19 DIAGNOSIS — J301 Allergic rhinitis due to pollen: Secondary | ICD-10-CM

## 2020-08-23 ENCOUNTER — Other Ambulatory Visit: Payer: Self-pay | Admitting: Obstetrics

## 2020-08-23 DIAGNOSIS — Z0000 Encounter for general adult medical examination without abnormal findings: Secondary | ICD-10-CM

## 2020-09-13 NOTE — Progress Notes (Signed)
NEUROLOGY CONSULTATION NOTE  Danielle Holden MRN: [id number] DOB: 03/03/1971  Referring provider: Sherron Ales, PA-C Primary care provider: No PCP  Reason for consult:  migraines  Assessment/Plan:   1.  Chronic migraine without aura, without status migrainosus, not intractable 2.  Migrainous vertigo  1.  Stop Tylenol.  Limit use of pain relievers to no more than 2 days out of week to prevent risk of rebound or medication-overuse headache. 2.  Start Qulipta 60mg  daily 3.  At earliest onset of migraine, take Ubrelvy 100mg .  May repeat in 2 hours if needed. 4.  Ondansetron for nausea 5.  Keep headache diary 6.  Follow up in 6 months.   Subjective:  Danielle Holden is a 50 year old female who presents for migraines.  History supplemented by referring provider's note.  She started getting migraines at 50 years old.  Sometimes they are mild but other times may be severe.  Either temple, bilateral occipital, bifrontal, neck, pounding.  Associated blurred vision and spots, otalgia, vertigo, nausea, photophobia, phonophobia, osmophobia, face flushed.  Usually lays down to rest in dark room and may last 1-2 hours.  If she cannot rest, 3-4 hours.  Occurs every other day.  Triggers included seasonal allergies, loud noise and caffeine withdrawal.  Treats with Tylenol.  Has lupus with chronic pain syndrome.  Autoimmune workup otherwise unremarkable.  She has chronic unexplained right sided numbness of face, arm and leg previously worked up.  MRI of brain with and without contrast and MRA of head on 06/28/2013 to evaluate, were personally reviewed, and were unremarkable.  Frequency of analgesics:  Tylenol daily Current NSAIDS/analgesics:  Tylenol, ibuprofen 800mg  Current triptans:  none Current ergotamine:  none Current anti-emetic:  none Current muscle relaxants:  none Current Antihypertensive medications:  none Current Antidepressant medications:  none Current Anticonvulsant  medications:  none Current anti-CGRP:  none Current Vitamins/Herbal/Supplements:  MVI, D, prenatal, melatonin Current Antihistamines/Decongestants:  Flonase, Claritin Other therapy:  none Hormone/birth control:  none  Past NSAIDS/analgesics:  Norco, naproxen, tramadol Past abortive triptans:  Rizatriptan Past abortive ergotamine:  none Past muscle relaxants:  Flexeril Past anti-emetic:  Zofran Past antihypertensive medications:  propranolol Past antidepressant medications:  venlafaxine Past anticonvulsant medications:  topiramate Past anti-CGRP:  none Past vitamins/Herbal/Supplements:  none Past antihistamines/decongestants:  Benadryl Other past therapies:  none  Caffeine:  1 cup of diluted coffee daily. No soda Diet:  Drinks 3-4 16 oz bottles of water daily.  Lactose intolerant Exercise:  Walking Depression:  no; Anxiety:  yes Other pain:  She has chronic pain with lupus Other autoimmune labs, including sed rate, were unremarkable. Sleep hygiene:  Poor.  Trouble staying asleep.  3 to 4 hours a night.  Just started melatonin Family history of headache:  unknown      PAST MEDICAL HISTORY: Past Medical History:  Diagnosis Date  . Abnormal uterine bleeding   . Anxiety   . Arthritis    hands, knees, shoulder, back   . Asthma   . Colitis    History of  . Complication of anesthesia 01/1992   During tubal ligation heart stopped and she stopped breathing  . Depression   . Environmental and seasonal allergies   . Fatigue   . Gallstones   . GERD (gastroesophageal reflux disease)   . Heart rate slow    per watch  . History of kidney stones   . History of shingles   . Hypoglycemia   . IBS (irritable bowel syndrome)   .  Iron deficiency anemia    receive iron infusion  . Lactose intolerance   . Lupus (HCC)   . Migraine   . Numbness and tingling of both upper extremities    first thing in the morning when she wakes up  . Numbness and tingling of lower extremity    first  thing in the morning when she wakes up  . Osteoporosis    per patient, DEXA in 07/2019  . RA (rheumatoid arthritis) (HCC)   . Ventral hernia   . Vertigo     PAST SURGICAL HISTORY: Past Surgical History:  Procedure Laterality Date  . APPENDECTOMY    . CESAREAN SECTION    . CHOLECYSTECTOMY [name] 08/07/2017   Procedure: LAPAROSCOPIC CHOLECYSTECTOMY, PRIMARY REPAIR OF UMBILICAL HERNIA;  Surgeon: Andria Meuse, MD;  Location: [facility];  Service: General;  Laterality: [name];  . COLONOSCOPY    . IUD REMOVAL [name] 11/12/2014  . TUBAL LIGATION    . UMBILICAL HERNIA REPAIR      MEDICATIONS: Current Outpatient Medications on File Prior to Visit  Medication Sig Dispense Refill  . acetaminophen (TYLENOL) 325 MG tablet Take 650 mg by mouth every 6 (six) hours as needed.    Marland Kitchen albuterol (VENTOLIN HFA) 108 (90 Base) MCG/ACT inhaler INHALE 1 PUFF INTO THE LUNGS AS NEEDED FOR WHEEZING OR SHORTNESS OF BREATH. 18 each 5  . alendronate (FOSAMAX) 70 MG tablet Take 1 tablet (70 mg total) by mouth once a week. Take with a full glass of water on an empty stomach. 12 tablet 0  . Doxylamine Succinate, Sleep, (SLEEP AID PO) Take by mouth at bedtime.    Marland Kitchen EPINEPHRINE 0.3 mg/0.3 mL IJ SOAJ injection INJECT 0.3 MLS (0.3 MG TOTAL) INTO THE MUSCLE AS NEEDED FOR ANAPHYLAXIS. 2 each 2  . fluticasone (FLONASE) 50 MCG/ACT nasal spray PLACE 1 SPRAY INTO BOTH NOSTRILS DAILY. 16 mL 5  . ibuprofen (ADVIL) 800 MG tablet Take 1 tablet (800 mg total) by mouth every 8 (eight) hours as needed. Take after a meal. 30 tablet 5  . loratadine (CLARITIN) 10 MG tablet TAKE 1 TABLET BY MOUTH EVERY DAY 30 tablet 11  . Multiple Vitamin (MULTIVITAMIN PO) Take by mouth daily.    . Prenatal Vit-Fe Phos-FA-Omega (VITAFOL GUMMIES) 3.33-0.333-34.8 MG CHEW CHEW 3 TABLETS BY MOUTH DAILY BEFORE BREAKFAST. 90 tablet 4  . Vitamin D, Ergocalciferol, (DRISDOL) 1.25 MG (50000 UNIT) CAPS capsule TAKE 1 CAPSULE BY MOUTH EVERY 7 DAYS FOR 6 WEEKS. 6 capsule 0    No current facility-administered medications on file prior to visit.    ALLERGIES: Allergies  Allergen Reactions  . Anesthetics, Amide Anaphylaxis and Other (See Comments)    Heart stopped and stopped breathing  . Benzocaine Anaphylaxis  . Black Pepper [Piper] Anaphylaxis    Throat swelling  . Mushroom Extract Complex Anaphylaxis    Mouth swelling  . Trazodone And Nefazodone Anaphylaxis    Tongue swells.  . Amoxicillin Hives and Diarrhea    Has patient had a PCN reaction causing immediate rash, facial/tongue/throat swelling, SOB or lightheadedness with hypotension: No Has patient had a PCN reaction causing severe rash involving mucus membranes or skin necrosis: Yes Has patient had a PCN reaction that required hospitalization: Yes Has patient had a PCN reaction occurring within the last 10 years: Yes If all of the above answers are "NO", then may proceed with Cephalosporin use.   . Bee Venom Swelling  . Benadryl [Diphenhydramine]     Heavy periods   .  Effexor [Venlafaxine] Hives  . Lactose Intolerance (Gi)     Stomach pain  . Nortriptyline Hives  . Nsaids     Make colitis worse  . Other Diarrhea    Red meat - stomach pains, bleeding     FAMILY HISTORY: Family History  Problem Relation Age of Onset  . Suicidality Father   . Diabetes Maternal Grandmother   . Breast cancer Maternal Grandmother   . Cervical cancer Paternal Grandmother   . Heart defect Son   . Healthy Son   . Healthy Daughter     Objective:  Blood pressure 123/78, pulse (!) 58, height 5\' 3"  (1.6 m), weight 151 lb 6.4 oz (68.7 kg), last menstrual period 07/20/2018, SpO2 98 %. General: No acute distress.  Patient appears well-groomed.   Head:  Normocephalic/atraumatic Eyes:  fundi examined but not visualized Neck: supple, no paraspinal tenderness, full range of motion Back: No paraspinal tenderness Heart: regular rate and rhythm Lungs: Clear to auscultation bilaterally. Vascular: No carotid  bruits. Neurological Exam: Mental status: alert and oriented to person, place, and time, recent and remote memory intact, fund of knowledge intact, attention and concentration intact, speech fluent and not dysarthric, language intact. Cranial nerves: CN I: not tested CN II: pupils equal, round and reactive to light, visual fields intact CN III, IV, VI:  full range of motion, no nystagmus, no ptosis CN V: reduced right V1-V3 sensation CN VII: upper and lower face symmetric CN VIII: hearing intact CN IX, X: gag intact, uvula midline CN XI: sternocleidomastoid and trapezius muscles intact CN XII: tongue midline Bulk & Tone: normal, no fasciculations. Motor:  muscle strength 5/5 throughout Sensation:  Reduced pinprick and vibratory sensation right upper and lower extremities Deep Tendon Reflexes:  2+ throughout,  toes downgoing.   Finger to nose testing:  Without dysmetria.   Heel to shin:  Without dysmetria.   Gait:  Normal station and stride.  Romberg negative.    Thank you for allowing me to take part in the care of this patient.  09/19/2018, DO  CC: Shon Millet, PA-C

## 2020-09-14 ENCOUNTER — Encounter: Payer: Self-pay | Admitting: Neurology

## 2020-09-14 ENCOUNTER — Other Ambulatory Visit: Payer: Self-pay

## 2020-09-14 ENCOUNTER — Ambulatory Visit: Payer: Medicaid Other | Admitting: Neurology

## 2020-09-14 VITALS — BP 123/78 | HR 58 | Ht 63.0 in | Wt 151.4 lb

## 2020-09-14 DIAGNOSIS — G43709 Chronic migraine without aura, not intractable, without status migrainosus: Secondary | ICD-10-CM | POA: Diagnosis not present

## 2020-09-14 DIAGNOSIS — G43109 Migraine with aura, not intractable, without status migrainosus: Secondary | ICD-10-CM

## 2020-09-14 MED ORDER — UBRELVY 100 MG PO TABS: 1.0000 | ORAL_TABLET | ORAL | 5 refills | Status: DC | PRN

## 2020-09-14 MED ORDER — ONDANSETRON 4 MG PO TBDP: 4.0000 mg | ORAL_TABLET | Freq: Three times a day (TID) | ORAL | 5 refills | Status: DC | PRN

## 2020-09-14 MED ORDER — QULIPTA 60 MG PO TABS: 60.0000 mg | ORAL_TABLET | Freq: Every day | ORAL | 5 refills | Status: DC

## 2020-09-14 NOTE — Patient Instructions (Signed)
  1. Start Qulipta 60mg  daily.  Contact in 4 weeks with update and we can increase dose if needed. 2. Take [place] 100mg  at earliest onset of headache.  May repeat dose once in 2 hours if needed.  Maximum 2 tablets in 24 hours. 3. Take ondansetron for nausea 4. Stop Tylenol 5. Limit use of pain relievers to no more than 2 days out of the week.  These medications include acetaminophen, NSAIDs (ibuprofen/Advil/Motrin, naproxen/Aleve, triptans (Imitrex/sumatriptan), Excedrin, and narcotics.  This will help reduce risk of rebound headaches. 6. Be aware of common food triggers:  - Caffeine:  coffee, black tea, cola, Mt. Dew  - Chocolate  - Dairy:  aged cheeses (brie, blue, cheddar, gouda, [place], provolone, [place], Swiss, etc), chocolate milk, buttermilk, sour cream, limit eggs and yogurt  - Nuts, peanut butter  - Alcohol  - Cereals/grains:  FRESH breads (fresh bagels, sourdough, doughnuts), yeast productions  - Processed/canned/aged/cured meats (pre-packaged deli meats, hotdogs)  - MSG/glutamate:  soy sauce, flavor enhancer, pickled/preserved/marinated foods  - Sweeteners:  aspartame (Equal, Nutrasweet).  Sugar and Splenda are okay  - Vegetables:  legumes (lima beans, lentils, snow peas, fava beans, pinto peans, peas, garbanzo beans), sauerkraut, onions, olives, pickles  - Fruit:  avocados, bananas, citrus fruit (orange, lemon, grapefruit), mango  - Other:  Frozen meals, macaroni and cheese 7. Routine exercise 8. Stay adequately hydrated (aim for 64 oz water daily) 9. Keep headache diary 10. Maintain proper stress management 11. Maintain proper sleep hygiene 12. Do not skip meals 13. Consider supplements:  magnesium citrate 400mg  daily, riboflavin 400mg  daily, coenzyme Q10 100mg  three times daily.

## 2020-09-15 ENCOUNTER — Other Ambulatory Visit: Payer: Self-pay

## 2020-09-15 ENCOUNTER — Telehealth: Payer: Self-pay | Admitting: Neurology

## 2020-09-15 NOTE — Telephone Encounter (Signed)
Pt is going to contact pharmacy

## 2020-09-15 NOTE — Telephone Encounter (Signed)
Patient called and said her Danielle Holden was sent to a pharmacy she is not familiar with. Please advise patient.  Correct pharmacy is: [facility] on [address]

## 2020-09-19 NOTE — Progress Notes (Signed)
  PA Case: [id number], Status: Approved, Coverage Starts on: 09/19/2020 12:00:00 AM, Coverage Ends on: 12/18/2020 12:00:00 AM key number V - PA Case ID: [id number] Drug Qulipta 60MG  tablets Form [facility] Electronic [facility] Form [phone] NCPDP).

## 2020-09-20 NOTE — Progress Notes (Signed)
Danielle Holden is Available without authorization Per Cover my MEDS

## 2020-09-22 ENCOUNTER — Other Ambulatory Visit: Payer: Self-pay | Admitting: Obstetrics

## 2020-09-22 DIAGNOSIS — E559 Vitamin D deficiency, unspecified: Secondary | ICD-10-CM

## 2020-09-29 ENCOUNTER — Telehealth: Payer: Self-pay | Admitting: Neurology

## 2020-09-29 NOTE — Telephone Encounter (Signed)
New messsage   Checking on prior authorization  Ubrogepant (UBRELVY) 100 MG TABS  Form fax over [phone]

## 2020-09-30 NOTE — Telephone Encounter (Signed)
Working on appeal

## 2020-10-27 NOTE — Progress Notes (Signed)
Office Visit Note  Patient: Danielle Holden             Date of Birth: 1970-07-02           MRN: [id number]             PCP: Patient, No Pcp Per (Inactive) Referring: No ref. provider found Visit Date: 11/10/2020 Occupation: @GUAROCC @  Subjective:  Low back pain   History of Present Illness: Kenlei Safi is a 50 y.o. female with history of positive ANA, myofascial pain, and osteoporosis.  According to the patient after her last office visit she did not start on Fosamax due to misunderstanding the potential for osteonecrosis of the jaw.  According to the patient she does not have a dentist so she did not think she was able to start on Fosamax.  She has been taking a vitamin D supplement as recommended.  She denies any falls or fractures.  She does experience symptoms of reflux at times that she is concerned about starting on Fosamax at this time.  According to the patient she has sensitivity to many medications so is unsure if she will be able to tolerate Fosamax. According to the patient she was evaluated in the ED on 11/02/2020 due to experiencing right-sided flank pain.  She was concerned that she had a kidney stone.  She underwent a thorough work-up with lab work as well as imaging.  According to the patient she was found to have a bulging disc.  She tried taking methocarbamol but discontinued due to experiencing GI upset.  She has been taking Tylenol 650 mg 1-2 times daily as needed for pain relief.  She continues to have some generalized myalgias and joint stiffness.  She experiences intermittent pain and swelling in her right knee joint. She is also having discomfort on the right side of her hip.  She denies any groin pain.   She has occasional rashes but denies any hair loss or symptom of raynaud's.  She denies any oral or nasal ulcers.  She has ongoing sicca symptoms.  She denies any shortness of breath or pleuritic chest pain .  Activities of Daily Living:  Patient reports  morning stiffness for 4-5 hours.   Patient Reports nocturnal pain.  Difficulty dressing/grooming: Reports Difficulty climbing stairs: Reports Difficulty getting out of chair: Denies Difficulty using hands for taps, buttons, cutlery, and/or writing: Reports  Review of Systems  Constitutional:  Positive for fatigue.  HENT:  Positive for mouth dryness and nose dryness. Negative for mouth sores.   Eyes:  Positive for dryness. Negative for pain, itching and visual disturbance.  Respiratory:  Negative for cough, hemoptysis, shortness of breath and difficulty breathing.   Cardiovascular:  Positive for palpitations. Negative for chest pain, hypertension and swelling in legs/feet.  Gastrointestinal:  Positive for diarrhea. Negative for blood in stool and constipation.  Endocrine: Positive for increased urination.  Genitourinary:  Negative for difficulty urinating and painful urination.  Musculoskeletal:  Positive for joint pain, joint pain, joint swelling, myalgias, morning stiffness, muscle tenderness and myalgias. Negative for muscle weakness.  Skin:  Negative for color change, pallor, rash, hair loss, nodules/bumps, redness, skin tightness, ulcers and sensitivity to sunlight.  Neurological:  Positive for headaches. Negative for memory loss.  Hematological:  Positive for bruising/bleeding tendency. Negative for swollen glands.  Psychiatric/Behavioral:  Negative for depressed mood, confusion and sleep disturbance. The patient is nervous/anxious.    PMFS History:  Patient Active Problem List   Diagnosis Date Noted  History of kidney stones 05/29/2018   History of asthma 05/29/2018   Myofascial pain 05/29/2018   History of IBS 05/29/2018   History of gastroesophageal reflux (GERD) 05/29/2018   History of TIA (transient ischemic attack) 05/29/2018   Primary dysmenorrhea 06/13/2017   Generalized anxiety disorder 07/26/2014   Migraine without aura and without status migrainosus, not intractable  07/26/2014   Hx of migraines 01/27/2014   Migraine, unspecified, without mention of intractable migraine without mention of status migrainosus 09/28/2013   Mastodynia, female 07/13/2013   Abnormal uterine bleeding (AUB) 07/13/2013   Iron deficiency anemia 06/30/2013   TIA (transient ischemic attack) 06/27/2013   Acute right-sided weakness 06/27/2013   Anemia 06/27/2013   Menorrhagia 06/27/2013   Chest pain 06/27/2013    Past Medical History:  Diagnosis Date   Abnormal uterine bleeding    Anxiety    Arthritis    hands, knees, shoulder, back    Asthma    Colitis    History of   Complication of anesthesia 01/1992   During tubal ligation heart stopped and she stopped breathing   Depression    Environmental and seasonal allergies    Fatigue    Gallstones    GERD (gastroesophageal reflux disease)    Heart rate slow    per watch   History of kidney stones    History of shingles    Hypoglycemia    IBS (irritable bowel syndrome)    Iron deficiency anemia    receive iron infusion   Lactose intolerance    Lumbar herniated disc    per patient   Lupus (HCC)    Migraine    Numbness and tingling of both upper extremities    first thing in the morning when she wakes up   Numbness and tingling of lower extremity    first thing in the morning when she wakes up   Osteoporosis    per patient, DEXA in 07/2019   RA (rheumatoid arthritis) ([place])    Ventral hernia    Vertigo     Family History  Problem Relation Age of Onset   Suicidality Father    Diabetes Maternal Grandmother    Breast cancer Maternal Grandmother    Cervical cancer Paternal Grandmother    Heart defect Son    Healthy Son    Healthy Daughter    Past Surgical History:  Procedure Laterality Date   APPENDECTOMY     CESAREAN SECTION     CHOLECYSTECTOMY [name] 08/07/2017   Procedure: LAPAROSCOPIC CHOLECYSTECTOMY, PRIMARY REPAIR OF UMBILICAL HERNIA;  Surgeon: Ileana Roup, MD;  Location: [facility];  Service:  General;  Laterality: [name];   COLONOSCOPY     IUD REMOVAL [name] 11/12/2014   TUBAL LIGATION     UMBILICAL HERNIA REPAIR     Social History   Social History Narrative   Patient lives at home alone and she is disabled.   Education 9th grade.   Right handed.   Caffeine one cup of coffee not daily.    Immunization History  Administered Date(s) Administered   Influenza,inj,Quad PF,6+ Mos 02/11/2019   Influenza-Unspecified 01/13/2020   PFIZER(Purple Top)SARS-COV-2 Vaccination 08/01/2019, 08/22/2019, 03/04/2020   Tetanus 01/13/2020     Objective: Vital Signs: BP 121/77 (BP Location: Left Arm, Patient Position: Sitting, Cuff Size: Normal)   Pulse 67   Resp 14   Ht 5' 3.5" (1.613 m)   Wt 154 lb 12.8 oz (70.2 kg)   LMP 07/20/2018 (Exact Date)   BMI 26.99  kg/m    Physical Exam Vitals and nursing note reviewed.  Constitutional:      Appearance: She is well-developed.  HENT:     Head: Normocephalic and atraumatic.  Eyes:     Conjunctiva/sclera: Conjunctivae normal.  Pulmonary:     Effort: Pulmonary effort is normal.  Abdominal:     Palpations: Abdomen is soft.  Musculoskeletal:     Cervical back: Normal range of motion.  Skin:    General: Skin is warm and dry.     Capillary Refill: Capillary refill takes less than 2 seconds.  Neurological:     Mental Status: She is alert and oriented to person, place, and time.  Psychiatric:        Behavior: Behavior normal.     Musculoskeletal Exam: Generalized hyperalgesia and positive tender points on examination today.  C-spine is slightly limited range of motion with lateral rotation.  Discomfort with lumbar spine range of motion.  Tenderness over the right trochanteric bursa.  Shoulder joints, elbow joints, wrist joints, MCPs, PIPs, DIPs have good range of motion with no synovitis.  She was able to make a complete fist bilaterally.  Hip joints have good range of motion with no discomfort in the groin.  Knee joints have good range of  motion with no warmth or effusion.  She has painful range of motion of the right knee.  Ankle joints have good range of motion with no tenderness or joint swelling.  CDAI Exam: CDAI Score: -- Patient Global: --; Provider Global: -- Swollen: --; Tender: -- Joint Exam 11/10/2020   No joint exam has been documented for this visit   There is currently no information documented on the homunculus. Go to the Rheumatology activity and complete the homunculus joint exam.  Investigation: No additional findings.  Imaging: CT L-SPINE NO CHARGE  Result Date: 11/02/2020 CLINICAL DATA:  Right flank pain with burning with urination for 3 days. History of kidney stones. EXAM: CT LUMBAR SPINE WITHOUT CONTRAST TECHNIQUE: Multiplanar CT image reconstructions of the lumbar spine were generated from the abdominopelvic CT performed concurrently. Those findings are dictated separately. COMPARISON:  Abdominopelvic CT 04/16/2018. FINDINGS: Segmentation: There are 5 lumbar type vertebral bodies. Alignment: Normal. Vertebrae: No evidence of acute fracture or traumatic subluxation. No pars defect. Mild sacroiliac degenerative changes bilaterally. Paraspinal and other soft tissues: Unremarkable. Disc levels: Mild disc bulging and vacuum phenomenon at L5-S1. No evidence of disc herniation, spinal stenosis or nerve root encroachment. IMPRESSION: 1. No acute lumbar spine findings or explanation for the patient's symptoms. 2. Mild disc degeneration and bulging at L5-S1. Electronically Signed   By: Richardean Sale M.D.   On: 11/02/2020 12:57   CT Renal Stone Study  Result Date: 11/02/2020 CLINICAL DATA:  Right flank pain with burning with urination for 3 days. History of kidney stones. EXAM: CT ABDOMEN AND PELVIS WITHOUT CONTRAST TECHNIQUE: Multidetector CT imaging of the abdomen and pelvis was performed following the standard protocol without IV contrast. COMPARISON:  Abdominopelvic CT 04/16/2018. FINDINGS: Lower chest: Minimal  atelectasis at both lung bases. No significant pleural or pericardial effusion. Stable small hiatal hernia. Hepatobiliary: The liver appears unremarkable as imaged in the noncontrast state. Mild extrahepatic biliary dilatation is similar to the prior study and within physiologic limits post cholecystectomy. Pancreas: Unremarkable. No pancreatic ductal dilatation or surrounding inflammatory changes. Spleen: Normal in size without focal abnormality. Adrenals/Urinary Tract: Both adrenal glands appear normal. Both kidneys appear unremarkable as imaged in the noncontrast state. No evidence of urinary tract  calculus, hydronephrosis or perinephric soft tissue stranding. The bladder appears normal. Stomach/Bowel: No enteric contrast administered. The stomach appears unremarkable for its degree of distension. No evidence of bowel wall thickening, distention or surrounding inflammatory change. Vascular/Lymphatic: There are no enlarged abdominal or pelvic lymph nodes. No significant vascular findings on noncontrast imaging. Reproductive: The uterus and ovaries appear unremarkable. No adnexal mass. Other: Stable small umbilical and right supraumbilical hernias containing only fat. No ascites. Musculoskeletal: No acute or significant osseous findings. Mild sacroiliac degenerative changes bilaterally. Lumbar spine findings dictated separately. IMPRESSION: 1. No evidence of urinary tract calculus, hydronephrosis or perinephric soft tissue stranding. 2. No acute findings or explanation for the patient's symptoms. 3. Previous cholecystectomy and appendectomy. 4. Stable small umbilical and right supraumbilical hernias containing only fat Electronically Signed   By: Richardean Sale M.D.   On: 11/02/2020 12:53   US PELVIC COMPLETE WITH TRANSVAGINAL  Result Date: 11/02/2020 CLINICAL DATA:  50 year old female with right-sided pelvic pain. EXAM: TRANSABDOMINAL AND TRANSVAGINAL ULTRASOUND OF PELVIS TECHNIQUE: Both transabdominal and  transvaginal ultrasound examinations of the pelvis were performed. Transabdominal technique was performed for global imaging of the pelvis including uterus, ovaries, adnexal regions, and pelvic cul-de-sac. It was necessary to proceed with endovaginal exam following the transabdominal exam to visualize the endometrium and ovaries. COMPARISON:  Pelvic ultrasound dated 03/03/2019. FINDINGS: Uterus Measurements: 6.1 x 3.1 x 3.6 cm = volume: 60 mL. The uterus is anteverted and retroflexed. The uterus demonstrates a heterogeneous echotexture. No focal mass. Endometrium Thickness: 4 mm.  No focal abnormality visualized. Right ovary Measurements: 2.6 x 0.9 x 1.7 cm = volume: 1.6 mL. Normal appearance/no adnexal mass. Left ovary Measurements: 2.0 x 1.2 x 1.4 cm = volume: 2.1 mL. Normal appearance/no adnexal mass. Other findings Small free fluid within the cul-de-sac. IMPRESSION: Heterogeneous uterus, otherwise unremarkable pelvic ultrasound. Electronically Signed   By: Anner Crete M.D.   On: 11/02/2020 15:31    Recent Labs: Lab Results  Component Value Date   WBC 7.0 11/02/2020   HGB 14.0 11/02/2020   PLT 255 11/02/2020   NA 141 11/02/2020   K 3.9 11/02/2020   CL 107 11/02/2020   CO2 25 11/02/2020   GLUCOSE 84 11/02/2020   BUN 11 11/02/2020   CREATININE 0.68 11/02/2020   BILITOT 0.6 11/02/2020   ALKPHOS 77 11/02/2020   AST 22 11/02/2020   ALT 20 11/02/2020   PROT 7.4 11/02/2020   ALBUMIN 4.2 11/02/2020   CALCIUM 9.2 11/02/2020   GFRAA 114 04/11/2020    Speciality Comments: No specialty comments available.  Procedures:  No procedures performed Allergies: Anesthetics, amide; Benzocaine; Black pepper [piper]; Mushroom extract complex; Trazodone and nefazodone; Amoxicillin; Bee venom; Benadryl [diphenhydramine]; Effexor [venlafaxine]; Lactose intolerance (gi); Nortriptyline; Nsaids; and Other   Assessment / Plan:     Visit Diagnoses: Positive ANA (antinuclear antibody) - ANA 1: 160 speckled,  anticardiolipin IgG 22 , AVISE index -0.9: She gives history of oral ulcers, arthralgias, joint swelling, photosensitivity, rash: She has no clinical features of systemic lupus at this time.  She continues to have occasional arthralgias, myalgias, joint stiffness, fatigue, and occasional rashes.  She has no synovitis on examination today.  No Maller rash was noted.  She does not have any oral or nasal ulcerations.  She has not had any symptoms of Raynaud's recently.  She has ongoing sicca symptoms which are unchanged. Lab work from 04/11/2020 was reviewed today in the office: Double-stranded a negative, complements within normal limits, ESR within normal limits,  anticardiolipin antibodies negative, vitamin D 69, TSH within normal limits, ANA 1: 80 nuclear, dense fine speckled.  Lab results were reviewed with the patient today in the office and all questions were addressed.  We will check AVISE lab work, so orders were provided to the patient today. She does not require immunosuppressive therapy at this time.  She was advised to notify us if she develops any new or worsening symptoms. She will follow up in 6 months.   Myofascial pain: She has generalized hyperalgesia and positive tender points on examination today.  She continues to experience generalized myalgias and muscle tenderness.  We discussed the importance of regular exercise and good sleep hygiene.  Referral to physical therapy will be placed today.  Other fatigue -Chronic but stable.  Discussed the importance of regular exercise.  Trochanteric bursitis of both hips -She experiences occasional discomfort due to trochanter bursitis of both hips.  She has tenderness palpation over the right trochanter bursa on examination today.  She is encouraged to perform stretching exercises on a daily basis.  She was given a handout of exercises to perform.  A referral to physical therapy will be placed today.  Age-related osteoporosis without current  pathological fracture - DEXA from July 31, 2019 with T score of -2.6.  DEXA results and treatment options were discussed in detail at the patient's office visit on 05/13/2020.  The plan was to start her on Fosamax but she never started taking it due to the apprehension of potential side effects.  According to the patient she does not have a dentist and misunderstood the risk of osteonecrosis.  She does not have any upcoming dental procedures.  The indications, contraindications, potential side effects of Fosamax were discussed again today in detail. Knox Saliva, RPH also reviewed potential side effects today.  She was advised to notify us if she cannot tolerate taking Fosamax.  She was encouraged to continue to take a vitamin D supplement as recommended.  Vitamin D was 69 on 04/11/2020.  CBC and CMP were updated on 11/02/2020 and results were reviewed with the patient today in the office.  A new prescription for Fosamax will be sent to the pharmacy today.  She will be due to update her DEXA in March 2023.  Vitamin D deficiency - Vitamin D was 69 on 04/11/20. She is taking vitamin D 50,000 units once weekly.   Chronic pain of right knee: She experiences intermittent discomfort in the right knee joint.  According to the patient in the past she had knee joint aspirations performed.  She has never had a cortisone injection.  She cannot tolerate taking NSAIDs.  She has been taking Tylenol 650 mg 1 to 2 tablets by mouth daily as needed for pain relief.  On examination she has good range of motion with discomfort in the right knee.  No warmth or effusion was noted.  She was given a handout of knee joint exercises to perform.  A referral to physical therapy will also be placed today.  DDD (degenerative disc disease), lumbar: She presented to the ED on 11/02/2020 with right-sided lower back pain.  She underwent a thorough work-up with lab work and imaging at that time.  She had a CT of the lumbar spine on 11/02/2020  which revealed mild disc degeneration and bulging at L5-S1.  No acute lumbar spine findings were noted.  She was given a prescription for methocarbamol which she cannot tolerate.  She has been taking Tylenol 650 mg 1 to  2 tablets daily as needed for pain relief.  She continues to persistent right-sided lower back pain.  Referral to physical therapy will be placed today.  Other medical conditions are listed as follows:   Dizziness - Evaluated by Dr. Tomi Likens.   Anxiety and depression  History of gastroesophageal reflux (GERD)  History of TIA (transient ischemic attack)  History of asthma  History of kidney stones  Hx of migraines - Followed by Dr. Tomi Likens.   History of IBS   Orders: Orders Placed This Encounter  Procedures   VITAMIN D 25 Hydroxy (Vit-D Deficiency, Fractures)   No orders of the defined types were placed in this encounter.     Follow-Up Instructions: Return in about 6 months (around 05/12/2021) for ANA+, Myofascial pain.   Ofilia Neas, PA-C  Note - This record has been created using Dragon software.  Chart creation errors have been sought, but may not always  have been located. Such creation errors do not reflect on  the standard of medical care.

## 2020-11-02 ENCOUNTER — Emergency Department (HOSPITAL_COMMUNITY): Payer: Medicaid Other

## 2020-11-02 ENCOUNTER — Other Ambulatory Visit: Payer: Self-pay

## 2020-11-02 ENCOUNTER — Emergency Department (HOSPITAL_COMMUNITY)
Admission: EM | Admit: 2020-11-02 | Discharge: 2020-11-02 | Disposition: A | Discharge status: Home / Self Care | Payer: Medicaid Other | Attending: Emergency Medicine | Admitting: Emergency Medicine

## 2020-11-02 ENCOUNTER — Encounter (HOSPITAL_COMMUNITY): Payer: Self-pay | Admitting: *Deleted

## 2020-11-02 DIAGNOSIS — Z7951 Long term (current) use of inhaled steroids: Secondary | ICD-10-CM | POA: Diagnosis not present

## 2020-11-02 DIAGNOSIS — M545 Low back pain, unspecified: Secondary | ICD-10-CM | POA: Diagnosis not present

## 2020-11-02 DIAGNOSIS — R102 Pelvic and perineal pain: Secondary | ICD-10-CM | POA: Insufficient documentation

## 2020-11-02 DIAGNOSIS — R109 Unspecified abdominal pain: Secondary | ICD-10-CM | POA: Insufficient documentation

## 2020-11-02 DIAGNOSIS — K449 Diaphragmatic hernia without obstruction or gangrene: Secondary | ICD-10-CM | POA: Diagnosis not present

## 2020-11-02 DIAGNOSIS — N858 Other specified noninflammatory disorders of uterus: Secondary | ICD-10-CM | POA: Diagnosis not present

## 2020-11-02 DIAGNOSIS — Z87442 Personal history of urinary calculi: Secondary | ICD-10-CM | POA: Diagnosis not present

## 2020-11-02 DIAGNOSIS — K429 Umbilical hernia without obstruction or gangrene: Secondary | ICD-10-CM | POA: Diagnosis not present

## 2020-11-02 DIAGNOSIS — M549 Dorsalgia, unspecified: Secondary | ICD-10-CM | POA: Diagnosis not present

## 2020-11-02 DIAGNOSIS — J45909 Unspecified asthma, uncomplicated: Secondary | ICD-10-CM | POA: Insufficient documentation

## 2020-11-02 DIAGNOSIS — M533 Sacrococcygeal disorders, not elsewhere classified: Secondary | ICD-10-CM | POA: Diagnosis not present

## 2020-11-02 LAB — COMPREHENSIVE METABOLIC PANEL
ALT: 20 U/L (ref 0–44)
AST: 22 U/L (ref 15–41)
Albumin: 4.2 g/dL (ref 3.5–5.0)
Alkaline Phosphatase: 77 U/L (ref 38–126)
Anion gap: 9 (ref 5–15)
BUN: 11 mg/dL (ref 6–20)
CO2: 25 mmol/L (ref 22–32)
Calcium: 9.2 mg/dL (ref 8.9–10.3)
Chloride: 107 mmol/L (ref 98–111)
Creatinine, Ser: 0.68 mg/dL (ref 0.44–1.00)
GFR, Estimated: >60 mL/min (ref >60)
Glucose, Bld: 84 mg/dL (ref 70–99)
Potassium: 3.9 mmol/L (ref 3.5–5.1)
Sodium: 141 mmol/L (ref 135–145)
Total Bilirubin: 0.6 mg/dL (ref 0.3–1.2)
Total Protein: 7.4 g/dL (ref 6.5–8.1)

## 2020-11-02 LAB — URINALYSIS, ROUTINE W REFLEX MICROSCOPIC
Bilirubin Urine: NEGATIVE
Glucose, UA: NEGATIVE mg/dL
Hgb urine dipstick: NEGATIVE
Ketones, ur: NEGATIVE mg/dL
Leukocytes,Ua: NEGATIVE
Nitrite: NEGATIVE
Protein, ur: NEGATIVE mg/dL
Specific Gravity, Urine: 1.008 (ref 1.005–1.030)
pH: 5 (ref 5.0–8.0)

## 2020-11-02 LAB — CBC WITH DIFFERENTIAL/PLATELET
Abs Immature Granulocytes: 0.02 10*3/uL (ref 0.00–0.07)
Basophils Absolute: 0 10*3/uL (ref 0.0–0.1)
Basophils Relative: 1 %
Eosinophils Absolute: 0.2 10*3/uL (ref 0.0–0.5)
Eosinophils Relative: 3 %
HCT: 42.5 % (ref 36.0–46.0)
Hemoglobin: 14 g/dL (ref 12.0–15.0)
Immature Granulocytes: 0 %
Lymphocytes Relative: 30 %
Lymphs Abs: 2.1 10*3/uL (ref 0.7–4.0)
MCH: 32.1 pg (ref 26.0–34.0)
MCHC: 32.9 g/dL (ref 30.0–36.0)
MCV: 97.5 fL (ref 80.0–100.0)
Monocytes Absolute: 0.4 10*3/uL (ref 0.1–1.0)
Monocytes Relative: 6 %
Neutro Abs: 4.2 10*3/uL (ref 1.7–7.7)
Neutrophils Relative %: 60 %
Platelets: 255 10*3/uL (ref 150–400)
RBC: 4.36 MIL/uL (ref 3.87–5.11)
RDW: 12.8 % (ref 11.5–15.5)
WBC: 7 10*3/uL (ref 4.0–10.5)
nRBC: 0 % (ref 0.0–0.2)

## 2020-11-02 LAB — WET PREP, GENITAL
Clue Cells Wet Prep HPF POC: NONE SEEN
Sperm: NONE SEEN
Trich, Wet Prep: NONE SEEN
Yeast Wet Prep HPF POC: NONE SEEN

## 2020-11-02 MED ORDER — ONDANSETRON 4 MG PO TBDP
4.0000 mg | ORAL_TABLET | Freq: Once | ORAL | Status: AC
  Administered 2020-11-02: 4 mg via ORAL
  Filled 2020-11-02: qty 1

## 2020-11-02 MED ORDER — METHOCARBAMOL 500 MG PO TABS: 500.0000 mg | ORAL_TABLET | Freq: Two times a day (BID) | ORAL | 0 refills | Status: DC

## 2020-11-02 MED ORDER — ACETAMINOPHEN 325 MG PO TABS
650.0000 mg | ORAL_TABLET | Freq: Once | ORAL | Status: AC
  Administered 2020-11-02: 650 mg via ORAL
  Filled 2020-11-02: qty 2

## 2020-11-02 MED ORDER — FENTANYL CITRATE (PF) 100 MCG/2ML IJ SOLN
50.0000 ug | Freq: Once | INTRAMUSCULAR | Status: AC
  Administered 2020-11-02: 50 ug via INTRAVENOUS
  Filled 2020-11-02: qty 2

## 2020-11-02 NOTE — ED Triage Notes (Signed)
Pt complains of right flank pain, burning w/ urination in morning and night x 3 days. Hx of kidney stones.

## 2020-11-02 NOTE — Discharge Instructions (Addendum)
You were evaluated in the Emergency Department and after careful evaluation, we did not find any emergent condition requiring admission or further testing in the hospital.  Please continue to take Tylenol, you may use the muscle relaxer as needed, take at night do not drink or drive the medication as it can make you sleepy.    Please return to the Emergency Department if you experience any worsening of your condition.  We encourage you to follow up with a primary care provider.  Thank you for allowing [place] to be a part of your care.

## 2020-11-02 NOTE — ED Provider Notes (Signed)
[facility]-EMERGENCY DEPT Provider Note   CSN: [id number] Arrival date & time: 11/02/20  4098     History Chief Complaint  Patient presents with   Flank Pain    Danielle Holden is a 50 y.o. female.  HPI 50 year old female with a history of abnormal uterine bleeding, asthma, anxiety, depression, gallstones, kidney stones, IBS, RA, osteoporosis, lupus presents to the ER with complaints of right back pain radiating into the abdomen which started 3 days ago, and burning with urination which started today.  Patient reports a history of kidney stones but this is very remote approximately 9 years ago.  Denies any vaginal bleeding or discharge.  She is not sexually active, last sexual encounter was 4 years ago.  She denies any nausea or vomiting.  No numbness or tingling her lower extremities, no foot drop, no loss of bowel bladder control.  No recent injuries or falls.  Denies any chest pain or shortness of breath.  She has been taking Tylenol for pain.  She reports a prior history of cholecystectomy and appendectomy, but still has her uterus and ovaries.    Past Medical History:  Diagnosis Date   Abnormal uterine bleeding    Anxiety    Arthritis    hands, knees, shoulder, back    Asthma    Colitis    History of   Complication of anesthesia 01/1992   During tubal ligation heart stopped and she stopped breathing   Depression    Environmental and seasonal allergies    Fatigue    Gallstones    GERD (gastroesophageal reflux disease)    Heart rate slow    per watch   History of kidney stones    History of shingles    Hypoglycemia    IBS (irritable bowel syndrome)    Iron deficiency anemia    receive iron infusion   Lactose intolerance    Lupus (HCC)    Migraine    Numbness and tingling of both upper extremities    first thing in the morning when she wakes up   Numbness and tingling of lower extremity    first thing in the morning when she wakes up    Osteoporosis    per patient, DEXA in 07/2019   RA (rheumatoid arthritis) (HCC)    Ventral hernia    Vertigo     Patient Active Problem List   Diagnosis Date Noted   History of kidney stones 05/29/2018   History of asthma 05/29/2018   Myofascial pain 05/29/2018   History of IBS 05/29/2018   History of gastroesophageal reflux (GERD) 05/29/2018   History of TIA (transient ischemic attack) 05/29/2018   Primary dysmenorrhea 06/13/2017   Generalized anxiety disorder 07/26/2014   Migraine without aura and without status migrainosus, not intractable 07/26/2014   Hx of migraines 01/27/2014   Migraine, unspecified, without mention of intractable migraine without mention of status migrainosus 09/28/2013   Mastodynia, female 07/13/2013   Abnormal uterine bleeding (AUB) 07/13/2013   Iron deficiency anemia 06/30/2013   TIA (transient ischemic attack) 06/27/2013   Acute right-sided weakness 06/27/2013   Anemia 06/27/2013   Menorrhagia 06/27/2013   Chest pain 06/27/2013    Past Surgical History:  Procedure Laterality Date   APPENDECTOMY     CESAREAN SECTION     CHOLECYSTECTOMY [name] 08/07/2017   Procedure: LAPAROSCOPIC CHOLECYSTECTOMY, PRIMARY REPAIR OF UMBILICAL HERNIA;  Surgeon: Andria Meuse, MD;  Location: [facility];  Service: General;  Laterality: [name];  COLONOSCOPY     IUD REMOVAL [name] 11/12/2014   TUBAL LIGATION     UMBILICAL HERNIA REPAIR       OB History     Gravida  4   Para  3   Term  3   Preterm      AB  1   Living  3      SAB  1   IAB      Ectopic      Multiple      Live Births  3           Family History  Problem Relation Age of Onset   Suicidality Father    Diabetes Maternal Grandmother    Breast cancer Maternal Grandmother    Cervical cancer Paternal Grandmother    Heart defect Son    Healthy Son    Healthy Daughter     Social History   Tobacco Use   Smoking status: Never   Smokeless tobacco: Never  Vaping Use   Vaping Use:  Never used  Substance Use Topics   Alcohol use: No    Alcohol/week: 0.0 standard drinks   Drug use: No    Comment: Quit in 1991    Home Medications Prior to Admission medications   Medication Sig Start Date End Date Taking? Authorizing Provider  methocarbamol (ROBAXIN) 500 MG tablet Take 1 tablet (500 mg total) by mouth 2 (two) times daily. 11/02/20  Yes Mare Ferrari, PA-C  acetaminophen (TYLENOL) 325 MG tablet Take 650 mg by mouth every 6 (six) hours as needed.    [provider]  albuterol (VENTOLIN HFA) 108 (90 Base) MCG/ACT inhaler INHALE 1 PUFF INTO THE LUNGS AS NEEDED FOR WHEEZING OR SHORTNESS OF BREATH. 08/09/20   Brock Bad, MD  alendronate (FOSAMAX) 70 MG tablet Take 1 tablet (70 mg total) by mouth once a week. Take with a full glass of water on an empty stomach. Patient not taking: Reported on 09/14/2020 05/12/20   Gearldine Bienenstock, PA-C  Atogepant (QULIPTA) 60 MG TABS Take 60 mg by mouth daily. 09/14/20   Drema Dallas, DO  Doxylamine Succinate, Sleep, (SLEEP AID PO) Take by mouth at bedtime. Patient not taking: Reported on 09/14/2020    [provider]  EPINEPHRINE 0.3 mg/0.3 mL IJ SOAJ injection INJECT 0.3 MLS (0.3 MG TOTAL) INTO THE MUSCLE AS NEEDED FOR ANAPHYLAXIS. 09/24/19   Brock Bad, MD  fluticasone (FLONASE) 50 MCG/ACT nasal spray PLACE 1 SPRAY INTO BOTH NOSTRILS DAILY. 08/19/20   Brock Bad, MD  ibuprofen (ADVIL) 800 MG tablet Take 1 tablet (800 mg total) by mouth every 8 (eight) hours as needed. Take after a meal. 07/29/19   Brock Bad, MD  loratadine (CLARITIN) 10 MG tablet TAKE 1 TABLET BY MOUTH EVERY DAY 08/09/20   Brock Bad, MD  Melatonin 500 MCG TBDP Take by mouth.    [provider]  Multiple Vitamin (MULTIVITAMIN PO) Take by mouth daily.    [provider]  ondansetron (ZOFRAN ODT) 4 MG disintegrating tablet Take 1 tablet (4 mg total) by mouth every 8 (eight) hours as needed for nausea or vomiting.  09/14/20   Drema Dallas, DO  Prenatal Vit-Fe Phos-FA-Omega (VITAFOL GUMMIES) 3.33-0.333-34.8 MG CHEW CHEW 3 TABLETS BY MOUTH DAILY BEFORE BREAKFAST. 08/23/20   Brock Bad, MD  Ubrogepant (UBRELVY) 100 MG TABS Take 1 tablet by mouth as needed (May repeat in 2 hours.  Maximum 2 tablets  in 24 hours). 09/14/20   Drema Dallas, DO  Vitamin D, Ergocalciferol, (DRISDOL) 1.25 MG (50000 UNIT) CAPS capsule TAKE 1 CAPSULE BY MOUTH EVERY 7 DAYS FOR 6 WEEKS. 09/22/20   Brock Bad, MD    Allergies    Anesthetics, amide; Benzocaine; Black pepper [piper]; Mushroom extract complex; Trazodone and nefazodone; Amoxicillin; Bee venom; Benadryl [diphenhydramine]; Effexor [venlafaxine]; Lactose intolerance (gi); Nortriptyline; Nsaids; and Other  Review of Systems   Review of Systems  Constitutional:  Negative for chills and fever.  HENT:  Negative for ear pain and sore throat.   Eyes:  Negative for pain and visual disturbance.  Respiratory:  Negative for cough and shortness of breath.   Cardiovascular:  Negative for chest pain and palpitations.  Gastrointestinal:  Positive for abdominal pain. Negative for vomiting.  Genitourinary:  Positive for dysuria. Negative for hematuria.  Musculoskeletal:  Positive for back pain. Negative for arthralgias.  Skin:  Negative for color change and rash.  Neurological:  Negative for seizures and syncope.  All other systems reviewed and are negative.  Physical Exam Updated Vital Signs BP 119/76 (BP Location: Right Arm)   Pulse 62   Temp 98.2 F (36.8 C) (Oral)   Resp 17   LMP 07/20/2018 (Exact Date)   SpO2 99%   Physical Exam Vitals reviewed. Exam conducted with a chaperone present.  Constitutional:      Appearance: Normal appearance.  HENT:     Head: Normocephalic and atraumatic.  Eyes:     General:        Right eye: No discharge.        Left eye: No discharge.     Extraocular Movements: Extraocular movements intact.     Conjunctiva/sclera: Conjunctivae  normal.  Cardiovascular:     Pulses: Normal pulses.     Heart sounds: Normal heart sounds.  Pulmonary:     Effort: Pulmonary effort is normal.     Breath sounds: Normal breath sounds.  Abdominal:     General: Abdomen is flat.     Tenderness: There is abdominal tenderness. There is right CVA tenderness.    Genitourinary:    Vagina: Normal.     Cervix: No cervical motion tenderness, friability or erythema.     Uterus: Normal.      Adnexa:        Right: Tenderness present.        Left: No tenderness.    Musculoskeletal:        General: No swelling. Normal range of motion.  Skin:    General: Skin is warm and dry.  Neurological:     General: No focal deficit present.     Mental Status: She is alert and oriented to person, place, and time.     Sensory: No sensory deficit.     Motor: No weakness.  Psychiatric:        Mood and Affect: Mood normal.        Behavior: Behavior normal.    ED Results / Procedures / Treatments   Labs (all labs ordered are listed, but only abnormal results are displayed) Labs Reviewed  WET PREP, GENITAL - Abnormal; Notable for the following components:      Result Value   WBC, Wet Prep HPF POC MODERATE (*)    All other components within normal limits  URINALYSIS, ROUTINE W REFLEX MICROSCOPIC - Abnormal; Notable for the following components:   Color, Urine STRAW (*)    All other components within normal limits  CBC  WITH DIFFERENTIAL/PLATELET  COMPREHENSIVE METABOLIC PANEL  GC/CHLAMYDIA PROBE AMP ([place]) NOT AT [facility]    EKG None  Radiology CT L-SPINE NO CHARGE  Result Date: 11/02/2020 CLINICAL DATA:  Right flank pain with burning with urination for 3 days. History of kidney stones. EXAM: CT LUMBAR SPINE WITHOUT CONTRAST TECHNIQUE: Multiplanar CT image reconstructions of the lumbar spine were generated from the abdominopelvic CT performed concurrently. Those findings are dictated separately. COMPARISON:  Abdominopelvic CT 04/16/2018.  FINDINGS: Segmentation: There are 5 lumbar type vertebral bodies. Alignment: Normal. Vertebrae: No evidence of acute fracture or traumatic subluxation. No pars defect. Mild sacroiliac degenerative changes bilaterally. Paraspinal and other soft tissues: Unremarkable. Disc levels: Mild disc bulging and vacuum phenomenon at L5-S1. No evidence of disc herniation, spinal stenosis or nerve root encroachment. IMPRESSION: 1. No acute lumbar spine findings or explanation for the patient's symptoms. 2. Mild disc degeneration and bulging at L5-S1. Electronically Signed   By: Carey Bullocks M.D.   On: 11/02/2020 12:57   CT Renal Stone Study  Result Date: 11/02/2020 CLINICAL DATA:  Right flank pain with burning with urination for 3 days. History of kidney stones. EXAM: CT ABDOMEN AND PELVIS WITHOUT CONTRAST TECHNIQUE: Multidetector CT imaging of the abdomen and pelvis was performed following the standard protocol without IV contrast. COMPARISON:  Abdominopelvic CT 04/16/2018. FINDINGS: Lower chest: Minimal atelectasis at both lung bases. No significant pleural or pericardial effusion. Stable small hiatal hernia. Hepatobiliary: The liver appears unremarkable as imaged in the noncontrast state. Mild extrahepatic biliary dilatation is similar to the prior study and within physiologic limits post cholecystectomy. Pancreas: Unremarkable. No pancreatic ductal dilatation or surrounding inflammatory changes. Spleen: Normal in size without focal abnormality. Adrenals/Urinary Tract: Both adrenal glands appear normal. Both kidneys appear unremarkable as imaged in the noncontrast state. No evidence of urinary tract calculus, hydronephrosis or perinephric soft tissue stranding. The bladder appears normal. Stomach/Bowel: No enteric contrast administered. The stomach appears unremarkable for its degree of distension. No evidence of bowel wall thickening, distention or surrounding inflammatory change. Vascular/Lymphatic: There are no  enlarged abdominal or pelvic lymph nodes. No significant vascular findings on noncontrast imaging. Reproductive: The uterus and ovaries appear unremarkable. No adnexal mass. Other: Stable small umbilical and right supraumbilical hernias containing only fat. No ascites. Musculoskeletal: No acute or significant osseous findings. Mild sacroiliac degenerative changes bilaterally. Lumbar spine findings dictated separately. IMPRESSION: 1. No evidence of urinary tract calculus, hydronephrosis or perinephric soft tissue stranding. 2. No acute findings or explanation for the patient's symptoms. 3. Previous cholecystectomy and appendectomy. 4. Stable small umbilical and right supraumbilical hernias containing only fat Electronically Signed   By: Carey Bullocks M.D.   On: 11/02/2020 12:53   US PELVIC COMPLETE WITH TRANSVAGINAL  Result Date: 11/02/2020 CLINICAL DATA:  50 year old female with right-sided pelvic pain. EXAM: TRANSABDOMINAL AND TRANSVAGINAL ULTRASOUND OF PELVIS TECHNIQUE: Both transabdominal and transvaginal ultrasound examinations of the pelvis were performed. Transabdominal technique was performed for global imaging of the pelvis including uterus, ovaries, adnexal regions, and pelvic cul-de-sac. It was necessary to proceed with endovaginal exam following the transabdominal exam to visualize the endometrium and ovaries. COMPARISON:  Pelvic ultrasound dated 03/03/2019. FINDINGS: Uterus Measurements: 6.1 x 3.1 x 3.6 cm = volume: 60 mL. The uterus is anteverted and retroflexed. The uterus demonstrates a heterogeneous echotexture. No focal mass. Endometrium Thickness: 4 mm.  No focal abnormality visualized. Right ovary Measurements: 2.6 x 0.9 x 1.7 cm = volume: 1.6 mL. Normal appearance/no adnexal mass. Left ovary Measurements: 2.0  x 1.2 x 1.4 cm = volume: 2.1 mL. Normal appearance/no adnexal mass. Other findings Small free fluid within the cul-de-sac. IMPRESSION: Heterogeneous uterus, otherwise unremarkable  pelvic ultrasound. Electronically Signed   By: Elgie Collard M.D.   On: 11/02/2020 15:31    Procedures Procedures   Medications Ordered in ED Medications  fentaNYL (SUBLIMAZE) injection 50 mcg (50 mcg Intravenous Given 11/02/20 1205)  ondansetron (ZOFRAN-ODT) disintegrating tablet 4 mg (4 mg Oral Given 11/02/20 1254)  acetaminophen (TYLENOL) tablet 650 mg (650 mg Oral Given 11/02/20 1328)    ED Course  I have reviewed the triage vital signs and the nursing notes.  Pertinent labs & imaging results that were available during my care of the patient were reviewed by me and considered in my medical decision making (see chart for details).    MDM Rules/Calculators/A&P                          50 year old female who presents to the ER with back pain and abdominal pain.  On arrival, she is well-appearing, no acute distress, resting comfortably in the ER bed.  Vitals overall reassuring.  Physical exam with some right lower quadrant/pelvic tenderness, as well as CVA tenderness.  DDx includes IBS, renal stones, pyelonephritis, ovarian torsion, TOA, ovarian tumor, STD, SBO, PID, BV/yeast infection  Labs and imaging ordered, reviewed and interpreted by me.  CBC and CMP unremarkable, UA with straw-colored urine but no other significant normalities noted.   CT of the abdomen pelvis with L-spine without any significant abnormalities, small umbilical and right supraumbilical hernias containing only fat noted.  Pelvic ultrasound with transvaginal view ordered.  Patient received fentanyl for pain, complaining of nausea, given Tylenol.  After questioning, patient states that she will have to drive that she has no one to pick her up.  Will give Tylenol for pain as she states that she cannot take NSAIDs as it makes her IBS worse.   Wet prep with moderate WBCs, however no clue cells, no yeast.  Again low suspicion for STDs given the patient has not been sexually active in 4 years.  Pelvic ultrasound with  transvaginal unremarkable.  On reevaluation, patient notes slight improvement in pain but still persistent.  Low suspicion for dissection as she has no chest pain, shortness of breath, dizziness, and no risk factors for this.  Pelvic ultrasound without any masses, abscesses.  Patient was informed about the reassuring work-up and she overall is relieved.  Question possible lumbar strain, no signs of rashes digestive of shingles.  Encouraged PCP follow-up.  Will prescribe Robaxin, patient educated on the sedating side effects and was told to not drink or drive the medication.  Encouraged to continue to take Tylenol.  We discussed return precautions.  She was understanding and is agreeable.  Stable for discharge.   Final Clinical Impression(s) / ED Diagnoses Final diagnoses:  Back pain  Adnexal pain    Rx / DC Orders ED Discharge Orders          Ordered    methocarbamol (ROBAXIN) 500 MG tablet  2 times daily        11/02/20 1552             Leone Brand 11/02/20 1553    Wynetta Fines, MD 11/03/20 [phone]

## 2020-11-03 ENCOUNTER — Telehealth: Payer: Self-pay | Admitting: *Deleted

## 2020-11-03 ENCOUNTER — Other Ambulatory Visit: Payer: Self-pay | Admitting: Obstetrics

## 2020-11-03 DIAGNOSIS — E559 Vitamin D deficiency, unspecified: Secondary | ICD-10-CM

## 2020-11-03 LAB — GC/CHLAMYDIA PROBE AMP (~~LOC~~) NOT AT ARMC
Chlamydia: NEGATIVE
Comment: NEGATIVE
Comment: NORMAL
Neisseria Gonorrhea: NEGATIVE

## 2020-11-03 NOTE — Telephone Encounter (Signed)
Transition Care Management Follow-up Telephone Call Date of discharge and from where: 11/02/2020 - [facility] How have you been since you were released from the hospital? "Okay" Any questions or concerns? No  Items Reviewed: Did the pt receive and understand the discharge instructions provided? Yes  Medications obtained and verified? Yes  Other? No  Any new allergies since your discharge? No  Dietary orders reviewed? No Do you have support at home? Yes   Functional Questionnaire: (I = Independent and D = Dependent) ADLs: I  Bathing/Dressing- I  Meal Prep- I  Eating- I  Maintaining continence- I  Transferring/Ambulation- I  Managing Meds- I  Follow up appointments reviewed:  PCP Hospital f/u appt confirmed? No   Specialist Hospital f/u appt confirmed? No   Are transportation arrangements needed? No  If their condition worsens, is the pt aware to call PCP or go to the Emergency Dept.? Yes Was the patient provided with contact information for the PCP's office or ED? Yes Was to pt encouraged to call back with questions or concerns? Yes

## 2020-11-10 ENCOUNTER — Other Ambulatory Visit: Payer: Self-pay | Admitting: Physician Assistant

## 2020-11-10 ENCOUNTER — Ambulatory Visit (INDEPENDENT_AMBULATORY_CARE_PROVIDER_SITE_OTHER): Payer: Medicaid Other | Admitting: Physician Assistant

## 2020-11-10 ENCOUNTER — Encounter: Payer: Self-pay | Admitting: Physician Assistant

## 2020-11-10 ENCOUNTER — Other Ambulatory Visit: Payer: Self-pay

## 2020-11-10 VITALS — BP 121/77 | HR 67 | Resp 14 | Ht 63.5 in | Wt 154.8 lb

## 2020-11-10 DIAGNOSIS — Z8669 Personal history of other diseases of the nervous system and sense organs: Secondary | ICD-10-CM | POA: Diagnosis not present

## 2020-11-10 DIAGNOSIS — M7061 Trochanteric bursitis, right hip: Secondary | ICD-10-CM | POA: Diagnosis not present

## 2020-11-10 DIAGNOSIS — F419 Anxiety disorder, unspecified: Secondary | ICD-10-CM

## 2020-11-10 DIAGNOSIS — E559 Vitamin D deficiency, unspecified: Secondary | ICD-10-CM

## 2020-11-10 DIAGNOSIS — M5136 Other intervertebral disc degeneration, lumbar region: Secondary | ICD-10-CM

## 2020-11-10 DIAGNOSIS — R768 Other specified abnormal immunological findings in serum: Secondary | ICD-10-CM

## 2020-11-10 DIAGNOSIS — M810 Age-related osteoporosis without current pathological fracture: Secondary | ICD-10-CM | POA: Diagnosis not present

## 2020-11-10 DIAGNOSIS — R5383 Other fatigue: Secondary | ICD-10-CM | POA: Diagnosis not present

## 2020-11-10 DIAGNOSIS — M7918 Myalgia, other site: Secondary | ICD-10-CM

## 2020-11-10 DIAGNOSIS — Z8709 Personal history of other diseases of the respiratory system: Secondary | ICD-10-CM

## 2020-11-10 DIAGNOSIS — Z8719 Personal history of other diseases of the digestive system: Secondary | ICD-10-CM | POA: Diagnosis not present

## 2020-11-10 DIAGNOSIS — M25561 Pain in right knee: Secondary | ICD-10-CM

## 2020-11-10 DIAGNOSIS — R42 Dizziness and giddiness: Secondary | ICD-10-CM

## 2020-11-10 DIAGNOSIS — G8929 Other chronic pain: Secondary | ICD-10-CM

## 2020-11-10 DIAGNOSIS — Z87442 Personal history of urinary calculi: Secondary | ICD-10-CM

## 2020-11-10 DIAGNOSIS — M7062 Trochanteric bursitis, left hip: Secondary | ICD-10-CM

## 2020-11-10 DIAGNOSIS — F32A Depression, unspecified: Secondary | ICD-10-CM

## 2020-11-10 DIAGNOSIS — Z8673 Personal history of transient ischemic attack (TIA), and cerebral infarction without residual deficits: Secondary | ICD-10-CM | POA: Diagnosis not present

## 2020-11-10 MED ORDER — ALENDRONATE SODIUM 70 MG PO TABS: 70.0000 mg | ORAL_TABLET | ORAL | 0 refills | Status: DC

## 2020-11-10 NOTE — Telephone Encounter (Signed)
Fosamax refill and physical therapy referral pended.   Please review and sign. Thanks!

## 2020-11-10 NOTE — Patient Instructions (Signed)
Journal for Nurse Practitioners, 15(4), 263-267. Retrieved February 17, 2018 from [email or web address]">  Knee Exercises Ask your health care provider which exercises are safe for you. Do exercises exactly as told by your health care provider and adjust them as directed. It is normal to feel mild stretching, pulling, tightness, or discomfort as you do these exercises. Stop right away if you feel sudden pain or your pain gets worse. Do not begin these exercises until told by your health care provider. Stretching and range-of-motion exercises These exercises warm up your muscles and joints and improve the movement and flexibility of your knee. These exercises also help to relieve pain andswelling. Knee extension, prone Lie on your abdomen (prone position) on a bed. Place your left / right knee just beyond the edge of the surface so your knee is not on the bed. You can put a towel under your left / right thigh just above your kneecap for comfort. Relax your leg muscles and allow gravity to straighten your knee (extension). You should feel a stretch behind your left / right knee. Hold this position for __________ seconds. Scoot up so your knee is supported between repetitions. Repeat __________ times. Complete this exercise __________ times a day. Knee flexion, active  Lie on your back with both legs straight. If this causes back discomfort, bend your left / right knee so your foot is flat on the floor. Slowly slide your left / right heel back toward your buttocks. Stop when you feel a gentle stretch in the front of your knee or thigh (flexion). Hold this position for __________ seconds. Slowly slide your left / right heel back to the starting position. Repeat __________ times. Complete this exercise __________ times a day. Quadriceps stretch, prone  Lie on your abdomen on a firm surface, such as a bed or padded floor. Bend your left / right knee and hold your ankle. If you cannot reach  your ankle or pant leg, loop a belt around your foot and grab the belt instead. Gently pull your heel toward your buttocks. Your knee should not slide out to the side. You should feel a stretch in the front of your thigh and knee (quadriceps). Hold this position for __________ seconds. Repeat __________ times. Complete this exercise __________ times a day. Hamstring, supine Lie on your back (supine position). Loop a belt or towel over the ball of your left / right foot. The ball of your foot is on the walking surface, right under your toes. Straighten your left / right knee and slowly pull on the belt to raise your leg until you feel a gentle stretch behind your knee (hamstring). Do not let your knee bend while you do this. Keep your other leg flat on the floor. Hold this position for __________ seconds. Repeat __________ times. Complete this exercise __________ times a day. Strengthening exercises These exercises build strength and endurance in your knee. Endurance is theability to use your muscles for a long time, even after they get tired. Quadriceps, isometric This exercise stretches the muscles in front of your thigh (quadriceps) without moving your knee joint (isometric). Lie on your back with your left / right leg extended and your other knee bent. Put a rolled towel or small pillow under your knee if told by your health care provider. Slowly tense the muscles in the front of your left / right thigh. You should see your kneecap slide up toward your hip or see increased dimpling just above the knee. This motion will   push the back of the knee toward the floor. For __________ seconds, hold the muscle as tight as you can without increasing your pain. Relax the muscles slowly and completely. Repeat __________ times. Complete this exercise __________ times a day. Straight leg raises This exercise stretches the muscles in front of your thigh (quadriceps) and the muscles that move your hips (hip  flexors). Lie on your back with your left / right leg extended and your other knee bent. Tense the muscles in the front of your left / right thigh. You should see your kneecap slide up or see increased dimpling just above the knee. Your thigh may even shake a bit. Keep these muscles tight as you raise your leg 4-6 inches (10-15 cm) off the floor. Do not let your knee bend. Hold this position for __________ seconds. Keep these muscles tense as you lower your leg. Relax your muscles slowly and completely after each repetition. Repeat __________ times. Complete this exercise __________ times a day. Hamstring, isometric Lie on your back on a firm surface. Bend your left / right knee about __________ degrees. Dig your left / right heel into the surface as if you are trying to pull it toward your buttocks. Tighten the muscles in the back of your thighs (hamstring) to "dig" as hard as you can without increasing any pain. Hold this position for __________ seconds. Release the tension gradually and allow your muscles to relax completely for __________ seconds after each repetition. Repeat __________ times. Complete this exercise __________ times a day. Hamstring curls If told by your health care provider, do this exercise while wearing ankle weights. Begin with __________ lb weights. Then increase the weight by 1 lb (0.5 kg) increments. Do not wear ankle weights that are more than __________ lb. Lie on your abdomen with your legs straight. Bend your left / right knee as far as you can without feeling pain. Keep your hips flat against the floor. Hold this position for __________ seconds. Slowly lower your leg to the starting position. Repeat __________ times. Complete this exercise __________ times a day. Squats This exercise strengthens the muscles in front of your thigh and knee (quadriceps). Stand in front of a table, with your feet and knees pointing straight ahead. You may rest your hands on the  table for balance but not for support. Slowly bend your knees and lower your hips like you are going to sit in a chair. Keep your weight over your heels, not over your toes. Keep your lower legs upright so they are parallel with the table legs. Do not let your hips go lower than your knees. Do not bend lower than told by your health care provider. If your knee pain increases, do not bend as low. Hold the squat position for __________ seconds. Slowly push with your legs to return to standing. Do not use your hands to pull yourself to standing. Repeat __________ times. Complete this exercise __________ times a day. Wall slides This exercise strengthens the muscles in front of your thigh and knee (quadriceps). Lean your back against a smooth wall or door, and walk your feet out 18-24 inches (46-61 cm) from it. Place your feet hip-width apart. Slowly slide down the wall or door until your knees bend __________ degrees. Keep your knees over your heels, not over your toes. Keep your knees in line with your hips. Hold this position for __________ seconds. Repeat __________ times. Complete this exercise __________ times a day. Straight leg raises This exercise   strengthens the muscles that rotate the leg at the hip and move it away from your body (hip abductors). Lie on your side with your left / right leg in the top position. Lie so your head, shoulder, knee, and hip line up. You may bend your bottom knee to help you keep your balance. Roll your hips slightly forward so your hips are stacked directly over each other and your left / right knee is facing forward. Leading with your heel, lift your top leg 4-6 inches (10-15 cm). You should feel the muscles in your outer hip lifting. Do not let your foot drift forward. Do not let your knee roll toward the ceiling. Hold this position for __________ seconds. Slowly return your leg to the starting position. Let your muscles relax completely after each  repetition. Repeat __________ times. Complete this exercise __________ times a day. Straight leg raises This exercise stretches the muscles that move your hips away from the front of the pelvis (hip extensors). Lie on your abdomen on a firm surface. You can put a pillow under your hips if that is more comfortable. Tense the muscles in your buttocks and lift your left / right leg about 4-6 inches (10-15 cm). Keep your knee straight as you lift your leg. Hold this position for __________ seconds. Slowly lower your leg to the starting position. Let your leg relax completely after each repetition. Repeat __________ times. Complete this exercise __________ times a day. This information is not intended to replace advice given to you by your health care provider. Make sure you discuss any questions you have with your healthcare provider. Document Revised: 02/18/2018 Document Reviewed: 02/18/2018 [employer] Patient Education  2022 [employer] Hip Bursitis Rehab Ask your health care provider which exercises are safe for you. Do exercises exactly as told by your health care provider and adjust them as directed. It is normal to feel mild stretching, pulling, tightness, or discomfort as you do these exercises. Stop right away if you feel sudden pain or your pain gets worse. Do not begin these exercises until told by your health care provider. Stretching exercise This exercise warms up your muscles and joints and improves the movement andflexibility of your hip. This exercise also helps to relieve pain and stiffness. Iliotibial band stretch An iliotibial band is a strong band of muscle tissue that runs from the outer side of your hip to the outer side of your thigh and knee. Lie on your side with your left / right leg in the top position. Bend your left / right knee and grab your ankle. Stretch out your bottom arm to help you balance. Slowly bring your knee back so your thigh is behind your body. Slowly  lower your knee toward the floor until you feel a gentle stretch on the outside of your left / right thigh. If you do not feel a stretch and your knee will not fall farther, place the heel of your other foot on top of your knee and pull your knee down toward the floor with your foot. Hold this position for __________ seconds. Slowly return to the starting position. Repeat __________ times. Complete this exercise __________ times a day. Strengthening exercises These exercises build strength and endurance in your hip and pelvis. Enduranceis the ability to use your muscles for a long time, even after they get tired. Bridge This exercise strengthens the muscles that move your thigh backward (hip extensors). Lie on your back on a firm surface with your knees bent  and your feet flat on the floor. Tighten your buttocks muscles and lift your buttocks off the floor until your trunk is level with your thighs. Do not arch your back. You should feel the muscles working in your buttocks and the back of your thighs. If you do not feel these muscles, slide your feet 1-2 inches (2.5-5 cm) farther away from your buttocks. If this exercise is too easy, try doing it with your arms crossed over your chest. Hold this position for __________ seconds. Slowly lower your hips to the starting position. Let your muscles relax completely after each repetition. Repeat __________ times. Complete this exercise __________ times a day. Squats This exercise strengthens the muscles in front of your thigh and knee (quadriceps). Stand in front of a table, with your feet and knees pointing straight ahead. You may rest your hands on the table for balance but not for support. Slowly bend your knees and lower your hips like you are going to sit in a chair. Keep your weight over your heels, not over your toes. Keep your lower legs upright so they are parallel with the table legs. Do not let your hips go lower than your knees. Do not  bend lower than told by your health care provider. If your hip pain increases, do not bend as low. Hold the squat position for __________ seconds. Slowly push with your legs to return to standing. Do not use your hands to pull yourself to standing. Repeat __________ times. Complete this exercise __________ times a day. Hip hike Stand sideways on a bottom step. Stand on your left / right leg with your other foot unsupported next to the step. You can hold on to the railing or wall for balance if needed. Keep your knees straight and your torso square. Then lift your left / right hip up toward the ceiling. Hold this position for __________ seconds. Slowly let your left / right hip lower toward the floor, past the starting position. Your foot should get closer to the floor. Do not lean or bend your knees. Repeat __________ times. Complete this exercise __________ times a day. Single leg stand Without shoes, stand near a railing or in a doorway. You may hold on to the railing or door frame as needed for balance. Squeeze your left / right buttock muscles, then lift up your other foot. Do not let your left / right hip push out to the side. It is helpful to stand in front of a mirror for this exercise so you can watch your hip. Hold this position for __________ seconds. Repeat __________ times. Complete this exercise __________ times a day. This information is not intended to replace advice given to you by your health care provider. Make sure you discuss any questions you have with your healthcare provider. Document Revised: 08/25/2018 Document Reviewed: 08/25/2018 [employer] Patient Education  2022 [employer].

## 2020-11-11 LAB — VITAMIN D 25 HYDROXY (VIT D DEFICIENCY, FRACTURES): Vit D, 25-Hydroxy: 79 ng/mL (ref 30–100)

## 2020-11-11 NOTE — Progress Notes (Signed)
Vitamin D is WNL.

## 2020-11-22 ENCOUNTER — Encounter: Payer: Self-pay | Admitting: Radiology

## 2020-11-25 NOTE — Progress Notes (Signed)
Office Visit Note  Patient: Danielle Holden             Date of Birth: 04/10/1971           MRN: [id number]             PCP: Patient, No Pcp Per (Inactive) Referring: No ref. provider found Visit Date: 12/08/2020 Occupation: @  Subjective:  Joint pain and fatigue   History of Present Illness: Danielle Holden is a 50 y.o. female with a history of systemic lupus, degenerative disc disease and osteoarthritis.  She states she continues to have frequent oral ulcers, photosensitivity, rash and joint pain.  She states she has been experiencing increased joint pain recently.  Denies any joint swelling.  She has discomfort when doing routine activities.  She has been off Plaquenil for 10 years.  Activities of Daily Living:  Patient reports morning stiffness for 6-7 hours.   Patient Reports nocturnal pain.  Difficulty dressing/grooming: Reports Difficulty climbing stairs: Reports Difficulty getting out of chair: Reports Difficulty using hands for taps, buttons, cutlery, and/or writing: Denies  Review of Systems  Constitutional:  Positive for fatigue.  HENT:  Positive for mouth dryness and nose dryness. Negative for mouth sores.   Eyes:  Positive for itching and dryness. Negative for pain.  Respiratory:  Negative for shortness of breath and difficulty breathing.   Cardiovascular:  Negative for chest pain and palpitations.  Gastrointestinal:  Negative for blood in stool, constipation and diarrhea.  Endocrine: Positive for increased urination.  Genitourinary:  Negative for difficulty urinating.  Musculoskeletal:  Positive for joint pain, joint pain, joint swelling, myalgias, morning stiffness, muscle tenderness and myalgias.  Skin:  Positive for rash and sensitivity to sunlight. Negative for redness.  Allergic/Immunologic: Negative for susceptible to infections.  Neurological:  Positive for light-headedness, numbness, headaches and weakness. Negative for memory loss.   Hematological:  Positive for bruising/bleeding tendency. Negative for swollen glands.  Psychiatric/Behavioral:  Positive for depressed mood and sleep disturbance. Negative for confusion. The patient is nervous/anxious.    PMFS History:  Patient Active Problem List   Diagnosis Date Noted   History of kidney stones 05/29/2018   History of asthma 05/29/2018   Myofascial pain 05/29/2018   History of IBS 05/29/2018   History of gastroesophageal reflux (GERD) 05/29/2018   History of TIA (transient ischemic attack) 05/29/2018   Primary dysmenorrhea 06/13/2017   Generalized anxiety disorder 07/26/2014   Migraine without aura and without status migrainosus, not intractable 07/26/2014   Hx of migraines 01/27/2014   Migraine, unspecified, without mention of intractable migraine without mention of status migrainosus 09/28/2013   Mastodynia, female 07/13/2013   Abnormal uterine bleeding (AUB) 07/13/2013   Iron deficiency anemia 06/30/2013   TIA (transient ischemic attack) 06/27/2013   Acute right-sided weakness 06/27/2013   Anemia 06/27/2013   Menorrhagia 06/27/2013   Chest pain 06/27/2013    Past Medical History:  Diagnosis Date   Abnormal uterine bleeding    Anxiety    Arthritis    hands, knees, shoulder, back    Asthma    Colitis    History of   Complication of anesthesia 01/1992   During tubal ligation heart stopped and she stopped breathing   Depression    Environmental and seasonal allergies    Fatigue    Gallstones    GERD (gastroesophageal reflux disease)    Heart rate slow    per watch   History of kidney stones    History of  shingles    Hypoglycemia    IBS (irritable bowel syndrome)    Iron deficiency anemia    receive iron infusion   Lactose intolerance    Lumbar herniated disc    per patient   Lupus (HCC)    Migraine    Numbness and tingling of both upper extremities    first thing in the morning when she wakes up   Numbness and tingling of lower extremity     first thing in the morning when she wakes up   Osteoporosis    per patient, DEXA in 07/2019   RA (rheumatoid arthritis) (HCC)    Ventral hernia    Vertigo     Family History  Problem Relation Age of Onset   Suicidality Father    Diabetes Maternal Grandmother    Breast cancer Maternal Grandmother    Cervical cancer Paternal Grandmother    Heart defect Son    Healthy Son    Healthy Daughter    Past Surgical History:  Procedure Laterality Date   APPENDECTOMY     CESAREAN SECTION     CHOLECYSTECTOMY [name] 08/07/2017   Procedure: LAPAROSCOPIC CHOLECYSTECTOMY, PRIMARY REPAIR OF UMBILICAL HERNIA;  Surgeon: Andria Meuse, MD;  Location: [facility];  Service: General;  Laterality: [name];   COLONOSCOPY     IUD REMOVAL [name] 11/12/2014   TUBAL LIGATION     UMBILICAL HERNIA REPAIR     Social History   Social History Narrative   Patient lives at home alone and she is disabled.   Education 9th grade.   Right handed.   Caffeine one cup of coffee not daily.    Immunization History  Administered Date(s) Administered   Influenza,inj,Quad PF,6+ Mos 02/11/2019   Influenza-Unspecified 01/13/2020   PFIZER(Purple Top)SARS-COV-2 Vaccination 08/01/2019, 08/22/2019, 03/04/2020   Tetanus 01/13/2020     Objective: Vital Signs: BP 115/80 (BP Location: Left Arm, Patient Position: Sitting, Cuff Size: Normal)   Pulse 66   Ht 5' 3.5" (1.613 m)   Wt 157 lb (71.2 kg)   LMP 07/20/2018 (Exact Date)   BMI 27.38 kg/m    Physical Exam Vitals and nursing note reviewed.  Constitutional:      Appearance: She is well-developed.  HENT:     Head: Normocephalic and atraumatic.  Eyes:     Conjunctiva/sclera: Conjunctivae normal.  Cardiovascular:     Rate and Rhythm: Normal rate and regular rhythm.     Heart sounds: Normal heart sounds.  Pulmonary:     Effort: Pulmonary effort is normal.     Breath sounds: Normal breath sounds.  Abdominal:     General: Bowel sounds are normal.     Palpations:  Abdomen is soft.  Musculoskeletal:     Cervical back: Normal range of motion.  Lymphadenopathy:     Cervical: No cervical adenopathy.  Skin:    General: Skin is warm and dry.     Capillary Refill: Capillary refill takes less than 2 seconds.  Neurological:     Mental Status: She is alert and oriented to person, place, and time.  Psychiatric:        Behavior: Behavior normal.     Musculoskeletal Exam: C-spine was in good range of motion.  Shoulder joints, elbow joints, wrist joints, MCPs PIPs and DIPs with good range of motion with no synovitis.  Hip joints, knee joints, ankles, MTPs and PIPs with good range of motion with no synovitis.  CDAI Exam: CDAI Score: -- Patient Global: --; Provider Global: --  Swollen: --; Tender: -- Joint Exam 12/08/2020   No joint exam has been documented for this visit   There is currently no information documented on the homunculus. Go to the Rheumatology activity and complete the homunculus joint exam.  Investigation: No additional findings.  Imaging: No results found.  Recent Labs: Lab Results  Component Value Date   WBC 7.0 11/02/2020   HGB 14.0 11/02/2020   PLT 255 11/02/2020   NA 141 11/02/2020   K 3.9 11/02/2020   CL 107 11/02/2020   CO2 25 11/02/2020   GLUCOSE 84 11/02/2020   BUN 11 11/02/2020   CREATININE 0.68 11/02/2020   BILITOT 0.6 11/02/2020   ALKPHOS 77 11/02/2020   AST 22 11/02/2020   ALT 20 11/02/2020   PROT 7.4 11/02/2020   ALBUMIN 4.2 11/02/2020   CALCIUM 9.2 11/02/2020   GFRAA 114 04/11/2020   November 15, 2020 AVISE lupus index -2.3, ANA 1: 320NS, dsDNA positive, Smith negative, RNP negative, Ro negative, La negative, SCL 70 negative, RNA polymerase 3 negative, centromere negative, Jo 1 negative, CB CAP negative, anticardiolipin negative, beta-2 GP 1 negative, antiphosphatidylserine negative, antihistone negative, RF negative, anti-CCP negative, anti-CarP-, antithyroglobulin negative, anti-TPO negative  Speciality  Comments: No specialty comments available.  Procedures:  No procedures performed Allergies: Anesthetics, amide; Benzocaine; Black pepper [piper]; Mushroom extract complex; Trazodone and nefazodone; Amoxicillin; Bee venom; Benadryl [diphenhydramine]; Effexor [venlafaxine]; Lactose intolerance (gi); Nortriptyline; Nsaids; and Other   Assessment / Plan:     Visit Diagnoses: Other systemic lupus erythematosus with other organ involvement (HCC) - +ANA, +dsDNA,history of oral ulcers, arthralgias, joint swelling, photosensitivity, rash: She was treated with Plaquenil in [place].  Off Plaquenil for 10 yrs. she states her symptoms are coming back.  She has frequent oral ulcers, joint pain and joint inflammation.  She also gives history of photosensitivity and rash.  I detailed discussion with the patient regarding restarting on hydroxychloroquine.  Indications side effects contraindications were discussed at length.  Handout was given and consent was taken.  My plan is to start on hydroxychloroquine 200 mg p.o. twice daily Monday to Friday.  We will check labs in a month and then every 3 months to monitor for drug toxicity.  Patient was counseled on the purpose, proper use, and adverse effects of hydroxychloroquine including nausea/diarrhea, skin rash, headaches, and sun sensitivity.  Advised patient to wear sunscreen once starting hydroxychloroquine to reduce risk of rash associated with sun sensitivity.  Discussed importance of annual eye exams while on hydroxychloroquine to monitor to ocular toxicity and discussed importance of frequent laboratory monitoring.  Provided patient with eye exam form for baseline ophthalmologic exam.  Provided patient with educational materials on hydroxychloroquine and answered all questions.  Patient consented to hydroxychloroquine. Will upload consent in the media tab.    High risk medication use-labs in 1 month, 3 months and then every 5 months if stable.  Myofascial pain-she  continues to have generalized pain and discomfort and has positive tender points.  Other fatigue-she also experiences chronic fatigue which could be due to myofascial pain syndrome.  Trochanteric bursitis of both hips-IT band stretches were discussed.  DDD (degenerative disc disease), lumbar - She had a CT of the lumbar spine on 11/02/2020 which revealed mild disc degeneration and bulging at L5-S1.  Age-related osteoporosis without current pathological fracture - Fosamax  by mouth once weekly. DEXA from July 31, 2019 with T score of -2.6.   Dizziness - Evaluated by Dr. Everlena Cooper.   Anxiety and depression-she  had intolerance to multiple medications in the past.  History of TIA (transient ischemic attack)  History of gastroesophageal reflux (GERD)  History of kidney stones  History of asthma  Hx of migraines - Followed by Dr. Everlena Cooper.   Vitamin D deficiency  History of IBS  Orders: No orders of the defined types were placed in this encounter.  Meds ordered this encounter  Medications   hydroxychloroquine (PLAQUENIL) 200 MG tablet    Sig: Take 200mg  by mouth twice daily, Monday through Friday only. None on Saturday or Sunday.    Dispense:  120 tablet    Refill:  0     Follow-Up Instructions: Return in about 6 weeks (around 01/19/2021) for SLE.   03/21/2021, MD  Note - This record has been created using Pollyann Savoy.  Chart creation errors have been sought, but may not always  have been located. Such creation errors do not reflect on  the standard of medical care.

## 2020-12-08 ENCOUNTER — Ambulatory Visit: Payer: Medicaid Other | Admitting: Rheumatology

## 2020-12-08 ENCOUNTER — Other Ambulatory Visit: Payer: Self-pay

## 2020-12-08 ENCOUNTER — Encounter: Payer: Self-pay | Admitting: Rheumatology

## 2020-12-08 VITALS — BP 115/80 | HR 66 | Ht 63.5 in | Wt 157.0 lb

## 2020-12-08 DIAGNOSIS — Z87442 Personal history of urinary calculi: Secondary | ICD-10-CM | POA: Diagnosis not present

## 2020-12-08 DIAGNOSIS — Z8673 Personal history of transient ischemic attack (TIA), and cerebral infarction without residual deficits: Secondary | ICD-10-CM | POA: Diagnosis not present

## 2020-12-08 DIAGNOSIS — R42 Dizziness and giddiness: Secondary | ICD-10-CM

## 2020-12-08 DIAGNOSIS — Z8719 Personal history of other diseases of the digestive system: Secondary | ICD-10-CM | POA: Diagnosis not present

## 2020-12-08 DIAGNOSIS — Z79899 Other long term (current) drug therapy: Secondary | ICD-10-CM

## 2020-12-08 DIAGNOSIS — M7061 Trochanteric bursitis, right hip: Secondary | ICD-10-CM | POA: Diagnosis not present

## 2020-12-08 DIAGNOSIS — E559 Vitamin D deficiency, unspecified: Secondary | ICD-10-CM

## 2020-12-08 DIAGNOSIS — M810 Age-related osteoporosis without current pathological fracture: Secondary | ICD-10-CM | POA: Diagnosis not present

## 2020-12-08 DIAGNOSIS — M3219 Other organ or system involvement in systemic lupus erythematosus: Secondary | ICD-10-CM

## 2020-12-08 DIAGNOSIS — F419 Anxiety disorder, unspecified: Secondary | ICD-10-CM | POA: Diagnosis not present

## 2020-12-08 DIAGNOSIS — R5383 Other fatigue: Secondary | ICD-10-CM

## 2020-12-08 DIAGNOSIS — R768 Other specified abnormal immunological findings in serum: Secondary | ICD-10-CM

## 2020-12-08 DIAGNOSIS — M7918 Myalgia, other site: Secondary | ICD-10-CM | POA: Diagnosis not present

## 2020-12-08 DIAGNOSIS — M5136 Other intervertebral disc degeneration, lumbar region: Secondary | ICD-10-CM | POA: Diagnosis not present

## 2020-12-08 DIAGNOSIS — M7062 Trochanteric bursitis, left hip: Secondary | ICD-10-CM

## 2020-12-08 DIAGNOSIS — F32A Depression, unspecified: Secondary | ICD-10-CM

## 2020-12-08 DIAGNOSIS — Z8709 Personal history of other diseases of the respiratory system: Secondary | ICD-10-CM

## 2020-12-08 DIAGNOSIS — Z8669 Personal history of other diseases of the nervous system and sense organs: Secondary | ICD-10-CM

## 2020-12-08 MED ORDER — HYDROXYCHLOROQUINE SULFATE 200 MG PO TABS: ORAL_TABLET | ORAL | 0 refills | Status: DC

## 2020-12-08 NOTE — Patient Instructions (Signed)
Hydroxychloroquine tablets What is this medication? HYDROXYCHLOROQUINE (hye drox ee KLOR oh kwin) is used to treat rheumatoidarthritis and systemic lupus erythematosus. It is also used to treat malaria. This medicine may be used for other purposes; ask your health care provider orpharmacist if you have questions. COMMON BRAND NAME(S): Plaquenil, Quineprox What should I tell my care team before I take this medication? They need to know if you have any of these conditions: diabetes eye disease, vision problems G6PD deficiency heart disease history of irregular heartbeat if you often drink alcohol kidney disease liver disease porphyria psoriasis an unusual or allergic reaction to chloroquine, hydroxychloroquine, other medicines, foods, dyes, or preservatives pregnant or trying to get pregnant breast-feeding How should I use this medication? Take this medicine by mouth with a glass of water. Take it as directed on the prescription label. Do not cut, crush or chew this medicine. Swallow the tablets whole. Take it with food. Do not take it more than directed. Take all of this medicine unless your health care provider tells you to stop it early.Keep taking it even if you think you are better. Take products with antacids in them at a different time of day than this medicine. Take this medicine 4 hours before or 4 hours after antacids. Talk toyour health care provider if you have questions. Talk to your pediatrician regarding the use of this medicine in children. Whilethis drug may be prescribed for selected conditions, precautions do apply. Overdosage: If you think you have taken too much of this medicine contact apoison control center or emergency room at once. NOTE: This medicine is only for you. Do not share this medicine with others. What if I miss a dose? If you miss a dose, take it as soon as you can. If it is almost time for yournext dose, take only that dose. Do not take double or extra  doses. What may interact with this medication? Do not take this medicine with any of the following medications: cisapride dronedarone pimozide thioridazine This medicine may also interact with the following medications: ampicillin antacids cimetidine cyclosporine digoxin kaolin medicines for diabetes, like insulin, glipizide, glyburide medicines for seizures like carbamazepine, phenobarbital, phenytoin mefloquine methotrexate other medicines that prolong the QT interval (cause an abnormal heart rhythm) praziquantel This list may not describe all possible interactions. Give your health care provider a list of all the medicines, herbs, non-prescription drugs, or dietary supplements you use. Also tell them if you smoke, drink alcohol, or use illegaldrugs. Some items may interact with your medicine. What should I watch for while using this medication? Visit your health care provider for regular checks on your progress. Tell your health care provider if your symptoms do not start to get better or if they getworse. You may need blood work done while you are taking this medicine. If you take other medicines that can affect heart rhythm, you may need more testing. Talkto your health care provider if you have questions. Your vision may be tested before and during use of this medicine. Tell yourhealth care provider right away if you have any change in your eyesight. This medicine may cause serious skin reactions. They can happen weeks to months after starting the medicine. Contact your health care provider right away if you notice fevers or flu-like symptoms with a rash. The rash may be red or purple and then turn into blisters or peeling of the skin. Or, you might notice a red rash with swelling of the face, lips or lymph nodes   in your neck or underyour arms. If you or your family notice any changes in your behavior, such as new or worsening depression, thoughts of harming yourself, anxiety, or  other unusual or disturbing thoughts, or memory loss, call your health care provider rightaway. What side effects may I notice from receiving this medication? Side effects that you should report to your doctor or health care professionalas soon as possible: allergic reactions (skin rash, itching or hives; swelling of the face, lips, or tongue) changes in vision decreased hearing, ringing in the ears heartbeat rhythm changes (trouble breathing; chest pain; dizziness; fast, irregular heartbeat; feeling faint or lightheaded, falls) liver injury (dark yellow or brown urine; general ill feeling or flu-like symptoms; loss of appetite, right upper belly pain; unusually weak or tired, yellowing of the eyes or skin) low blood sugar (feeling anxious; confusion; dizziness; increased hunger; unusually weak or tired; increased sweating; shakiness; cold, clammy skin; irritable; headache; blurred vision; fast heartbeat; loss of consciousness) low red blood cell counts (trouble breathing; feeling faint; lightheaded, falls; unusually weak or tired) muscle weakness pain, tingling, numbness in the hands or feet rash, fever, and swollen lymph nodes redness, blistering, peeling or loosening of the skin, including inside the mouth suicidal thoughts, mood changes uncontrollable head, mouth, neck, arm, or leg movements unusual bruising or bleeding Side effects that usually do not require medical attention (report to yourdoctor or health care professional if they continue or are bothersome): diarrhea hair loss irritable This list may not describe all possible side effects. Call your doctor for medical advice about side effects. You may report side effects to FDA at1-800-FDA-1088. Where should I keep my medication? Keep out of the reach of children and pets. Store at room temperature up to 30 degrees C (86 degrees F). Protect fromlight. Get rid of any unused medicine after the expiration date. To get rid of medicines  that are no longer needed or have expired: Take the medicine to a medicine take-back program. Check with your pharmacy or law enforcement to find a location. If you cannot return the medicine, check the label or package insert to see if the medicine should be thrown out in the garbage or flushed down the toilet. If you are not sure, ask your health care provider. If it is safe to put it in the trash, empty the medicine out of the container. Mix the medicine with cat litter, dirt, coffee grounds, or other unwanted substance. Seal the mixture in a bag or container. Put it in the trash. NOTE: This sheet is a summary. It may not cover all possible information. If you have questions about this medicine, talk to your doctor, pharmacist, orhealth care provider.  2022 Elsevier/Gold Standard (2019-10-19 15:07:49)  Standing Labs We placed an order today for your standing lab work.   Please have your standing labs drawn in 1 month, 3 months and every 5 months.   If possible, please have your labs drawn 2 weeks prior to your appointment so that the provider can discuss your results at your appointment.  Please note that you may see your imaging and lab results in MyChart before we have reviewed them. We may be awaiting multiple results to interpret others before contacting you. Please allow our office up to 72 hours to thoroughly review all of the results before contacting the office for clarification of your results.  We have open lab daily: Monday through Thursday from 1:30-4:30 PM and Friday from 1:30-4:00 PM at the office of Dr. Janalyn Rouse  [place], [facility].   Please be advised, all patients with office appointments requiring lab work will take precedent over walk-in lab work.  If possible, please come for your lab work on Monday and Friday afternoons, as you may experience shorter wait times. The office is located at [address], [place], [place] [address] No appointment  is necessary.   Labs are drawn by [facility]. Please bring your co-pay at the time of your lab draw.  You may receive a bill from [facility] for your lab work.  If you wish to have your labs drawn at another location, please call the office 24 hours in advance to send orders.  If you have any questions regarding directions or hours of operation,  please call [phone].   As a reminder, please drink plenty of water prior to coming for your lab work. Thanks!   Vaccines You are taking a medication(s) that can suppress your immune system.  The following immunizations are recommended: Flu annually Covid-19  Td/Tdap (tetanus, diphtheria, pertussis) every 10 years Pneumonia (Prevnar 15 then Pneumovax 23 at least 1 year apart.  Alternatively, can take Prevnar 20 without needing additional dose) Shingrix (after age 5): 2 doses from 4 weeks to 6 months apart  Please check with your PCP to make sure you are up to date.   Heart Disease Prevention   Your inflammatory disease increases your risk of heart disease which includes heart attack, stroke, atrial fibrillation (irregular heartbeats), high blood pressure, heart failure and atherosclerosis (plaque in the arteries).  It is important to reduce your risk by:   Keep blood pressure, cholesterol, and blood sugar at healthy levels   Smoking Cessation   Maintain a healthy weight  BMI 20-25   Eat a healthy diet  Plenty of fresh fruit, vegetables, and whole grains  Limit saturated fats, foods high in sodium, and added sugars  DASH and Mediterranean diet   Increase physical activity  Recommend moderate physically activity for 150 minutes per week/ 30 minutes a day for five days a week These can be broken up into three separate ten-minute sessions during the day.   Reduce Stress  Meditation, slow breathing exercises, yoga, coloring books  Dental visits twice a year

## 2020-12-23 ENCOUNTER — Other Ambulatory Visit: Payer: Self-pay | Admitting: Obstetrics

## 2020-12-23 DIAGNOSIS — E559 Vitamin D deficiency, unspecified: Secondary | ICD-10-CM

## 2021-01-05 NOTE — Progress Notes (Deleted)
Office Visit Note  Patient: Danielle Holden             Date of Birth: 1970-10-22           MRN: [id number]             PCP: Patient, No Pcp Per (Inactive) Referring: No ref. provider found Visit Date: 01/19/2021 Occupation: @GUAROCC @  Subjective:  No chief complaint on file.   History of Present Illness: Danielle Holden is a 50 y.o. female ***   Activities of Daily Living:  Patient reports morning stiffness for *** {minute/hour:19697}.   Patient {ACTIONS;DENIES/REPORTS:21021675::"Denies"} nocturnal pain.  Difficulty dressing/grooming: {ACTIONS;DENIES/REPORTS:21021675::"Denies"} Difficulty climbing stairs: {ACTIONS;DENIES/REPORTS:21021675::"Denies"} Difficulty getting out of chair: {ACTIONS;DENIES/REPORTS:21021675::"Denies"} Difficulty using hands for taps, buttons, cutlery, and/or writing: {ACTIONS;DENIES/REPORTS:21021675::"Denies"}  No Rheumatology ROS completed.   PMFS History:  Patient Active Problem List   Diagnosis Date Noted   History of kidney stones 05/29/2018   History of asthma 05/29/2018   Myofascial pain 05/29/2018   History of IBS 05/29/2018   History of gastroesophageal reflux (GERD) 05/29/2018   History of TIA (transient ischemic attack) 05/29/2018   Primary dysmenorrhea 06/13/2017   Generalized anxiety disorder 07/26/2014   Migraine without aura and without status migrainosus, not intractable 07/26/2014   Hx of migraines 01/27/2014   Migraine, unspecified, without mention of intractable migraine without mention of status migrainosus 09/28/2013   Mastodynia, female 07/13/2013   Abnormal uterine bleeding (AUB) 07/13/2013   Iron deficiency anemia 06/30/2013   TIA (transient ischemic attack) 06/27/2013   Acute right-sided weakness 06/27/2013   Anemia 06/27/2013   Menorrhagia 06/27/2013   Chest pain 06/27/2013    Past Medical History:  Diagnosis Date   Abnormal uterine bleeding    Anxiety    Arthritis    hands, knees, shoulder, back     Asthma    Colitis    History of   Complication of anesthesia 01/1992   During tubal ligation heart stopped and she stopped breathing   Depression    Environmental and seasonal allergies    Fatigue    Gallstones    GERD (gastroesophageal reflux disease)    Heart rate slow    per watch   History of kidney stones    History of shingles    Hypoglycemia    IBS (irritable bowel syndrome)    Iron deficiency anemia    receive iron infusion   Lactose intolerance    Lumbar herniated disc    per patient   Lupus (HCC)    Migraine    Numbness and tingling of both upper extremities    first thing in the morning when she wakes up   Numbness and tingling of lower extremity    first thing in the morning when she wakes up   Osteoporosis    per patient, DEXA in 07/2019   RA (rheumatoid arthritis) (HCC)    Ventral hernia    Vertigo     Family History  Problem Relation Age of Onset   Suicidality Father    Diabetes Maternal Grandmother    Breast cancer Maternal Grandmother    Cervical cancer Paternal Grandmother    Heart defect Son    Healthy Son    Healthy Daughter    Past Surgical History:  Procedure Laterality Date   APPENDECTOMY     CESAREAN SECTION     CHOLECYSTECTOMY [name] 08/07/2017   Procedure: LAPAROSCOPIC CHOLECYSTECTOMY, PRIMARY REPAIR OF UMBILICAL HERNIA;  Surgeon: 08/09/2017, MD;  Location: [facility];  Service: General;  Laterality: [name];   COLONOSCOPY     IUD REMOVAL [name] 11/12/2014   TUBAL LIGATION     UMBILICAL HERNIA REPAIR     Social History   Social History Narrative   Patient lives at home alone and she is disabled.   Education 9th grade.   Right handed.   Caffeine one cup of coffee not daily.    Immunization History  Administered Date(s) Administered   Influenza,inj,Quad PF,6+ Mos 02/11/2019   Influenza-Unspecified 01/13/2020   PFIZER(Purple Top)SARS-COV-2 Vaccination 08/01/2019, 08/22/2019, 03/04/2020   Tetanus 01/13/2020     Objective: Vital  Signs: LMP 07/20/2018 (Exact Date)    Physical Exam   Musculoskeletal Exam: ***  CDAI Exam: CDAI Score: -- Patient Global: --; Provider Global: -- Swollen: --; Tender: -- Joint Exam 01/19/2021   No joint exam has been documented for this visit   There is currently no information documented on the homunculus. Go to the Rheumatology activity and complete the homunculus joint exam.  Investigation: No additional findings.  Imaging: No results found.  Recent Labs: Lab Results  Component Value Date   WBC 7.0 11/02/2020   HGB 14.0 11/02/2020   PLT 255 11/02/2020   NA 141 11/02/2020   K 3.9 11/02/2020   CL 107 11/02/2020   CO2 25 11/02/2020   GLUCOSE 84 11/02/2020   BUN 11 11/02/2020   CREATININE 0.68 11/02/2020   BILITOT 0.6 11/02/2020   ALKPHOS 77 11/02/2020   AST 22 11/02/2020   ALT 20 11/02/2020   PROT 7.4 11/02/2020   ALBUMIN 4.2 11/02/2020   CALCIUM 9.2 11/02/2020   GFRAA 114 04/11/2020    Speciality Comments: No specialty comments available.  Procedures:  No procedures performed Allergies: Anesthetics, amide; Benzocaine; Black pepper [piper]; Mushroom extract complex; Trazodone and nefazodone; Amoxicillin; Bee venom; Benadryl [diphenhydramine]; Effexor [venlafaxine]; Lactose intolerance (gi); Nortriptyline; Nsaids; and Other   Assessment / Plan:     Visit Diagnoses: No diagnosis found.  Orders: No orders of the defined types were placed in this encounter.  No orders of the defined types were placed in this encounter.   Face-to-face time spent with patient was *** minutes. Greater than 50% of time was spent in counseling and coordination of care.  Follow-Up Instructions: No follow-ups on file.   Ellen Henri, CMA  Note - This record has been created using Animal nutritionist.  Chart creation errors have been sought, but may not always  have been located. Such creation errors do not reflect on  the standard of medical care.

## 2021-01-19 ENCOUNTER — Ambulatory Visit: Payer: Medicaid Other | Admitting: Physician Assistant

## 2021-01-19 DIAGNOSIS — M810 Age-related osteoporosis without current pathological fracture: Secondary | ICD-10-CM

## 2021-01-19 DIAGNOSIS — M7061 Trochanteric bursitis, right hip: Secondary | ICD-10-CM

## 2021-01-19 DIAGNOSIS — Z8673 Personal history of transient ischemic attack (TIA), and cerebral infarction without residual deficits: Secondary | ICD-10-CM

## 2021-01-19 DIAGNOSIS — Z8709 Personal history of other diseases of the respiratory system: Secondary | ICD-10-CM

## 2021-01-19 DIAGNOSIS — M5136 Other intervertebral disc degeneration, lumbar region: Secondary | ICD-10-CM

## 2021-01-19 DIAGNOSIS — Z79899 Other long term (current) drug therapy: Secondary | ICD-10-CM

## 2021-01-19 DIAGNOSIS — M3219 Other organ or system involvement in systemic lupus erythematosus: Secondary | ICD-10-CM

## 2021-01-19 DIAGNOSIS — R5383 Other fatigue: Secondary | ICD-10-CM

## 2021-01-19 DIAGNOSIS — Z8719 Personal history of other diseases of the digestive system: Secondary | ICD-10-CM

## 2021-01-19 DIAGNOSIS — R42 Dizziness and giddiness: Secondary | ICD-10-CM

## 2021-01-19 DIAGNOSIS — Z87442 Personal history of urinary calculi: Secondary | ICD-10-CM

## 2021-01-19 DIAGNOSIS — M7918 Myalgia, other site: Secondary | ICD-10-CM

## 2021-01-19 DIAGNOSIS — Z8669 Personal history of other diseases of the nervous system and sense organs: Secondary | ICD-10-CM

## 2021-01-19 DIAGNOSIS — F32A Depression, unspecified: Secondary | ICD-10-CM

## 2021-01-19 DIAGNOSIS — E559 Vitamin D deficiency, unspecified: Secondary | ICD-10-CM

## 2021-02-05 ENCOUNTER — Other Ambulatory Visit: Payer: Self-pay | Admitting: Physician Assistant

## 2021-02-06 ENCOUNTER — Other Ambulatory Visit: Payer: Self-pay | Admitting: Obstetrics

## 2021-02-06 DIAGNOSIS — Z0000 Encounter for general adult medical examination without abnormal findings: Secondary | ICD-10-CM

## 2021-02-06 NOTE — Telephone Encounter (Signed)
Next Visit: Due September 2022. Message sent to the front to schedule patient.   Last Visit: 12/08/2020  Last Fill: 11/10/2020  DX: Age-related osteoporosis without current pathological fracture   Current Dose per office note 12/08/2020: Fosamax 70mg  by mouth once weekly.  Labs: 11/02/2020 WNL  Okay to refill Fosamax?

## 2021-02-06 NOTE — Telephone Encounter (Signed)
I LMOM for patient to call to schedule a follow up appointment. 

## 2021-02-06 NOTE — Telephone Encounter (Signed)
Please schedule patient for a follow up appointment. Patient due September 2022. Thanks!

## 2021-02-16 ENCOUNTER — Other Ambulatory Visit: Payer: Self-pay | Admitting: Obstetrics

## 2021-02-16 DIAGNOSIS — E559 Vitamin D deficiency, unspecified: Secondary | ICD-10-CM

## 2021-03-20 NOTE — Progress Notes (Deleted)
NEUROLOGY FOLLOW UP OFFICE NOTE  Burnis Bressette [id number]  Assessment/Plan:   1.  Chronic migraine without aura, without status migrainosus, not intractable 2.  Migrainous vertigo   1.  Migraine prevention:  Qulipta 60mg  daily 3.  Migraine rescue:   Ubrelvy 100mg .   4.  Ondansetron for nausea 5.  Keep headache diary 6.  Follow up in 6 months.  Subjective:  Danielle Holden is a 50 year old female who follows up for migraine.  UPDATE: Last visit, advised to discontinue Tylenol.  Started Qulipta. Intensity:  *** Duration:  *** Frequency:  *** Frequency of abortive medication: *** Frequency of analgesics:  none Current NSAIDS/analgesics:  Tylenol, ibuprofen 800mg  Current triptans:  none Current ergotamine:  none Current anti-emetic:  Zofran 4mg  Current muscle relaxants:  none Current Antihypertensive medications:  none Current Antidepressant medications:  none Current Anticonvulsant medications:  none Current anti-CGRP:  Qulipta 60mg  daily; Ubrelvy 100mg  Current Vitamins/Herbal/Supplements:  MVI, D, prenatal, melatonin Current Antihistamines/Decongestants:  Flonase, Claritin Other therapy:  none Hormone/birth control:  none  Caffeine:  1 cup of diluted coffee daily. No soda Diet:  Drinks 3-4 16 oz bottles of water daily.  Lactose intolerant Exercise:  Walking Depression:  no; Anxiety:  yes Other pain:  She has chronic pain with lupus Other autoimmune labs, including sed rate, were unremarkable. Sleep hygiene:  Poor.  Trouble staying asleep.  3 to 4 hours a night.  Just started melatonin  HISTORY:  She started getting migraines at 50 years old.  Sometimes they are mild but other times may be severe.  Either temple, bilateral occipital, bifrontal, neck, pounding.  Associated blurred vision and spots, otalgia, vertigo, nausea, photophobia, phonophobia, osmophobia, face flushed.  Usually lays down to rest in dark room and may last 1-2 hours.  If she cannot rest, 3-4  hours.  Occurs every other day.  Triggers included seasonal allergies, loud noise and caffeine withdrawal.  Treats with Tylenol.   Has lupus with chronic pain syndrome.  Autoimmune workup otherwise unremarkable.  She has chronic unexplained right sided numbness of face, arm and leg previously worked up.  MRI of brain with and without contrast and MRA of head on 06/28/2013 to evaluate, were personally reviewed, and were unremarkable.      Past NSAIDS/analgesics:  Norco, naproxen, tramadol Past abortive triptans:  Rizatriptan Past abortive ergotamine:  none Past muscle relaxants:  Flexeril Past anti-emetic:  Zofran Past antihypertensive medications:  propranolol Past antidepressant medications:  venlafaxine Past anticonvulsant medications:  topiramate Past anti-CGRP:  none Past vitamins/Herbal/Supplements:  none Past antihistamines/decongestants:  Benadryl Other past therapies:  none    Family history of headache:  unknown  PAST MEDICAL HISTORY: Past Medical History:  Diagnosis Date   Abnormal uterine bleeding    Anxiety    Arthritis    hands, knees, shoulder, back    Asthma    Colitis    History of   Complication of anesthesia 01/1992   During tubal ligation heart stopped and she stopped breathing   Depression    Environmental and seasonal allergies    Fatigue    Gallstones    GERD (gastroesophageal reflux disease)    Heart rate slow    per watch   History of kidney stones    History of shingles    Hypoglycemia    IBS (irritable bowel syndrome)    Iron deficiency anemia    receive iron infusion   Lactose intolerance    Lumbar herniated disc  per patient   Lupus ([place])    Migraine    Numbness and tingling of both upper extremities    first thing in the morning when she wakes up   Numbness and tingling of lower extremity    first thing in the morning when she wakes up   Osteoporosis    per patient, DEXA in 07/2019   RA (rheumatoid arthritis) ([place])    Ventral  hernia    Vertigo     MEDICATIONS: Current Outpatient Medications on File Prior to Visit  Medication Sig Dispense Refill   acetaminophen (TYLENOL) 325 MG tablet Take 650 mg by mouth every 6 (six) hours as needed.     albuterol (VENTOLIN HFA) 108 (90 Base) MCG/ACT inhaler INHALE 1 PUFF INTO THE LUNGS AS NEEDED FOR WHEEZING OR SHORTNESS OF BREATH. 18 each 5   alendronate (FOSAMAX) 70 MG tablet TAKE 1 TABLET BY MOUTH ONCE A WEEK. TAKE WITH A FULL GLASS OF WATER ON AN EMPTY STOMACH. 12 tablet 0   Doxylamine Succinate, Sleep, (SLEEP AID PO) Take by mouth at bedtime.     EPINEPHRINE 0.3 mg/0.3 mL IJ SOAJ injection INJECT 0.3 MLS (0.3 MG TOTAL) INTO THE MUSCLE AS NEEDED FOR ANAPHYLAXIS. 2 each 2   fluticasone (FLONASE) 50 MCG/ACT nasal spray PLACE 1 SPRAY INTO BOTH NOSTRILS DAILY. (Patient taking differently: Place 1 spray into both nostrils as needed.) 16 mL 5   hydroxychloroquine (PLAQUENIL) 200 MG tablet Take 200mg  by mouth twice daily, Monday through Friday only. None on Saturday or Sunday. 120 tablet 0   ibuprofen (ADVIL) 800 MG tablet Take 1 tablet (800 mg total) by mouth every 8 (eight) hours as needed. Take after a meal. 30 tablet 5   loratadine (CLARITIN) 10 MG tablet TAKE 1 TABLET BY MOUTH EVERY DAY 30 tablet 11   Multiple Vitamin (MULTIVITAMIN PO) Take by mouth daily.     ondansetron (ZOFRAN ODT) 4 MG disintegrating tablet Take 1 tablet (4 mg total) by mouth every 8 (eight) hours as needed for nausea or vomiting. 20 tablet 5   Prenatal Vit-Fe Phos-FA-Omega (VITAFOL GUMMIES) 3.33-0.333-34.8 MG CHEW CHEW 3 TABLETS BY MOUTH DAILY BEFORE BREAKFAST. 90 tablet 4   Vitamin D, Ergocalciferol, (DRISDOL) 1.25 MG (50000 UNIT) CAPS capsule TAKE 1 CAPSULE BY MOUTH EVERY 7 DAYS FOR 6 WEEKS. 6 capsule 0   No current facility-administered medications on file prior to visit.    ALLERGIES: Allergies  Allergen Reactions   Anesthetics, Amide Anaphylaxis and Other (See Comments)    Heart stopped and  stopped breathing   Benzocaine Anaphylaxis   Black Pepper [Piper] Anaphylaxis    Throat swelling   Mushroom Extract Complex Anaphylaxis    Mouth swelling   Trazodone And Nefazodone Anaphylaxis    Tongue swells.   Amoxicillin Hives and Diarrhea    Has patient had a PCN reaction causing immediate rash, facial/tongue/throat swelling, SOB or lightheadedness with hypotension: No Has patient had a PCN reaction causing severe rash involving mucus membranes or skin necrosis: Yes Has patient had a PCN reaction that required hospitalization: Yes Has patient had a PCN reaction occurring within the last 10 years: Yes If all of the above answers are "NO", then may proceed with Cephalosporin use.    Bee Venom Swelling   Benadryl [Diphenhydramine]     Heavy periods    Effexor [Venlafaxine] Hives   Lactose Intolerance (Gi)     Stomach pain   Nortriptyline Hives   Nsaids     Make colitis  worse   Other Diarrhea    Red meat - stomach pains, bleeding     FAMILY HISTORY: Family History  Problem Relation Age of Onset   Suicidality Father    Diabetes Maternal Grandmother    Breast cancer Maternal Grandmother    Cervical cancer Paternal Grandmother    Heart defect Son    Healthy Son    Healthy Daughter       Objective:  *** General: No acute distress.  Patient appears ***-groomed.   Head:  Normocephalic/atraumatic Eyes:  Fundi examined but not visualized Neck: supple, no paraspinal tenderness, full range of motion Heart:  Regular rate and rhythm Lungs:  Clear to auscultation bilaterally Back: No paraspinal tenderness Neurological Exam: alert and oriented to person, place, and time.  Speech fluent and not dysarthric, language intact.  CN II-XII intact. Bulk and tone normal, muscle strength 5/5 throughout.  Sensation to light touch intact.  Deep tendon reflexes 2+ throughout, toes downgoing.  Finger to nose testing intact.  Gait normal, Romberg negative.   Shon Millet, DO

## 2021-03-21 ENCOUNTER — Ambulatory Visit: Payer: Medicaid Other | Admitting: Neurology

## 2021-03-22 ENCOUNTER — Other Ambulatory Visit: Payer: Self-pay | Admitting: Obstetrics

## 2021-03-22 DIAGNOSIS — Z1231 Encounter for screening mammogram for malignant neoplasm of breast: Secondary | ICD-10-CM

## 2021-04-11 ENCOUNTER — Other Ambulatory Visit: Payer: Self-pay | Admitting: Obstetrics

## 2021-04-11 DIAGNOSIS — E559 Vitamin D deficiency, unspecified: Secondary | ICD-10-CM

## 2021-04-25 ENCOUNTER — Encounter: Payer: Self-pay | Admitting: Nurse Practitioner

## 2021-04-25 ENCOUNTER — Ambulatory Visit
Admission: RE | Admit: 2021-04-25 | Discharge: 2021-04-25 | Disposition: A | Discharge status: Home / Self Care | Payer: Medicaid Other | Source: Ambulatory Visit | Attending: Obstetrics | Admitting: Obstetrics

## 2021-04-25 DIAGNOSIS — Z1231 Encounter for screening mammogram for malignant neoplasm of breast: Secondary | ICD-10-CM | POA: Diagnosis not present

## 2021-05-16 ENCOUNTER — Ambulatory Visit: Payer: Medicaid Other | Admitting: Rheumatology

## 2021-07-26 ENCOUNTER — Other Ambulatory Visit: Payer: Self-pay

## 2021-07-26 ENCOUNTER — Encounter: Payer: Self-pay | Admitting: Obstetrics

## 2021-07-26 ENCOUNTER — Ambulatory Visit (INDEPENDENT_AMBULATORY_CARE_PROVIDER_SITE_OTHER): Payer: Medicaid Other | Admitting: Obstetrics

## 2021-07-26 ENCOUNTER — Other Ambulatory Visit (HOSPITAL_COMMUNITY)
Admission: RE | Admit: 2021-07-26 | Discharge: 2021-07-26 | Disposition: A | Discharge status: Home / Self Care | Payer: Medicaid Other | Source: Ambulatory Visit | Attending: Obstetrics | Admitting: Obstetrics

## 2021-07-26 VITALS — BP 114/75 | HR 60 | Ht 63.5 in | Wt 162.5 lb

## 2021-07-26 DIAGNOSIS — Z0000 Encounter for general adult medical examination without abnormal findings: Secondary | ICD-10-CM

## 2021-07-26 DIAGNOSIS — R102 Pelvic and perineal pain: Secondary | ICD-10-CM | POA: Diagnosis not present

## 2021-07-26 DIAGNOSIS — Z01419 Encounter for gynecological examination (general) (routine) without abnormal findings: Secondary | ICD-10-CM | POA: Insufficient documentation

## 2021-07-26 DIAGNOSIS — Z780 Asymptomatic menopausal state: Secondary | ICD-10-CM

## 2021-07-26 DIAGNOSIS — M810 Age-related osteoporosis without current pathological fracture: Secondary | ICD-10-CM | POA: Diagnosis not present

## 2021-07-26 DIAGNOSIS — N898 Other specified noninflammatory disorders of vagina: Secondary | ICD-10-CM | POA: Diagnosis not present

## 2021-07-26 DIAGNOSIS — D508 Other iron deficiency anemias: Secondary | ICD-10-CM | POA: Diagnosis not present

## 2021-07-26 MED ORDER — VITAMIN D (ERGOCALCIFEROL) 1.25 MG (50000 UNIT) PO CAPS: 50000.0000 [IU] | ORAL_CAPSULE | ORAL | 0 refills | Status: DC

## 2021-07-26 NOTE — Progress Notes (Signed)
GYN annual ?Reports no menses for 3 years, pain in left "ovary" ?Denies vag discharge/ irritation ?Reports abnormal mammogram, issue with left breast ? ?Wants different inhaler, no PCP. ?Has lupus, fibromyalgia, arthritis, sees Dr. Corliss Skainseveshwar ?

## 2021-07-26 NOTE — Progress Notes (Signed)
? ?Subjective: ? ? ?  ?  ? Danielle Holden is a 51 y.o. female here for a routine exam.  Current complaints:  Vaginal discharge and pelvic pain. ? ?Personal health questionnaire:  ?Is patient [name], have a family history of breast and/or ovarian cancer: yes ?Is there a family history of uterine cancer diagnosed at age < 84, gastrointestinal cancer, urinary tract cancer, family member who is a Field seismologist syndrome-associated carrier: no ?Is the patient overweight and hypertensive, family history of diabetes, personal history of gestational diabetes, preeclampsia or PCOS: no ?Is patient over 93, have PCOS,  family history of premature CHD under age 80, diabetes, smoke, have hypertension or peripheral artery disease:  no ?At any time, has a partner hit, kicked or otherwise hurt or frightened you?: no ?Over the past 2 weeks, have you felt down, depressed or hopeless?: no ?Over the past 2 weeks, have you felt little interest or pleasure in doing things?:no ? ? ?Gynecologic History ?Patient's last menstrual period was 07/20/2018 (exact date). ?Contraception: post menopausal status ?Last Pap: 2020. Results were: normal ?Last mammogram: 04-2021. Results were: normal ? ?Obstetric History ?OB History  ?Gravida Para Term Preterm AB Living  ?4 3 3   1 3   ?SAB IAB Ectopic Multiple Live Births  ?1       3  ?  ?# Outcome Date GA Lbr Len/2nd Weight Sex Delivery Anes PTL Lv  ?4 SAB 2009 [redacted]w[redacted]d         ?3 Term 1993 [redacted]w[redacted]d   M Vag-Spont   LIV  ?2 Term 08/1990 [redacted]w[redacted]d   F Vag-Spont   LIV  ?1 Term 58    M CS-LTranv   LIV  ? ? ?Past Medical History:  ?Diagnosis Date  ? Abnormal uterine bleeding   ? Anxiety   ? Arthritis   ? hands, knees, shoulder, back   ? Asthma   ? Colitis   ? History of  ? Complication of anesthesia 01/1992  ? During tubal ligation heart stopped and she stopped breathing  ? Depression   ? Environmental and seasonal allergies   ? Fatigue   ? Gallstones   ? GERD (gastroesophageal reflux disease)   ? Heart rate  slow   ? per watch  ? History of kidney stones   ? History of shingles   ? Hypoglycemia   ? IBS (irritable bowel syndrome)   ? Iron deficiency anemia   ? receive iron infusion  ? Lactose intolerance   ? Lumbar herniated disc   ? per patient  ? Lupus ([place])   ? Migraine   ? Numbness and tingling of both upper extremities   ? first thing in the morning when she wakes up  ? Numbness and tingling of lower extremity   ? first thing in the morning when she wakes up  ? Osteoporosis   ? per patient, DEXA in 07/2019  ? RA (rheumatoid arthritis) ([place])   ? Ventral hernia   ? Vertigo   ?  ?Past Surgical History:  ?Procedure Laterality Date  ? APPENDECTOMY    ? CESAREAN SECTION    ? CHOLECYSTECTOMY [name] 08/07/2017  ? Procedure: LAPAROSCOPIC CHOLECYSTECTOMY, PRIMARY REPAIR OF UMBILICAL HERNIA;  Surgeon: Ileana Roup, MD;  Location: [facility];  Service: General;  Laterality: [name];  ? COLONOSCOPY    ? IUD REMOVAL [name] 11/12/2014  ? TUBAL LIGATION    ? UMBILICAL HERNIA REPAIR    ?  ? ?Current Outpatient Medications:  ?  acetaminophen (TYLENOL) 325 MG tablet, Take 650 mg by mouth every 6 (six) hours as needed., Disp: , Rfl:  ?  albuterol (VENTOLIN HFA) 108 (90 Base) MCG/ACT inhaler, INHALE 1 PUFF INTO THE LUNGS AS NEEDED FOR WHEEZING OR SHORTNESS OF BREATH., Disp: 18 each, Rfl: 5 ?  EPINEPHRINE 0.3 mg/0.3 mL IJ SOAJ injection, INJECT 0.3 MLS (0.3 MG TOTAL) INTO THE MUSCLE AS NEEDED FOR ANAPHYLAXIS., Disp: 2 each, Rfl: 2 ?  fluticasone (FLONASE) 50 MCG/ACT nasal spray, PLACE 1 SPRAY INTO BOTH NOSTRILS DAILY. (Patient taking differently: Place 1 spray into both nostrils as needed.), Disp: 16 mL, Rfl: 5 ?  hydroxychloroquine (PLAQUENIL) 200 MG tablet, Take 200mg  by mouth twice daily, Monday through Friday only. None on Saturday or Sunday., Disp: 120 tablet, Rfl: 0 ?  loratadine (CLARITIN) 10 MG tablet, TAKE 1 TABLET BY MOUTH EVERY DAY, Disp: 30 tablet, Rfl: 11 ?  ondansetron (ZOFRAN ODT) 4 MG disintegrating tablet, Take 1 tablet (4 mg  total) by mouth every 8 (eight) hours as needed for nausea or vomiting., Disp: 20 tablet, Rfl: 5 ?  Vitamin D, Ergocalciferol, (DRISDOL) 1.25 MG (50000 UNIT) CAPS capsule, Take 1 capsule (50,000 Units total) by mouth every 7 (seven) days., Disp: 5 capsule, Rfl: 0 ?  alendronate (FOSAMAX) 70 MG tablet, TAKE 1 TABLET BY MOUTH ONCE A WEEK. TAKE WITH A FULL GLASS OF WATER ON AN EMPTY STOMACH. (Patient not taking: Reported on 07/26/2021), Disp: 12 tablet, Rfl: 0 ?  Doxylamine Succinate, Sleep, (SLEEP AID PO), Take by mouth at bedtime. (Patient not taking: Reported on 07/26/2021), Disp: , Rfl:  ?  ibuprofen (ADVIL) 800 MG tablet, Take 1 tablet (800 mg total) by mouth every 8 (eight) hours as needed. Take after a meal. (Patient not taking: Reported on 07/26/2021), Disp: 30 tablet, Rfl: 5 ?  Multiple Vitamin (MULTIVITAMIN PO), Take by mouth daily., Disp: , Rfl:  ?  Prenatal Vit-Fe Phos-FA-Omega (VITAFOL GUMMIES) 3.33-0.333-34.8 MG CHEW, CHEW 3 TABLETS BY MOUTH DAILY BEFORE BREAKFAST. (Patient not taking: Reported on 07/26/2021), Disp: 90 tablet, Rfl: 4 ?  Vitamin D, Ergocalciferol, (DRISDOL) 1.25 MG (50000 UNIT) CAPS capsule, TAKE 1 CAPSULE BY MOUTH EVERY 7 DAYS FOR 6 WEEKS., Disp: 6 capsule, Rfl: 0 ?Allergies  ?Allergen Reactions  ? Anesthetics, Amide Anaphylaxis and Other (See Comments)  ?  Heart stopped and stopped breathing  ? Benzocaine Anaphylaxis  ? Black Pepper [Piper] Anaphylaxis  ?  Throat swelling  ? Mushroom Extract Complex Anaphylaxis  ?  Mouth swelling  ? Trazodone And Nefazodone Anaphylaxis  ?  Tongue swells.  ? Amoxicillin Hives and Diarrhea  ?  Has patient had a PCN reaction causing immediate rash, facial/tongue/throat swelling, SOB or lightheadedness with hypotension: No ?Has patient had a PCN reaction causing severe rash involving mucus membranes or skin necrosis: Yes ?Has patient had a PCN reaction that required hospitalization: Yes ?Has patient had a PCN reaction occurring within the last 10 years: Yes ?If  all of the above answers are "NO", then may proceed with Cephalosporin use. ?  ? Bee Venom Swelling  ? Benadryl [Diphenhydramine]   ?  Heavy periods   ? Effexor [Venlafaxine] Hives  ? Lactose Intolerance (Gi)   ?  Stomach pain  ? Nortriptyline Hives  ? Nsaids   ?  Make colitis worse  ? Other Diarrhea  ?  Red meat - stomach pains, bleeding   ?  ?Social History  ? ?Tobacco Use  ? Smoking status: Never  ? Smokeless tobacco: Never  ?  Substance Use Topics  ? Alcohol use: No  ?  Alcohol/week: 0.0 standard drinks  ?  ?Family History  ?Problem Relation Age of Onset  ? Suicidality Father   ? Diabetes Maternal Grandmother   ? Breast cancer Maternal Grandmother   ? Cervical cancer Paternal Grandmother   ? Heart defect Son   ? Healthy Son   ? Healthy Daughter   ?  ? ? ?Review of Systems ? ?Constitutional: negative for fatigue and weight loss ?Respiratory: negative for cough and wheezing ?Cardiovascular: negative for chest pain, fatigue and palpitations ?Gastrointestinal: negative for abdominal pain and change in bowel habits ?Musculoskeletal:negative for myalgias ?Neurological: negative for gait problems and tremors ?Behavioral/Psych: negative for abusive relationship, depression ?Endocrine: negative for temperature intolerance    ?Genitourinary: positive for vaginal discharge and pelvic pain.  negative for abnormal menstrual periods, genital lesions, hot flashes, sexual problems  ?Integument/breast: negative for breast lump, breast tenderness, nipple discharge and skin lesion(s) ? ?  ?Objective:  ? ?    ?BP 114/75   Pulse 60   Ht 5' 3.5" (1.613 m)   Wt 162 lb 8 oz (73.7 kg)   LMP 07/20/2018 (Exact Date)   BMI 28.33 kg/m?  ?General:   Alert and no distress  ?Skin:   no rash or abnormalities  ?Lungs:   clear to auscultation bilaterally  ?Heart:   regular rate and rhythm, S1, S2 normal, no murmur, click, rub or gallop  ?Breasts:   normal without suspicious masses, skin or nipple changes or axillary nodes  ?Abdomen:  normal  findings: no organomegaly, soft, non-tender and no hernia  ?Pelvis:  External genitalia: normal general appearance ?Urinary system: urethral meatus normal and bladder without fullness, nontender ?Vaginal: normal

## 2021-07-27 LAB — CERVICOVAGINAL ANCILLARY ONLY
Bacterial Vaginitis (gardnerella): NEGATIVE
Candida Glabrata: NEGATIVE
Candida Vaginitis: NEGATIVE
Comment: NEGATIVE
Comment: NEGATIVE
Comment: NEGATIVE

## 2021-07-27 LAB — COMPREHENSIVE METABOLIC PANEL
ALT: 13 IU/L (ref 0–32)
AST: 16 IU/L (ref 0–40)
Albumin/Globulin Ratio: 1.7 (ref 1.2–2.2)
Albumin: 5 g/dL — ABNORMAL HIGH (ref 3.8–4.9)
Alkaline Phosphatase: 125 IU/L — ABNORMAL HIGH (ref 44–121)
BUN/Creatinine Ratio: 13 (ref 9–23)
BUN: 10 mg/dL (ref 6–24)
Bilirubin Total: 0.4 mg/dL (ref 0.0–1.2)
CO2: 22 mmol/L (ref 20–29)
Calcium: 9.9 mg/dL (ref 8.7–10.2)
Chloride: 105 mmol/L (ref 96–106)
Creatinine, Ser: 0.76 mg/dL (ref 0.57–1.00)
Globulin, Total: 2.9 g/dL (ref 1.5–4.5)
Glucose: 79 mg/dL (ref 70–99)
Potassium: 4.4 mmol/L (ref 3.5–5.2)
Sodium: 145 mmol/L — ABNORMAL HIGH (ref 134–144)
Total Protein: 7.9 g/dL (ref 6.0–8.5)
eGFR: 94 mL/min/{1.73_m2} (ref >59)

## 2021-07-27 LAB — CBC
Hematocrit: 48.2 % — ABNORMAL HIGH (ref 34.0–46.6)
Hemoglobin: 16.1 g/dL — ABNORMAL HIGH (ref 11.1–15.9)
MCH: 31.8 pg (ref 26.6–33.0)
MCHC: 33.4 g/dL (ref 31.5–35.7)
MCV: 95 fL (ref 79–97)
Platelets: 280 10*3/uL (ref 150–450)
RBC: 5.06 x10E6/uL (ref 3.77–5.28)
RDW: 12.1 % (ref 11.7–15.4)
WBC: 10.6 10*3/uL (ref 3.4–10.8)

## 2021-07-27 LAB — LIPID PANEL
Chol/HDL Ratio: 2.3 ratio (ref 0.0–4.4)
Cholesterol, Total: 190 mg/dL (ref 100–199)
HDL: 83 mg/dL (ref >39)
LDL Chol Calc (NIH): 97 mg/dL (ref 0–99)
Triglycerides: 51 mg/dL (ref 0–149)
VLDL Cholesterol Cal: 10 mg/dL (ref 5–40)

## 2021-07-28 LAB — CYTOLOGY - PAP
Comment: NEGATIVE
Diagnosis: NEGATIVE
High risk HPV: NEGATIVE

## 2021-07-31 ENCOUNTER — Other Ambulatory Visit: Payer: Self-pay

## 2021-07-31 ENCOUNTER — Ambulatory Visit (HOSPITAL_BASED_OUTPATIENT_CLINIC_OR_DEPARTMENT_OTHER)
Admission: RE | Admit: 2021-07-31 | Discharge: 2021-07-31 | Disposition: A | Discharge status: Home / Self Care | Payer: Medicaid Other | Source: Ambulatory Visit | Attending: Obstetrics | Admitting: Obstetrics

## 2021-07-31 DIAGNOSIS — R102 Pelvic and perineal pain: Secondary | ICD-10-CM | POA: Diagnosis not present

## 2021-08-09 ENCOUNTER — Telehealth: Payer: Medicaid Other | Admitting: Obstetrics

## 2021-08-09 ENCOUNTER — Encounter: Payer: Self-pay | Admitting: Obstetrics

## 2021-08-09 ENCOUNTER — Other Ambulatory Visit: Payer: Self-pay | Admitting: Obstetrics

## 2021-08-09 DIAGNOSIS — R102 Pelvic and perineal pain: Secondary | ICD-10-CM

## 2021-08-09 DIAGNOSIS — T782XXD Anaphylactic shock, unspecified, subsequent encounter: Secondary | ICD-10-CM

## 2021-08-09 DIAGNOSIS — J301 Allergic rhinitis due to pollen: Secondary | ICD-10-CM

## 2021-08-09 DIAGNOSIS — Z780 Asymptomatic menopausal state: Secondary | ICD-10-CM

## 2021-08-09 NOTE — Progress Notes (Signed)
Pt being seen virtually for a follow up after a pelvic US. Pt has not new complaints at this time. ?

## 2021-08-09 NOTE — Progress Notes (Signed)
? ?TELEHEALTH GYNECOLOGY VISIT ENCOUNTER NOTE ? ?Provider location: [facility] [place]  ? ?Patient location: Home ? ?I connected with Danielle Holden on 08/09/21 at  9:35 AM EDT by telephone and verified that I am speaking with the correct person using two identifiers. Patient was unable to do MyChart audiovisual encounter due to technical difficulties, she tried several times.  ?  ?I discussed the limitations, risks, security and privacy concerns of performing an evaluation and management service by telephone and the availability of in person appointments. I also discussed with the patient that there may be a patient responsible charge related to this service. The patient expressed understanding and agreed to proceed. ?  ?History:  ?Danielle Holden is a 51 y.o. [phone] female being evaluated today for follow up for pelvic pain. She denies any abnormal vaginal discharge, bleeding, pelvic pain or other concerns.   ?  ?  ?Past Medical History:  ?Diagnosis Date  ? Abnormal uterine bleeding   ? Anxiety   ? Arthritis   ? hands, knees, shoulder, back   ? Asthma   ? Colitis   ? History of  ? Complication of anesthesia 01/1992  ? During tubal ligation heart stopped and she stopped breathing  ? Depression   ? Environmental and seasonal allergies   ? Fatigue   ? Gallstones   ? GERD (gastroesophageal reflux disease)   ? Heart rate slow   ? per watch  ? History of kidney stones   ? History of shingles   ? Hypoglycemia   ? IBS (irritable bowel syndrome)   ? Iron deficiency anemia   ? receive iron infusion  ? Lactose intolerance   ? Lumbar herniated disc   ? per patient  ? Lupus ([place])   ? Migraine   ? Numbness and tingling of both upper extremities   ? first thing in the morning when she wakes up  ? Numbness and tingling of lower extremity   ? first thing in the morning when she wakes up  ? Osteoporosis   ? per patient, DEXA in 07/2019  ? RA (rheumatoid arthritis) ([place])   ? Ventral hernia   ? Vertigo    ? ?Past Surgical History:  ?Procedure Laterality Date  ? APPENDECTOMY    ? CESAREAN SECTION    ? CHOLECYSTECTOMY [name] 08/07/2017  ? Procedure: LAPAROSCOPIC CHOLECYSTECTOMY, PRIMARY REPAIR OF UMBILICAL HERNIA;  Surgeon: Ileana Roup, MD;  Location: [facility];  Service: General;  Laterality: [name];  ? COLONOSCOPY    ? IUD REMOVAL [name] 11/12/2014  ? TUBAL LIGATION    ? UMBILICAL HERNIA REPAIR    ? ?The following portions of the patient's history were reviewed and updated as appropriate: allergies, current medications, past family history, past medical history, past social history, past surgical history and problem list.  ? ?Health Maintenance:  Normal pap and negative HRHPV on 07-26-2021.  Normal mammogram on 04-25-2021.  ? ?Review of Systems:  ?Pertinent items noted in HPI and remainder of comprehensive ROS otherwise negative. ? ?Physical Exam:  ? ?General:  Alert, oriented and cooperative.   ?Mental Status: Normal mood and affect perceived. Normal judgment and thought content.  ?Physical exam deferred due to nature of the encounter ? ?Labs and Imaging ?Results for orders placed or performed in visit on 07/26/21 (from the past 336 hour(s))  ?CBC  ? Collection Time: 07/26/21 11:22 AM  ?Result Value Ref Range  ? WBC 10.6 3.4 - 10.8 x10E3/uL  ?  RBC 5.06 3.77 - 5.28 x10E6/uL  ? Hemoglobin 16.1 (H) 11.1 - 15.9 g/dL  ? Hematocrit 48.2 (H) 34.0 - 46.6 %  ? MCV 95 79 - 97 fL  ? MCH 31.8 26.6 - 33.0 pg  ? MCHC 33.4 31.5 - 35.7 g/dL  ? RDW 12.1 11.7 - 15.4 %  ? Platelets 280 150 - 450 x10E3/uL  ?Comprehensive metabolic panel  ? Collection Time: 07/26/21 11:22 AM  ?Result Value Ref Range  ? Glucose 79 70 - 99 mg/dL  ? BUN 10 6 - 24 mg/dL  ? Creatinine, Ser 0.76 0.57 - 1.00 mg/dL  ? eGFR 94 >59 mL/min/1.73  ? BUN/Creatinine Ratio 13 9 - 23  ? Sodium 145 (H) 134 - 144 mmol/L  ? Potassium 4.4 3.5 - 5.2 mmol/L  ? Chloride 105 96 - 106 mmol/L  ? CO2 22 20 - 29 mmol/L  ? Calcium 9.9 8.7 - 10.2 mg/dL  ? Total Protein 7.9 6.0 - 8.5 g/dL   ? Albumin 5.0 (H) 3.8 - 4.9 g/dL  ? Globulin, Total 2.9 1.5 - 4.5 g/dL  ? Albumin/Globulin Ratio 1.7 1.2 - 2.2  ? Bilirubin Total 0.4 0.0 - 1.2 mg/dL  ? Alkaline Phosphatase 125 (H) 44 - 121 IU/L  ? AST 16 0 - 40 IU/L  ? ALT 13 0 - 32 IU/L  ?Lipid Profile  ? Collection Time: 07/26/21 11:22 AM  ?Result Value Ref Range  ? Cholesterol, Total 190 100 - 199 mg/dL  ? Triglycerides 51 0 - 149 mg/dL  ? HDL 83 >39 mg/dL  ? VLDL Cholesterol Cal 10 5 - 40 mg/dL  ? LDL Chol Calc (NIH) 97 0 - 99 mg/dL  ? Chol/HDL Ratio 2.3 0.0 - 4.4 ratio  ? ?US PELVIC COMPLETE WITH TRANSVAGINAL ? ?Result Date: 07/31/2021 ?CLINICAL DATA:  Pelvic pain EXAM: TRANSABDOMINAL AND TRANSVAGINAL ULTRASOUND OF PELVIS DOPPLER ULTRASOUND OF OVARIES TECHNIQUE: Both transabdominal and transvaginal ultrasound examinations of the pelvis were performed. Transabdominal technique was performed for global imaging of the pelvis including uterus, ovaries, adnexal regions, and pelvic cul-de-sac. It was necessary to proceed with endovaginal exam following the transabdominal exam to visualize the endometrium and ovaries. Color and duplex Doppler ultrasound was utilized to evaluate blood flow to the ovaries. COMPARISON:  11/02/2020 FINDINGS: Uterus Measurements: 6.4 x 3.5 x 3.7 cm = volume: 43.9 mL. No fibroids or other mass visualized. Uterus is retroverted. Endometrium Thickness: 7 mm.  No focal abnormality visualized. Right ovary Not sonographically visualized. Left ovary Measurements: 2.1 x 1.1 x 1.1 cm = volume: 1.3 mL. Normal appearance/no adnexal mass. Pulsed Doppler evaluation of both ovaries demonstrates normal low-resistance arterial and venous waveforms. Other findings Small amount of free fluid is seen in the pelvis. IMPRESSION: Small amount of free fluid in the pelvis may suggest recent rupture of ovarian cyst or follicle. Uterus is retroverted. Right ovary is not sonographically visualized. Pelvic sonogram is otherwise unremarkable. Electronically Signed    By: Elmer Picker M.D.   On: 07/31/2021 16:38      ? ? ?Assessment and Plan:  ?   ?1. Pelvic pain, resolved ?- Ultrasound WNL's, and indicates that she may have had an ovarian cyst that ruptured ? ?2. Postmenopause ?- doing well  ?   ?  ?I discussed the assessment and treatment plan with the patient. The patient was provided an opportunity to ask questions and all were answered. The patient agreed with the plan and demonstrated an understanding of the instructions. ?  ?The  patient was advised to call back or seek an in-person evaluation/go to the ED if the symptoms worsen or if the condition fails to improve as anticipated. ? ? ? ? ?Baltazar Najjar, MD ?[facility] [place] [place]  ?08/09/21  ?

## 2021-08-13 ENCOUNTER — Other Ambulatory Visit: Payer: Self-pay | Admitting: Obstetrics

## 2021-08-13 NOTE — Progress Notes (Signed)
I have spent a total of 10 minutes of  non-face-to-face time, excluding clinical staff time, reviewing notes and preparing to see patient, ordering tests and/or medications, and counseling the patient.  ? ?Brock Bad, MD ?08/13/2021 7:06 AM  ?

## 2021-08-25 ENCOUNTER — Encounter (HOSPITAL_BASED_OUTPATIENT_CLINIC_OR_DEPARTMENT_OTHER): Payer: Self-pay | Admitting: Nurse Practitioner

## 2021-08-25 ENCOUNTER — Ambulatory Visit (HOSPITAL_BASED_OUTPATIENT_CLINIC_OR_DEPARTMENT_OTHER): Payer: Medicaid Other | Admitting: Nurse Practitioner

## 2021-08-25 VITALS — BP 128/88 | HR 86 | Ht 63.0 in | Wt 159.8 lb

## 2021-08-25 DIAGNOSIS — R42 Dizziness and giddiness: Secondary | ICD-10-CM | POA: Diagnosis not present

## 2021-08-25 DIAGNOSIS — G4700 Insomnia, unspecified: Secondary | ICD-10-CM | POA: Diagnosis not present

## 2021-08-25 DIAGNOSIS — M3219 Other organ or system involvement in systemic lupus erythematosus: Secondary | ICD-10-CM | POA: Diagnosis not present

## 2021-08-25 DIAGNOSIS — J45909 Unspecified asthma, uncomplicated: Secondary | ICD-10-CM

## 2021-08-25 DIAGNOSIS — J302 Other seasonal allergic rhinitis: Secondary | ICD-10-CM | POA: Diagnosis not present

## 2021-08-25 MED ORDER — ALBUTEROL SULFATE HFA 108 (90 BASE) MCG/ACT IN AERS: 1.0000 | INHALATION_SPRAY | RESPIRATORY_TRACT | 11 refills | Status: AC | PRN

## 2021-08-25 MED ORDER — LEVOCETIRIZINE DIHYDROCHLORIDE 5 MG PO TABS: 5.0000 mg | ORAL_TABLET | Freq: Every evening | ORAL | 6 refills | Status: DC

## 2021-08-25 MED ORDER — MECLIZINE HCL 25 MG PO TABS: 25.0000 mg | ORAL_TABLET | Freq: Three times a day (TID) | ORAL | 11 refills | Status: AC | PRN

## 2021-08-25 MED ORDER — MOMETASONE FUROATE 50 MCG/ACT NA SUSP: NASAL | 6 refills | Status: AC

## 2021-08-25 NOTE — Patient Instructions (Signed)
Thank you for choosing [place] at [facility] for your Primary Care needs. I am excited for the opportunity to partner with you to meet your health care goals. It was a pleasure meeting you today! ? ?Recommendations from today's visit: ?I am going to review your records and I will call you next Friday to go over your previous labs and we can see how you have been sleeping with the new medication.  ?We can also discuss to see if you mood feels better  ?We will also see how the vertigo is going with the meclizine ? ?Information on diet, exercise, and health maintenance recommendations are listed below. This is information to help you be sure you are on track for optimal health and monitoring.  ? ?Please look over this and let us know if you have any questions or if you have completed any of the health maintenance outside of [facility] so that we can be sure your records are up to date.  ?___________________________________________________________ ?About Me: ?I am an Adult-Geriatric Nurse Practitioner with a background in caring for patients for more than 20 years with a strong intensive care background. I provide primary care and sports medicine services to patients age 19 and older within this office. My education had a strong focus on caring for the older adult population, which I am passionate about. I am also the director of the APP Fellowship with [facility].  ? ?My desire is to provide you with the best service through preventive medicine and supportive care. I consider you a part of the medical team and value your input. I work diligently to ensure that you are heard and your needs are met in a safe and effective manner. I want you to feel comfortable with me as your provider and want you to know that your health concerns are important to me. ? ?For your information, our office hours are: ?Monday, Tuesday, and Thursday 8:00 AM - 5:00 PM ?Wednesday and Friday 8:00 AM - 12:00 PM.  ? ?In  my time away from the office I am teaching new APP's within the system and am unavailable, but my partner, Dr. Burnard Bunting is in the office for emergent needs.  ? ?If you have questions or concerns, please call our office at [phone] or send [place] a MyChart message and we will respond as quickly as possible.  ?____________________________________________________________ ?MyChart:  ?For all urgent or time sensitive needs we ask that you please call the office to avoid delays. Our number is [phone]. ?MyChart is not constantly monitored and due to the large volume of messages a day, replies may take up to 72 business hours. ? ?MyChart Policy: ?MyChart allows for you to see your visit notes, after visit summary, provider recommendations, lab and tests results, make an appointment, request refills, and contact your provider or the office for non-urgent questions or concerns. Providers are seeing patients during normal business hours and do not have built in time to review MyChart messages.  ?We ask that you allow a minimum of 3 business days for responses to [employer]. For this reason, please do not send urgent requests through [place]. Please call the office at [phone]. ?New and ongoing conditions may require a visit. We have virtual and in person visit available for your convenience.  ?Complex MyChart concerns may require a visit. Your provider may request you schedule a virtual or in person visit to ensure we are providing the best care possible. ?MyChart messages sent after  11:00 AM on Friday will not be received by the provider until Monday morning.  ?  ?Lab and Test Results: ?You will receive your lab and test results on MyChart as soon as they are completed and results have been sent by the lab or testing facility. Due to this service, you will receive your results BEFORE your provider.  ?I review lab and tests results each morning prior to seeing patients. Some results require collaboration with  other providers to ensure you are receiving the most appropriate care. For this reason, we ask that you please allow a minimum of 3-5 business days from the time the ALL results have been received for your provider to receive and review lab and test results and contact you about these.  ?Most lab and test result comments from the provider will be sent through [place]. Your provider may recommend changes to the plan of care, follow-up visits, repeat testing, ask questions, or request an office visit to discuss these results. You may reply directly to this message or call the office at [phone] to provide information for the provider or set up an appointment. ?In some instances, you will be called with test results and recommendations. Please let us know if this is preferred and we will make note of this in your chart to provide this for you.    ?If you have not heard a response to your lab or test results in 5 business days from all results returning to [facility], please call the office to let us know. We ask that you please avoid calling prior to this time unless there is an emergent concern. Due to high call volumes, this can delay the resulting process. ? ?After Hours: ?For all non-emergency after hours needs, please call the office at [phone] and select the option to reach the on-call provider service. On-call services are shared between multiple [facility] offices and therefore it will not be possible to speak directly with your provider. On-call providers may provide medical advice and recommendations, but are unable to provide refills for maintenance medications.  ?For all emergency or urgent medical needs after normal business hours, we recommend that you seek care at the closest Urgent Care or Emergency Department to ensure appropriate treatment in a timely manner.  ?[facility] has a 24 hour emergency room located on the ground floor for your convenience.  ? ?Urgent Concerns  During the Business Day ?Providers are seeing patients from 8AM to [place] with a busy schedule and are most often not able to respond to non-urgent calls until the end of the day or the next business day. ?If you should have URGENT concerns during the day, please call and speak to the nurse or schedule a same day appointment so that we can address your concern without delay.  ? ?Thank you, again, for choosing me as your health care partner. I appreciate your trust and look forward to learning more about you.  ? ?Worthy Keeler, DNP, AGNP-c ?___________________________________________________________ ? ?Health Maintenance Recommendations ?Screening Testing ?Mammogram ?Every 1 -2 years based on history and risk factors ?Starting at age 63 ?Pap Smear ?Ages 21-39 every 3 years ?Ages 51-65 every 5 years with HPV testing ?More frequent testing may be required based on results and history ?Colon Cancer Screening ?Every 1-10 years based on test performed, risk factors, and history ?Starting at age 37 ?Bone Density Screening ?Every 2-10 years based on history ?Starting at age 5 for women ?Recommendations for men differ based on  medication usage, history, and risk factors ?AAA Screening ?One time ultrasound ?Men 71-23 years old who have every smoked ?Lung Cancer Screening ?Low Dose Lung CT every 12 months ?Age 73-80 years with a 30 pack-year smoking history who still smoke or who have quit within the last 15 years ? ?Screening Labs ?Routine  Labs: Complete Blood Count (CBC), Complete Metabolic Panel (CMP), Cholesterol (Lipid Panel) ?Every 6-12 months based on history and medications ?May be recommended more frequently based on current conditions or previous results ?Hemoglobin A1c Lab ?Every 3-12 months based on history and previous results ?Starting at age 110 or earlier with diagnosis of diabetes, high cholesterol, BMI >26, and/or risk factors ?Frequent monitoring for patients with diabetes to ensure blood sugar  control ?Thyroid Panel (TSH w/ T3 & T4) ?Every 6 months based on history, symptoms, and risk factors ?May be repeated more often if on medication ?HIV ?One time testing for all patients 31 and older ?May be repeated m

## 2021-08-25 NOTE — Progress Notes (Signed)
Tollie EthSara E West [place], DNP, AGNP-c ?Primary Care & Sports Medicine ?[address]  [address] ?[place], [place] [address] ?[phone] ? ?New patient visit ? ? ?Patient: Danielle Holden   DOB: 05/30/1970   51 y.o. Female  MRN: [id number] ?Visit Date: 08/25/2021 ? ?Patient Care Team: ?Lidie Glade, Sung AmabileSara E, NP as PCP - General (Nurse Practitioner) ?Brock BadHarper, Charles A, MD as Consulting Physician (Obstetrics and Gynecology) ?Malachy MoodFeng, Yan, MD as Consulting Physician (Hematology) ?Pollyann SamplesBurton, Lacie K, NP as Nurse Practitioner (Nurse Practitioner) ? ?Today's healthcare provider: Tollie EthSara E. Lucky Trotta, NP  ? ?No chief complaint on file. ? ?Subjective  ?  ?Danielle BibleMarie Amber Menges is a 51 y.o. female who presents today as a new patient to establish care.  ?  ?Patient endorses the following concerns presently: ?Asthma and Allergies ?-Longstanding history of asthma which is worsened during allergy season.   ?-Currently using albuterol inhaler about every 4 hours during the day to help maintain control ?-She is not having any severe exacerbations that have not been controlled with as needed albuterol ?-She has used Flonase in the past to help control her allergies however she reports this was too drying and causing nosebleeds. ?-She has also previously used Claritin however did not find this was helpful enough for her allergy control ?-She is not currently taking any over-the-counter allergy medications however is experiencing increased allergy symptoms at this time ?-She denies fever, chills, shortness of breath, severe dyspnea at rest or with exertion ? ?Vertigo ?-She endorses a history of vertigo that is typically worse with allergies ?-She endorses waking up with severe dizziness and feeling as if the room is spinning ?-Symptoms are worse with head movement ?-She denies any weakness to her extremities, slurred speech, vision changes, difficulty eating/swallowing ?-She has not tried anything for this currently ? ?Lupus ?-She endorses a  history significant for lupus ?-She has been on Plaquenil off and on for many years ?-She tells me her symptoms are severe with widespread joint pain that is present every day ?-She does have a history of decreased kidney function which is typically followed every 6 months to 1 year ?-She denies any current fevers or chills, rashes, or concerns with current urinary output ?-She does have frequent pain and swelling of the joints ? ?Insomnia ?-She tells me that she has not been sleeping well for quite some time ?-She is not currently taking any medication but in the past has used doxylamine  ?-She does endorse increased mood swings that she feels are related to not sleeping well ? ?History reviewed and reveals the following: ?Past Medical History:  ?Diagnosis Date  ? Abnormal uterine bleeding   ? Abnormal uterine bleeding (AUB) 07/13/2013  ? Anxiety   ? Arthritis   ? hands, knees, shoulder, back   ? Asthma   ? Asthma in adult, mild persistent, uncomplicated 05/29/2018  ? Chest pain 06/27/2013  ? Colitis   ? History of  ? Complication of anesthesia 01/1992  ? During tubal ligation heart stopped and she stopped breathing  ? Depression   ? Environmental and seasonal allergies   ? Fatigue   ? Gallstones   ? GERD (gastroesophageal reflux disease)   ? Heart rate slow   ? per watch  ? History of kidney stones   ? History of kidney stones 05/29/2018  ? History of shingles   ? Hypoglycemia   ? IBS (irritable bowel syndrome)   ? Iron deficiency anemia   ? receive iron infusion  ? Lactose  intolerance   ? Lumbar herniated disc   ? per patient  ? Lupus ([facility])   ? Migraine   ? Migraine without aura and without status migrainosus, not intractable 07/26/2014  ? Migraine, unspecified, without mention of intractable migraine without mention of status migrainosus 09/28/2013  ? Numbness and tingling of both upper extremities   ? first thing in the morning when she wakes up  ? Numbness and tingling of lower extremity   ? first thing in the  morning when she wakes up  ? Osteoporosis   ? per patient, DEXA in 07/2019  ? RA (rheumatoid arthritis) ([facility])   ? Ventral hernia   ? Vertigo   ? ?Past Surgical History:  ?Procedure Laterality Date  ? APPENDECTOMY    ? CESAREAN SECTION    ? CHOLECYSTECTOMY [name] 08/07/2017  ? Procedure: LAPAROSCOPIC CHOLECYSTECTOMY, PRIMARY REPAIR OF UMBILICAL HERNIA;  Surgeon: Andria Meuse, MD;  Location: [facility];  Service: General;  Laterality: [name];  ? COLONOSCOPY    ? IUD REMOVAL [name] 11/12/2014  ? TUBAL LIGATION    ? UMBILICAL HERNIA REPAIR    ? ?Family Status  ?Relation Name Status  ? Mother  Alive  ? Father  Deceased at age 60  ? MGM  (Not Specified)  ? PGM  (Not Specified)  ? Son  Alive  ? Son  Alive  ? Daughter  Alive  ? Sister  Alive  ? Brother  Alive  ? Sister  Alive  ? Sister  Alive  ? Sister  Alive  ? Brother  Alive  ? Brother  Alive  ? ?Family History  ?Problem Relation Age of Onset  ? Suicidality Father   ? Diabetes Maternal Grandmother   ? Breast cancer Maternal Grandmother   ? Cervical cancer Paternal Grandmother   ? Heart defect Son   ? Healthy Son   ? Healthy Daughter   ? ?Social History  ? ?Socioeconomic History  ? Marital status: Single  ?  Spouse name: Not on file  ? Number of children: 3  ? Years of education: 9th  ? Highest education level: Not on file  ?Occupational History  ? Occupation: disabled  ?  Comment: disabled due to anxiety  ?Tobacco Use  ? Smoking status: Never  ? Smokeless tobacco: Never  ?Vaping Use  ? Vaping Use: Never used  ?Substance and Sexual Activity  ? Alcohol use: No  ?  Alcohol/week: 0.0 standard drinks  ? Drug use: No  ?  Comment: Quit in 1991  ? Sexual activity: Not Currently  ?  Partners: Male  ?  Birth control/protection: None, Surgical  ?  Comment: Last encounter 1 week ago  ?Other Topics Concern  ? Not on file  ?Social History Narrative  ? Patient lives at home alone and she is disabled.  ? Education 9th grade.  ? Right handed.  ? Caffeine one cup of coffee not daily.   ? ?Social  Determinants of Health  ? ?Financial Resource Strain: Not on file  ?Food Insecurity: Not on file  ?Transportation Needs: Not on file  ?Physical Activity: Not on file  ?Stress: Not on file  ?Social Connections: Not on file  ? ?Outpatient Medications Prior to Visit  ?Medication Sig  ? acetaminophen (TYLENOL) 325 MG tablet Take 650 mg by mouth every 6 (six) hours as needed.  ? alendronate (FOSAMAX) 70 MG tablet TAKE 1 TABLET BY MOUTH ONCE A WEEK. TAKE WITH A FULL GLASS OF WATER ON AN EMPTY  STOMACH.  ? EPINEPHRINE 0.3 mg/0.3 mL IJ SOAJ injection INJECT 0.3 MLS (0.3 MG TOTAL) INTO THE MUSCLE AS NEEDED FOR ANAPHYLAXIS.  ? hydroxychloroquine (PLAQUENIL) 200 MG tablet Take  by mouth twice daily, Monday through Friday only. None on Saturday or Sunday.  ? ibuprofen (ADVIL) 800 MG tablet Take 1 tablet (800 mg total) by mouth every 8 (eight) hours as needed. Take after a meal.  ? Multiple Vitamin (MULTIVITAMIN PO) Take by mouth daily.  ? ondansetron (ZOFRAN ODT) 4 MG disintegrating tablet Take 1 tablet (4 mg total) by mouth every 8 (eight) hours as needed for nausea or vomiting.  ? Vitamin D, Ergocalciferol, (DRISDOL) 1.25 MG (50000 UNIT) CAPS capsule TAKE 1 CAPSULE BY MOUTH EVERY 7 DAYS FOR 6 WEEKS.  ? Vitamin D, Ergocalciferol, (DRISDOL) 1.25 MG (50000 UNIT) CAPS capsule Take 1 capsule (50,000 Units total) by mouth every 7 (seven) days.  ? [DISCONTINUED] albuterol (VENTOLIN HFA) 108 (90 Base) MCG/ACT inhaler INHALE 1 PUFF INTO THE LUNGS AS NEEDED FOR WHEEZING OR SHORTNESS OF BREATH.  ? [DISCONTINUED] fluticasone (FLONASE) 50 MCG/ACT nasal spray PLACE 1 SPRAY INTO BOTH NOSTRILS DAILY.  ? [DISCONTINUED] loratadine (CLARITIN) 10 MG tablet TAKE 1 TABLET BY MOUTH EVERY DAY  ? Prenatal Vit-Fe Phos-FA-Omega (VITAFOL GUMMIES) 3.33-0.333-34.8 MG CHEW CHEW 3 TABLETS BY MOUTH DAILY BEFORE BREAKFAST. (Patient not taking: Reported on 07/26/2021)  ? [DISCONTINUED] Doxylamine Succinate, Sleep, (SLEEP AID PO) Take by mouth at bedtime.  (Patient not taking: Reported on 07/26/2021)  ? ?No facility-administered medications prior to visit.  ? ?Allergies  ?Allergen Reactions  ? Anesthetics, [place] Anaphylaxis and Other (See Comments)  ?  Heart sto

## 2021-08-28 ENCOUNTER — Telehealth (HOSPITAL_BASED_OUTPATIENT_CLINIC_OR_DEPARTMENT_OTHER): Payer: Self-pay

## 2021-08-28 NOTE — Telephone Encounter (Signed)
Received fax from [employer] that patient was approved for Mometasone furoate spray 08/27/2021-08/27/2022 auth # is [id number] ?

## 2021-08-29 ENCOUNTER — Ambulatory Visit: Payer: Medicaid Other | Admitting: Neurology

## 2021-09-01 ENCOUNTER — Encounter (HOSPITAL_BASED_OUTPATIENT_CLINIC_OR_DEPARTMENT_OTHER): Payer: Self-pay | Admitting: Nurse Practitioner

## 2021-09-01 ENCOUNTER — Telehealth (HOSPITAL_BASED_OUTPATIENT_CLINIC_OR_DEPARTMENT_OTHER): Payer: Medicaid Other | Admitting: Nurse Practitioner

## 2021-09-01 DIAGNOSIS — J45909 Unspecified asthma, uncomplicated: Secondary | ICD-10-CM | POA: Insufficient documentation

## 2021-09-01 DIAGNOSIS — G4700 Insomnia, unspecified: Secondary | ICD-10-CM | POA: Insufficient documentation

## 2021-09-01 DIAGNOSIS — M3219 Other organ or system involvement in systemic lupus erythematosus: Secondary | ICD-10-CM | POA: Insufficient documentation

## 2021-09-01 NOTE — Assessment & Plan Note (Deleted)
Chronic.  Recent exacerbation of symptoms related to seasonal allergies not well controlled.  We will refill albuterol today.  Recommend increased monitoring and if using more than 3 times a day or if awakening at night with symptoms recommend contacting the office for consideration of addition of steroid allergy medication.  Discussed the importance of controlling allergies with patient today.  We will add Xyzal and mometasone intranasal spray to see if we can get improved control of her allergies to help reduce exacerbation of her asthma.  No alarm symptoms present today.  Patient will monitor closely and follow-up if symptoms worsen or fail to improve with current treatment measures. ?

## 2021-09-01 NOTE — Assessment & Plan Note (Signed)
Chronic.  Recent exacerbation of symptoms related to seasonal allergies not well controlled.  We will refill albuterol today.  Recommend increased monitoring and if using more than 3 times a day or if awakening at night with symptoms recommend contacting the office for consideration of addition of steroid allergy medication.  Discussed the importance of controlling allergies with patient today.  We will add Xyzal and mometasone intranasal spray to see if we can get improved control of her allergies to help reduce exacerbation of her asthma.  No alarm symptoms present today.  Patient will monitor closely and follow-up if symptoms worsen or fail to improve with current treatment measures. ?

## 2021-09-01 NOTE — Assessment & Plan Note (Signed)
Seasonal allergies not well controlled at this time.  She is unable to take Flonase as this is too drying.  She is having exacerbation of her asthma and vertigo related to increased allergy symptoms.  Recommend starting Xyzal at bedtime and add mometasone intranasal spray to help improve control of allergy symptoms.  Discussed with patient the importance of monitoring for new or worsening symptoms or side effects of medication.  We will follow-up if symptoms worsen or fail to improve.  May need referral to allergy and asthma for improved control. ?

## 2021-09-01 NOTE — Assessment & Plan Note (Signed)
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  Currently on Plaquenil and has been on this medication on and off long-term.  We will happily monitor labs for patient however if symptoms appear to worsen or become less controlled will need to send to rheumatology for further evaluation and recommendations on management.  No alarm symptoms present today.  We will continue to monitor ?

## 2021-09-01 NOTE — Assessment & Plan Note (Signed)
Symptoms and presentation consistent with BPPV.  This is most likely related to current exacerbation of her allergy symptoms.  Discussed with patient the Epley maneuver which can be done at home to help reduce symptoms as well as the importance of controlling allergy symptoms to reduce the increased pressure and fluid in the inner ears.  Patient provided with a handout on Epley maneuver.  We will also send treatment with meclizine to be used as needed to help with dizziness.  No alarm symptoms present today.  She is able to move all extremities without any signs of weakness or deficit present.  Patient is aware of symptoms to monitor for that would warrant immediate evaluation.  If symptoms do not improve with at home treatments will consider physical therapy referral for maneuvers that may be helpful for resolution. ?

## 2021-09-01 NOTE — Assessment & Plan Note (Signed)
Symptoms of insomnia not well controlled at this time.  Possibly related to pain from SLE or unknown etiology.  Discussed with patient the use of cytosol for management of her allergy exacerbations which can also be sedating and may help with sleep.  She is in agreement to try this at this time.  If symptoms worsen or fail to improve we will consider further evaluation.  Patient does have significant allergies to multiple medications therefore any new medications started need to be monitored closely. ?Patient will follow-up as needed ?

## 2021-09-04 ENCOUNTER — Ambulatory Visit (INDEPENDENT_AMBULATORY_CARE_PROVIDER_SITE_OTHER): Payer: Medicaid Other | Admitting: Nurse Practitioner

## 2021-09-04 NOTE — Progress Notes (Signed)
Unable to reach patient for appt via telephone. VM left for patient to call back.  ?

## 2021-09-11 ENCOUNTER — Ambulatory Visit (INDEPENDENT_AMBULATORY_CARE_PROVIDER_SITE_OTHER): Payer: Medicaid Other | Admitting: Nurse Practitioner

## 2021-09-11 ENCOUNTER — Encounter (HOSPITAL_BASED_OUTPATIENT_CLINIC_OR_DEPARTMENT_OTHER): Payer: Self-pay | Admitting: Nurse Practitioner

## 2021-09-11 DIAGNOSIS — M5030 Other cervical disc degeneration, unspecified cervical region: Secondary | ICD-10-CM | POA: Diagnosis not present

## 2021-09-11 DIAGNOSIS — R42 Dizziness and giddiness: Secondary | ICD-10-CM | POA: Diagnosis not present

## 2021-09-11 DIAGNOSIS — G4700 Insomnia, unspecified: Secondary | ICD-10-CM

## 2021-09-11 DIAGNOSIS — M3219 Other organ or system involvement in systemic lupus erythematosus: Secondary | ICD-10-CM

## 2021-09-11 DIAGNOSIS — J45909 Unspecified asthma, uncomplicated: Secondary | ICD-10-CM

## 2021-09-11 DIAGNOSIS — M419 Scoliosis, unspecified: Secondary | ICD-10-CM

## 2021-09-11 DIAGNOSIS — J302 Other seasonal allergic rhinitis: Secondary | ICD-10-CM

## 2021-09-11 NOTE — Assessment & Plan Note (Signed)
Asthma control improved with change in allergy medication. No alarm sx or exacerbations not controlled with albuterol at this time. Recommend continue allergy medications daily and PRN use of albuterol. If finding that you are using the inhaler more than 4 times a day on routine basis, please contact the office. Will plan for pulmonology referral if needed.  ?

## 2021-09-11 NOTE — Assessment & Plan Note (Signed)
Hx of DDD with new occurrence of neck and shoulder pain and vertigo. Patient endorses spasms and worsening vertigo symptoms with certain movements of the neck (Epley maneuver).  ?She also has hx of scoliosis.  ?Recommend seeing spinal specialist to see if she is eligible for treatments that do not require oral steroids for treatment as she does have a history of frequent steroid use and subsequent osteoporosis.  ?Will send referral today.  ?

## 2021-09-11 NOTE — Assessment & Plan Note (Signed)
Insomnia with significant daytime fatigue, frequent night time awakening with gasping for air and periods of bradycardia. Symptoms correlate with OSA. Discussed this with patient today. I do feel that she would greatly benefit from a sleep study to determine if the symptoms are caused from apneic episodes and if intervention would help improve the daytime and nighttime symptoms as well as QOL.  ?She has an appt with Dr. Everlena Cooper on 05/08 and I recommend she discuss the option of sleep study with him to see if he can get her in sooner that I could with regular referral.  ?Will follow ?

## 2021-09-11 NOTE — Assessment & Plan Note (Signed)
Symptoms of vertigo improved with meclizine. Epley maneuvers are causing increased dizziness and pain the neck and left shoulder. Unclear if the dizziness is related to pinched nerve or other consideration. She does not have any alarm symptoms at this time.  ?I am hesitant to consider oral steroids trial at this time due to increased steroid use in the past for lupus and presence of osteoporosis. She may benefit from joint injection vs. Further evaluation with spinal specialist and neurology. Will plan to send referral today.  ?Continue meclizine at this time. Will follow.  ?

## 2021-09-11 NOTE — Assessment & Plan Note (Signed)
At this time feel that symptoms of fatigue are not related to lupus entirely, but may correlate more with OSA. She is scheduled to see Rheumatology in the next week and neurology- recommend discussion of symptoms and change in management as appropriate based on their findings. Will not make any changes at this time.  ?

## 2021-09-11 NOTE — Assessment & Plan Note (Signed)
Improved control with additional of xyzal and mometasone nasal spray. Recommend continue daily use and monitor.  ?

## 2021-09-11 NOTE — Progress Notes (Signed)
Virtual Visit Encounter telephone visit. ?Patient called at 10:15 for intake. No answer. VM left for patient to call back.  ? ?I connected with  Danielle Holden on 09/11/21 at 10:50 AM EDT by secure audio telemedicine application. I verified that I am speaking with the correct person using two identifiers. ?  ?I introduced myself as a Publishing rights manager with the practice. The limitations of evaluation and management by telemedicine discussed with the patient and the availability of in person appointments. The patient expressed verbal understanding and consent to proceed. ? ?Participating parties in this visit include: Myself and patient ? ?The patient is: Patient Location: Home ?I am: Provider Location: Office/Clinic ?Subjective:   ? ?CC and HPI: Danielle Holden is a 51 y.o. year old female presenting for follow up of Vertigo and  ?Seasonal Allergies and Asthma.  ?She was last seen 04/14 for these concerns.  ? ?Asthma and Allergies ?At that time she was started on xyzal and mometasone nasal spray for allergy symptoms. Refills on albuterol also provided.  ?She tells me that she has had improvement with her symptoms with the xyzal and mometasone.  ? ? ?Sleep Issues ?She is also having severe fatigue, brain fog, and trouble finding her words sometimes.  ?She has been waking up coughing and catching her breath in the middle of the night- this has been going on for a while, but has gotten worse in the last six months. She has also noticed that her HR will drop down into the upper 40's when these events occur.  ?She has never had a sleep study done, but she does feel like her symptoms correlated with this.  ?She has an appointment with Dr. Everlena Cooper on the 8th of this month.  ? ?Vertigo ?Last visit she was provided with meclizine for treatment and recommend Epley maneuvers for symptoms.  ?Today she tells me the vertigo is not as bad with the medication.  ?She does tell me that the medication makes her drowsy.  ?She  reports that the maneuvers did not help.  ?She thinks that she may have a pinched nerve that could be causing her symptoms.  ?When she was doing the exercises, the next day she felt pain in her neck and into her shoulder that also correlates with dizziness.  ?She tells me she can feel the nerve causing a spasms from the neck into the left arm. She is having some weakness in that left arm with the spasms.  ?She reports that she does have a history of scoliosis and DDD with bulging at L5-S1.  ?She has not seen a spinal specialist for this issue.  ?She is not having any headache, vision changes, slurred speech, weakness in her extremities (other than left arm with spasms) ?No CP, ShOB ? ?Past medical history, Surgical history, Family history not pertinant except as noted below, Social history, Allergies, and medications have been entered into the medical record, reviewed, and corrections made.  ? ?Review of Systems:  ?All review of systems negative except what is listed in the HPI ? ?Objective:   ? ?Alert and oriented x 4 ?Speaking in clear sentences with no shortness of breath. ?No distress. ? ?Impression and Recommendations:   ? ?Problem List Items Addressed This Visit   ?None ? ? ?orders and follow up as documented in EMR ?I discussed the assessment and treatment plan with the patient. The patient was provided an opportunity to ask questions and all were answered. The patient agreed with the plan  and demonstrated an understanding of the instructions. ?  ?The patient was advised to call back or seek an in-person evaluation if the symptoms worsen or if the condition fails to improve as anticipated. ? ?Follow-Up: TBD based on specialist ? ?I provided 41 minutes of non-face-to-face interaction with this non face-to-face encounter including intake, same-day documentation, and chart review.  ? ?Tollie EthSara E. Sharyah Bostwick, NP , DNP, AGNP-c ?[facility] ?Primary Care & Sports Medicine at [facility]  [place] ?[phone] ?[phone] (fax) ? ?

## 2021-09-11 NOTE — Assessment & Plan Note (Signed)
See DDD ?

## 2021-09-14 NOTE — Progress Notes (Addendum)
? ?NEUROLOGY FOLLOW UP OFFICE NOTE ? ?Danielle Holden [place] ?[id number] ? ?Assessment/Plan:  ? ?1.  Migraine without aura, without status migrainosus, not intractable ?2.  Migrainous vertigo ?  ?1.  Stop ibuprofen.  Limit use of pain relievers to no more than 2 days out of week to prevent risk of rebound or medication-overuse headache. ?2.  Start Aimovig 140mg  every 28 days. ?3.  For rescue, she will try Nurtec.   ?4.  Ondansetron for nausea ?5.  Keep headache diary ?6.  Follow up in 6 months. ?  ?  ?Subjective:  ?Danielle Holden is a 51 year old female who follows up for migraines. ? ?UPDATE: ?Last seen almost a year ago.  At that time, plan was to start [place].  There was an issue with the insurance so she never started it.  She tried Ubrelvy samples but caused nausea. ?Intensity:  moderate to sevee ?Duration:  all day ?Frequency:  has a dull underlying almost daily headache but migraine headaches occur 14-15 days a month. ?Frequency of abortive medication: ibuprofen daily ?Frequency of analgesics:   ?Current NSAIDS/analgesics:  ibuprofen 200mg  ?Current triptans:  none ?Current ergotamine:  none ?Current anti-emetic:  zofran ?Current muscle relaxants:  none ?Current Antihypertensive medications:  none ?Current Antidepressant medications:  none ?Current Anticonvulsant medications:  none ?Current anti-CGRP:  none ?Current Vitamins/Herbal/Supplements:  MVI, D, prenatal, melatonin ?Current Antihistamines/Decongestants:  Flonase, Claritin, meclizine ?Other therapy:  none ?Hormone/birth control:  none ? ?Caffeine:  1 cup of diluted coffee daily. No soda ?Diet:  Drinks 3-4 16 oz bottles of water daily.  Lactose intolerant ?Exercise:  Walking ?Depression:  no; Anxiety:  yes ?Other pain:  She has chronic pain with lupus Other autoimmune labs, including sed rate, were unremarkable. ?Sleep hygiene:  Poor.  Trouble staying asleep.  3 to 4 hours a night.  Just started melatonin ? ?HISTORY:  ?She started getting  migraines at 51 years old.  Sometimes they are mild but other times may be severe.  Either temple, bilateral occipital, bifrontal, neck, pounding.  Associated blurred vision and spots, otalgia, vertigo, slurred speech, nausea, photophobia, phonophobia, osmophobia, face flushed.  Usually lays down to rest in dark room and may last 1-2 hours.  If she cannot rest, 3-4 hours.  Occurs every other day.  Triggers included seasonal allergies, loud noise and caffeine withdrawal.  Treats with Tylenol. ?  ?Has lupus with chronic pain syndrome.  Autoimmune workup otherwise unremarkable.  She has chronic unexplained right sided numbness of face, arm and leg previously worked up.  MRI of brain with and without contrast and MRA of head on 06/28/2013 to evaluate, were personally reviewed, and were unremarkable. ? ?  ?Past NSAIDS/analgesics:  Norco, naproxen, tramadol, Tylenol ?Past abortive triptans:  Rizatriptan, sumatriptan tab ?Past abortive ergotamine:  none ?Past muscle relaxants:  Flexeril ?Past anti-emetic:  Zofran ?Past antihypertensive medications:  propranolol ?Past antidepressant medications:  venlafaxine ?Past anticonvulsant medications:  topiramate ?Past anti-CGRP:  Roselyn Meier ?Past vitamins/Herbal/Supplements:  none ?Past antihistamines/decongestants:  Benadryl ?Other past therapies:  none ?  ? ?Family history of headache:  unknown ? ?PAST MEDICAL HISTORY: ?Past Medical History:  ?Diagnosis Date  ? Abnormal uterine bleeding   ? Abnormal uterine bleeding (AUB) 07/13/2013  ? Anxiety   ? Arthritis   ? hands, knees, shoulder, back   ? Asthma   ? Asthma in adult, mild persistent, uncomplicated [id number]  ? Chest pain 06/27/2013  ? Colitis   ? History of  ? Complication of anesthesia  01/1992  ? During tubal ligation heart stopped and she stopped breathing  ? Depression   ? Environmental and seasonal allergies   ? Fatigue   ? Gallstones   ? GERD (gastroesophageal reflux disease)   ? Heart rate slow   ? per watch  ? History of  kidney stones   ? History of kidney stones 05/29/2018  ? History of shingles   ? Hypoglycemia   ? IBS (irritable bowel syndrome)   ? Iron deficiency anemia   ? receive iron infusion  ? Lactose intolerance   ? Lumbar herniated disc   ? per patient  ? Lupus ([place])   ? Migraine   ? Migraine without aura and without status migrainosus, not intractable 07/26/2014  ? Migraine, unspecified, without mention of intractable migraine without mention of status migrainosus 09/28/2013  ? Numbness and tingling of both upper extremities   ? first thing in the morning when she wakes up  ? Numbness and tingling of lower extremity   ? first thing in the morning when she wakes up  ? Osteoporosis   ? per patient, DEXA in 07/2019  ? RA (rheumatoid arthritis) ([place])   ? Ventral hernia   ? Vertigo   ? ? ?MEDICATIONS: ?Current Outpatient Medications on File Prior to Visit  ?Medication Sig Dispense Refill  ? acetaminophen (TYLENOL) 325 MG tablet Take 650 mg by mouth every 6 (six) hours as needed.    ? albuterol (VENTOLIN HFA) 108 (90 Base) MCG/ACT inhaler Inhale 1-2 puffs into the lungs every 4 (four) hours as needed for wheezing. 2 each 11  ? alendronate (FOSAMAX) 70 MG tablet TAKE 1 TABLET BY MOUTH ONCE A WEEK. TAKE WITH A FULL GLASS OF WATER ON AN EMPTY STOMACH. 12 tablet 0  ? EPINEPHRINE 0.3 mg/0.3 mL IJ SOAJ injection INJECT 0.3 MLS (0.3 MG TOTAL) INTO THE MUSCLE AS NEEDED FOR ANAPHYLAXIS. 2 each 2  ? hydroxychloroquine (PLAQUENIL) 200 MG tablet Take 200mg  by mouth twice daily, Monday through Friday only. None on Saturday or Sunday. 120 tablet 0  ? ibuprofen (ADVIL) 800 MG tablet Take 1 tablet (800 mg total) by mouth every 8 (eight) hours as needed. Take after a meal. 30 tablet 5  ? levocetirizine (XYZAL) 5 MG tablet Take 1 tablet (5 mg total) by mouth every evening. 30 tablet 6  ? meclizine (ANTIVERT) 25 MG tablet Take 1 tablet (25 mg total) by mouth 3 (three) times daily as needed for dizziness (VERTIGO). 30 tablet 11  ? mometasone  (NASONEX) 50 MCG/ACT nasal spray One spray in each nostril twice a day, use left hand for right nostril, and right hand for left nostril.  Please dispense one bottle. 1 g 6  ? Multiple Vitamin (MULTIVITAMIN PO) Take by mouth daily.    ? ondansetron (ZOFRAN ODT) 4 MG disintegrating tablet Take 1 tablet (4 mg total) by mouth every 8 (eight) hours as needed for nausea or vomiting. 20 tablet 5  ? Prenatal Vit-Fe Phos-FA-Omega (VITAFOL GUMMIES) 3.33-0.333-34.8 MG CHEW CHEW 3 TABLETS BY MOUTH DAILY BEFORE BREAKFAST. (Patient not taking: Reported on 07/26/2021) 90 tablet 4  ? Vitamin D, Ergocalciferol, (DRISDOL) 1.25 MG (50000 UNIT) CAPS capsule TAKE 1 CAPSULE BY MOUTH EVERY 7 DAYS FOR 6 WEEKS. 6 capsule 0  ? Vitamin D, Ergocalciferol, (DRISDOL) 1.25 MG (50000 UNIT) CAPS capsule Take 1 capsule (50,000 Units total) by mouth every 7 (seven) days. 5 capsule 0  ? ?No current facility-administered medications on file prior to visit.  ? ? ?  ALLERGIES: ?Allergies  ?Allergen Reactions  ? Anesthetics, Amide Anaphylaxis and Other (See Comments)  ?  Heart stopped and stopped breathing  ? Benzocaine Anaphylaxis  ? Black Pepper [[place]] Anaphylaxis  ?  Throat swelling  ? Mushroom Extract Complex Anaphylaxis  ?  Mouth swelling  ? Trazodone And Nefazodone Anaphylaxis  ?  Tongue swells.  ? Amoxicillin Hives and Diarrhea  ?  Has patient had a PCN reaction causing immediate rash, facial/tongue/throat swelling, SOB or lightheadedness with hypotension: No ?Has patient had a PCN reaction causing severe rash involving mucus membranes or skin necrosis: Yes ?Has patient had a PCN reaction that required hospitalization: Yes ?Has patient had a PCN reaction occurring within the last 10 years: Yes ?If all of the above answers are "NO", then may proceed with Cephalosporin use. ?  ? Bee Venom Swelling  ? Benadryl [Diphenhydramine]   ?  Heavy periods   ? Effexor [Venlafaxine] Hives  ? Lactose Intolerance ([place])   ?  Stomach pain  ? Nortriptyline Hives  ?  Nsaids   ?  Make colitis worse  ? Other Diarrhea  ?  Red meat - stomach pains, bleeding   ? ? ?FAMILY HISTORY: ?Family History  ?Problem Relation Age of Onset  ? Suicidality Father   ? Diabetes Maternal Grandmother   ? B

## 2021-09-18 ENCOUNTER — Telehealth (HOSPITAL_COMMUNITY): Payer: Self-pay

## 2021-09-18 ENCOUNTER — Encounter: Payer: Self-pay | Admitting: Neurology

## 2021-09-18 ENCOUNTER — Ambulatory Visit: Payer: Medicaid Other | Admitting: Neurology

## 2021-09-18 ENCOUNTER — Other Ambulatory Visit (HOSPITAL_COMMUNITY): Payer: Self-pay

## 2021-09-18 VITALS — BP 114/74 | HR 63 | Resp 20 | Ht 63.5 in | Wt 160.0 lb

## 2021-09-18 DIAGNOSIS — G43709 Chronic migraine without aura, not intractable, without status migrainosus: Secondary | ICD-10-CM | POA: Diagnosis not present

## 2021-09-18 DIAGNOSIS — G43109 Migraine with aura, not intractable, without status migrainosus: Secondary | ICD-10-CM

## 2021-09-18 MED ORDER — AIMOVIG 140 MG/ML ~~LOC~~ SOAJ: 140.0000 mg | SUBCUTANEOUS | 5 refills | Status: DC

## 2021-09-18 MED ORDER — NURTEC 75 MG PO TBDP: 75.0000 mg | ORAL_TABLET | Freq: Every day | ORAL | 5 refills | Status: DC | PRN

## 2021-09-18 NOTE — Telephone Encounter (Signed)
Patient Advocate Encounter ?  ?Received notification that prior authorization for Nurtec 75MG  dispersible tablets is required. ?  ?PA submitted on 09/18/2021 ?Key [id number] ?Status is pending ?   ? ? ? ? ?

## 2021-09-18 NOTE — Patient Instructions (Signed)
Start Aimovig 140mg  every 28 days ?At earliest onset of migraine, use Nurtec once daly as needed. ?Stop Advil ?Limit use of pain relievers to no more than 2 days out of week to prevent risk of rebound or medication-overuse headache. ?Keep headache diary ?Follow up 6 months. ?

## 2021-09-19 ENCOUNTER — Other Ambulatory Visit: Payer: Self-pay | Admitting: Neurology

## 2021-09-19 NOTE — Telephone Encounter (Signed)
Prior Authorization for Nurtec 75MG  dispersible has been denied as insurance requires medical record stating that patient has 15 or less headache days per month. Do you wish to provide updated chartnotes reflecting this? ? ? ? ?

## 2021-09-20 NOTE — Telephone Encounter (Signed)
Appeal for Nurtec 75MG  dispersible  has been submitted 09/20/21 ? ?

## 2021-09-21 ENCOUNTER — Telehealth: Payer: Self-pay | Admitting: Neurology

## 2021-09-21 NOTE — Telephone Encounter (Signed)
Pt called in stating her pharmacy told her that her insurance will not cover the Nurtec. She is not sure what she should do. ?

## 2021-09-21 NOTE — Telephone Encounter (Signed)
Patient advised Appeal sent.  ?

## 2021-09-21 NOTE — Telephone Encounter (Signed)
Pt okay to come pick up samples until we get the appeal done.  ?

## 2021-09-22 NOTE — Telephone Encounter (Signed)
Received a Fax from insurance needing a consent to appeal from patient.  Faxed in consent to insurance on 09/22/2021. ? ?Roland Earl, CPHT ?Pharmacy Patient Advocate Specialist ?[facility] Patient Advocate Team ?Direct Number: [phone]  Fax: [phone] ? ?  ?

## 2021-09-22 NOTE — Telephone Encounter (Signed)
Medication Samples have been provided to the patient. ? ?Drug name: Nurtec       Strength: 75 mg        Qty: 4  LOT: [id number]  Exp.Date: 2/25 ? ?Dosing instructions: as needed ? ?The patient has been instructed regarding the correct time, dose, and frequency of taking this medication, including desired effects and most common side effects.  ? ?Danielle Holden ?3:21 PM ?09/22/2021  ?

## 2021-10-03 ENCOUNTER — Encounter (HOSPITAL_BASED_OUTPATIENT_CLINIC_OR_DEPARTMENT_OTHER): Payer: Self-pay | Admitting: Nurse Practitioner

## 2021-10-17 ENCOUNTER — Encounter: Payer: Self-pay | Admitting: Nurse Practitioner

## 2021-10-17 ENCOUNTER — Other Ambulatory Visit (HOSPITAL_COMMUNITY): Payer: Self-pay

## 2021-11-02 ENCOUNTER — Other Ambulatory Visit (HOSPITAL_COMMUNITY): Payer: Self-pay

## 2021-11-15 ENCOUNTER — Telehealth (HOSPITAL_COMMUNITY): Payer: Self-pay | Admitting: Pharmacy Technician

## 2021-11-15 ENCOUNTER — Other Ambulatory Visit (HOSPITAL_COMMUNITY): Payer: Self-pay

## 2021-11-15 NOTE — Telephone Encounter (Signed)
Patient Advocate Encounter   Received notification that prior authorization for Nurtec 75MG  dispersible tablets is required.   PA submitted on 11/15/2021 Key [id number] Status is pending       01/16/2022, CPhT Pharmacy Patient Advocate Specialist [facility] Patient Advocate Team Direct Number: [phone]  Fax: [phone]

## 2021-11-15 NOTE — Telephone Encounter (Signed)
Patient Advocate Encounter  Prior Authorization for Nurtec 75MG  dispersible tablets has been approved.    PA# Effective dates: 11/15/2021 through 074/08/2022      10/16/2022, CPhT Pharmacy Patient Advocate Specialist [facility] Patient Advocate Team Direct Number: [phone]  Fax: [phone]

## 2021-12-12 ENCOUNTER — Ambulatory Visit (HOSPITAL_BASED_OUTPATIENT_CLINIC_OR_DEPARTMENT_OTHER): Payer: Medicaid Other | Admitting: Family Medicine

## 2021-12-12 ENCOUNTER — Encounter (HOSPITAL_BASED_OUTPATIENT_CLINIC_OR_DEPARTMENT_OTHER): Payer: Self-pay | Admitting: Family Medicine

## 2021-12-12 DIAGNOSIS — M222X1 Patellofemoral disorders, right knee: Secondary | ICD-10-CM | POA: Insufficient documentation

## 2021-12-12 NOTE — Progress Notes (Signed)
    Procedures performed today:    None.  Independent interpretation of notes and tests performed by another provider:   None.  Brief History, Exam, Impression, and Recommendations:    BP 113/70   Pulse (!) 55   Ht 5' 3.5" (1.613 m)   Wt 159 lb 12.8 oz (72.5 kg)   LMP 07/20/2018 (Exact Date)   SpO2 100%   BMI 27.86 kg/m   Patellofemoral pain syndrome of right knee Patient presents for evaluation of right knee pain.  Has long history of right knee pain, however reports that current symptoms have been present for about 3 weeks.  Has been having some associated swelling, most notable towards the end of the day.  Pain primarily along the anterior joint line.  She does have stairs where she lives and so taking her dog out a few times a day will make things worse as stairs can aggravate her symptoms.  She does feel that going downstairs is possibly worsening going up stairs.  Has not noted any instability.  Does report some cracking/popping. On exam:  Right knee: No obvious deformity. Small effusion.  Positive patellar grind.  Positive crepitus. Full ROM for flexion and extension.  Strength 5 out of 5 for flexion and extension. Anterior drawer: Negative Posterior drawer: Negative Lachman: Negative Varus stress test: Negative Valgus stress test: Negative McMurray's: Negative Neurovascularly intact.  No evidence of lymphatic disease.  Suspect current symptoms are most likely related to patellofemoral pain syndrome.  Discussed with patient, provided handout as well as home exercises Recommend referral to physical therapy, home exercise program as per PT Can proceed with x-ray imaging given acute on chronic nature of right knee pain, prior x-rays in 2020 were generally reassuring Plan for follow-up as needed, particularly if symptoms are not improving as expected or if specific abnormality observed on x-ray imaging  Return in about 6 weeks (around 01/23/2022) for follow-up with PCP.   Will have patient arrange for follow-up with her PCP.  Follow-up with me as needed, consider use of reaction knee brace if continuing to have symptoms   ___________________________________________ Grizelda Piscopo de [place], MD, ABFM, CAQSM Primary Care and Sports Medicine [facility]

## 2021-12-12 NOTE — Assessment & Plan Note (Signed)
Patient presents for evaluation of right knee pain.  Has long history of right knee pain, however reports that current symptoms have been present for about 3 weeks.  Has been having some associated swelling, most notable towards the end of the day.  Pain primarily along the anterior joint line.  She does have stairs where she lives and so taking her dog out a few times a day will make things worse as stairs can aggravate her symptoms.  She does feel that going downstairs is possibly worsening going up stairs.  Has not noted any instability.  Does report some cracking/popping. On exam:  Right knee: No obvious deformity. Small effusion.  Positive patellar grind.  Positive crepitus. Full ROM for flexion and extension.  Strength 5 out of 5 for flexion and extension. Anterior drawer: Negative Posterior drawer: Negative Lachman: Negative Varus stress test: Negative Valgus stress test: Negative McMurray's: Negative Neurovascularly intact.  No evidence of lymphatic disease.  Suspect current symptoms are most likely related to patellofemoral pain syndrome.  Discussed with patient, provided handout as well as home exercises Recommend referral to physical therapy, home exercise program as per PT Can proceed with x-ray imaging given acute on chronic nature of right knee pain, prior x-rays in 2020 were generally reassuring Plan for follow-up as needed, particularly if symptoms are not improving as expected or if specific abnormality observed on x-ray imaging

## 2021-12-12 NOTE — Patient Instructions (Signed)
Dr. De Peru would like you to come back for your X-ray tomorrow.  You have been referred to physical therapy downstairs. They should call you to schedule this.  Follow-up with Dr. De Peru as needed.

## 2021-12-13 ENCOUNTER — Ambulatory Visit (INDEPENDENT_AMBULATORY_CARE_PROVIDER_SITE_OTHER): Payer: Medicaid Other

## 2021-12-13 DIAGNOSIS — M25561 Pain in right knee: Secondary | ICD-10-CM | POA: Diagnosis not present

## 2021-12-13 DIAGNOSIS — M222X1 Patellofemoral disorders, right knee: Secondary | ICD-10-CM | POA: Diagnosis not present

## 2021-12-14 ENCOUNTER — Other Ambulatory Visit (HOSPITAL_COMMUNITY): Payer: Self-pay

## 2021-12-14 ENCOUNTER — Telehealth (HOSPITAL_COMMUNITY): Payer: Self-pay | Admitting: Pharmacy Technician

## 2021-12-14 NOTE — Telephone Encounter (Signed)
Patient Advocate Encounter  Prior Authorization for Aimovig 140MG /ML auto-injectors  has been approved.    PA# Effective dates: 12/14/2021 through 03/14/2022     13/05/2021, CPhT Pharmacy Patient Advocate Specialist [facility] Patient Advocate Team Direct Number: [phone]  Fax: [phone]

## 2021-12-14 NOTE — Telephone Encounter (Signed)
Patient Advocate Encounter   Received notification that prior authorization for Aimovig 140MG /ML auto-injectors is required.   PA submitted on 12/14/2021 Key 02/13/2022 Status is pending       [id number], CPhT Pharmacy Patient Advocate Specialist [facility] Patient Advocate Team Direct Number: [phone]  Fax: [phone]

## 2021-12-25 ENCOUNTER — Other Ambulatory Visit (HOSPITAL_COMMUNITY): Payer: Self-pay

## 2022-01-10 ENCOUNTER — Ambulatory Visit (HOSPITAL_BASED_OUTPATIENT_CLINIC_OR_DEPARTMENT_OTHER): Payer: Medicaid Other | Admitting: Physical Therapy

## 2022-01-23 ENCOUNTER — Ambulatory Visit (HOSPITAL_BASED_OUTPATIENT_CLINIC_OR_DEPARTMENT_OTHER): Payer: Medicaid Other | Admitting: Nurse Practitioner

## 2022-02-14 ENCOUNTER — Telehealth: Payer: Self-pay | Admitting: Pharmacy Technician

## 2022-02-14 NOTE — Telephone Encounter (Signed)
Submitted a Prior Authorization request to [place] for  Aimovig 140mg   via CoverMyMeds. Will update once we receive a response.   KeyDewayne Hatch - PA Case ID: [id number]

## 2022-02-14 NOTE — Telephone Encounter (Signed)
Received notification from [place] regarding a prior authorization for  Aimovig . Authorization has been APPROVED from 02/14/22 to 02/14/23.   Authorization # Key: [id number] - PA Case ID: [id number]

## 2022-03-07 ENCOUNTER — Encounter: Payer: Self-pay | Admitting: Obstetrics

## 2022-03-07 ENCOUNTER — Other Ambulatory Visit: Payer: Self-pay | Admitting: Obstetrics

## 2022-03-07 ENCOUNTER — Ambulatory Visit: Payer: Medicaid Other | Admitting: Obstetrics

## 2022-03-07 VITALS — BP 136/81 | HR 54 | Ht 63.0 in | Wt 162.7 lb

## 2022-03-07 DIAGNOSIS — R300 Dysuria: Secondary | ICD-10-CM | POA: Diagnosis not present

## 2022-03-07 DIAGNOSIS — M810 Age-related osteoporosis without current pathological fracture: Secondary | ICD-10-CM

## 2022-03-07 DIAGNOSIS — E569 Vitamin deficiency, unspecified: Secondary | ICD-10-CM | POA: Diagnosis not present

## 2022-03-07 DIAGNOSIS — R102 Pelvic and perineal pain: Secondary | ICD-10-CM | POA: Diagnosis not present

## 2022-03-07 DIAGNOSIS — Z1239 Encounter for other screening for malignant neoplasm of breast: Secondary | ICD-10-CM | POA: Diagnosis not present

## 2022-03-07 MED ORDER — VITAMIN D (ERGOCALCIFEROL) 1.25 MG (50000 UNIT) PO CAPS: 50000.0000 [IU] | ORAL_CAPSULE | ORAL | 0 refills | Status: DC

## 2022-03-07 MED ORDER — PNV-DHA+DOCUSATE 27-1.25-300 MG PO CAPS: 1.0000 | ORAL_CAPSULE | Freq: Every day | ORAL | 4 refills | Status: DC

## 2022-03-07 NOTE — Progress Notes (Signed)
Pt c/o pain in the ovaries. 7/10 for 1 week. Pt also c/o lower back pain, increased urination and burning with urination for 1 day.  Last PAP:07/26/2021 Pelvic US: 07/31/2021

## 2022-03-07 NOTE — Progress Notes (Signed)
Patient ID: Danielle Holden, female   DOB: 1970-08-25, 51 y.o.   MRN: [id number]  Chief Complaint  Patient presents with   Gynecologic Exam    HPI Danielle Holden is a 51 y.o. female.  Complains of pelvic pain.  She h111as a history of ovarian cysts. HPI  Past Medical History:  Diagnosis Date   Abnormal uterine bleeding    Abnormal uterine bleeding (AUB) 07/13/2013   Anxiety    Arthritis    hands, knees, shoulder, back    Asthma    Asthma in adult, mild persistent, uncomplicated 05/29/2018   Chest pain 06/27/2013   Colitis    History of   Complication of anesthesia 01/1992   During tubal ligation heart stopped and she stopped breathing   Depression    Environmental and seasonal allergies    Fatigue    Gallstones    GERD (gastroesophageal reflux disease)    Heart rate slow    per watch   History of kidney stones    History of kidney stones 05/29/2018   History of shingles    Hypoglycemia    IBS (irritable bowel syndrome)    Iron deficiency anemia    receive iron infusion   Lactose intolerance    Lumbar herniated disc    per patient   Lupus (HCC)    Migraine    Migraine without aura and without status migrainosus, not intractable 07/26/2014   Migraine, unspecified, without mention of intractable migraine without mention of status migrainosus 09/28/2013   Numbness and tingling of both upper extremities    first thing in the morning when she wakes up   Numbness and tingling of lower extremity    first thing in the morning when she wakes up   Osteoporosis    per patient, DEXA in 07/2019   RA (rheumatoid arthritis) (HCC)    Ventral hernia    Vertigo     Past Surgical History:  Procedure Laterality Date   APPENDECTOMY     CESAREAN SECTION     CHOLECYSTECTOMY [name] 08/07/2017   Procedure: LAPAROSCOPIC CHOLECYSTECTOMY, PRIMARY REPAIR OF UMBILICAL HERNIA;  Surgeon: Andria Meuse, MD;  Location: [facility];  Service: General;  Laterality: [name];   COLONOSCOPY     IUD  REMOVAL [name] 11/12/2014   TUBAL LIGATION     UMBILICAL HERNIA REPAIR      Family History  Problem Relation Age of Onset   Suicidality Father    Diabetes Maternal Grandmother    Breast cancer Maternal Grandmother    Cervical cancer Paternal Grandmother    Heart defect Son    Healthy Son    Healthy Daughter     Social History Social History   Tobacco Use   Smoking status: Never   Smokeless tobacco: Never  Vaping Use   Vaping Use: Never used  Substance Use Topics   Alcohol use: No    Alcohol/week: 0.0 standard drinks of alcohol   Drug use: No    Comment: Quit in 1991    Allergies  Allergen Reactions   Anesthetics, Amide Anaphylaxis and Other (See Comments)    Heart stopped and stopped breathing   Benzocaine Anaphylaxis   Black Pepper [[place]per] Anaphylaxis    Throat swelling   Mushroom Extract Complex Anaphylaxis    Mouth swelling   Trazodone And Nefazodone Anaphylaxis    Tongue swells.   Amoxicillin Hives and Diarrhea    Has patient had a PCN reaction causing immediate rash, facial/tongue/throat swelling, SOB or  lightheadedness with hypotension: No Has patient had a PCN reaction causing severe rash involving mucus membranes or skin necrosis: Yes Has patient had a PCN reaction that required hospitalization: Yes Has patient had a PCN reaction occurring within the last 10 years: Yes If all of the above answers are "NO", then may proceed with Cephalosporin use.    Bee Venom Swelling   Benadryl [Diphenhydramine]     Heavy periods    Effexor [Venlafaxine] Hives   Lactose Intolerance (Gi)     Stomach pain   Nortriptyline Hives   Nsaids     Make colitis worse   Other Diarrhea    Red meat - stomach pains, bleeding     Current Outpatient Medications  Medication Sig Dispense Refill   acetaminophen (TYLENOL) 325 MG tablet Take 650 mg by mouth every 6 (six) hours as needed.     albuterol (VENTOLIN HFA) 108 (90 Base) MCG/ACT inhaler Inhale 1-2 puffs into the lungs every 4  (four) hours as needed for wheezing. 2 each 11   alendronate (FOSAMAX) 70 MG tablet TAKE 1 TABLET BY MOUTH ONCE A WEEK. TAKE WITH A FULL GLASS OF WATER ON AN EMPTY STOMACH. 12 tablet 0   EPINEPHRINE 0.3 mg/0.3 mL IJ SOAJ injection INJECT 0.3 MLS (0.3 MG TOTAL) INTO THE MUSCLE AS NEEDED FOR ANAPHYLAXIS. 2 each 2   Erenumab-aooe (AIMOVIG) 140 MG/ML SOAJ Inject 140 mg into the skin every 28 (twenty-eight) days. 1.12 mL 5   hydroxychloroquine (PLAQUENIL) 200 MG tablet Take 200mg  by mouth twice daily, Monday through Friday only. None on Saturday or Sunday. 120 tablet 0   ibuprofen (ADVIL) 800 MG tablet Take 1 tablet (800 mg total) by mouth every 8 (eight) hours as needed. Take after a meal. 30 tablet 5   levocetirizine (XYZAL) 5 MG tablet Take 1 tablet (5 mg total) by mouth every evening. 30 tablet 6   meclizine (ANTIVERT) 25 MG tablet Take 1 tablet (25 mg total) by mouth 3 (three) times daily as needed for dizziness (VERTIGO). 30 tablet 11   mometasone (NASONEX) 50 MCG/ACT nasal spray One spray in each nostril twice a day, use left hand for right nostril, and right hand for left nostril.  Please dispense one bottle. 1 g 6   ondansetron (ZOFRAN ODT) 4 MG disintegrating tablet Take 1 tablet (4 mg total) by mouth every 8 (eight) hours as needed for nausea or vomiting. 20 tablet 5   Prenat-FeFum-DSS-FA-DHA w/o A (PNV-DHA+DOCUSATE) 27-1.25-300 MG CAPS Take 1 capsule by mouth daily before breakfast. 90 capsule 4   Rimegepant Sulfate (NURTEC) 75 MG TBDP Take 75 mg by mouth daily as needed. 8 tablet 5   Vitamin D, Ergocalciferol, (DRISDOL) 1.25 MG (50000 UNIT) CAPS capsule TAKE 1 CAPSULE BY MOUTH EVERY 7 DAYS FOR 6 WEEKS. (Patient not taking: Reported on 03/07/2022) 6 capsule 0   Vitamin D, Ergocalciferol, (DRISDOL) 1.25 MG (50000 UNIT) CAPS capsule Take 1 capsule (50,000 Units total) by mouth every 7 (seven) days. 5 capsule 0   No current facility-administered medications for this visit.    Review of  Systems Review of Systems Constitutional: negative for fatigue and weight loss Respiratory: negative for cough and wheezing Cardiovascular: negative for chest pain, fatigue and palpitations Gastrointestinal: negative for abdominal pain and change in bowel habits Genitourinary:positive for,pelvic pain Integument/breast: negative for nipple discharge Musculoskeletal:negative for myalgias Neurological: negative for gait problems and tremors Behavioral/Psych: negative for abusive relationship, depression Endocrine: negative for temperature intolerance      Blood  pressure 136/81, pulse (!) 54, height 5\' 3"  (1.6 m), weight 162 lb 11.2 oz (73.8 kg), last menstrual period 07/20/2018.  Physical Exam Physical Exam General:   alert  Skin:   no rash or abnormalities  Lungs:   clear to auscultation bilaterally  Heart:   regular rate and rhythm, S1, S2 normal, no murmur, click, rub or gallop  Breasts:   normal without suspicious masses, skin or nipple changes or axillary nodes  Abdomen:  normal findings: no organomegaly, soft, non-tender and no hernia  Pelvis:  External genitalia: normal general appearance Urinary system: urethral meatus normal and bladder without fullness, nontender Vaginal: normal without tenderness, induration or masses Cervix: normal appearance Adnexa: normal bimanual exam Uterus: anteverted and non-tender, normal size    I have spent a total of 30 minutes of face-to-face and non-face-to-face time, excluding clinical staff time, reviewing notes and preparing to see patient, ordering tests and/or medications, and counseling the patient.   Data Reviewed Labs Ultrasound  Assessment     1. Pelvic pain Rx: - 09/19/2018 PELVIC COMPLETE WITH TRANSVAGINAL; Future  2. Dysuria Rx: - Urine Culture  3. Screening breast examination Rx: - MM Digital Screening; Future  4. Osteoporosis without current pathological fracture, unspecified osteoporosis type Rx: - DG BONE DENSITY (DXA);  Future - VITAMIN D 25 Hydroxy (Vit-D Deficiency, Fractures)  5. Vitamin deficiency Rx: - Prenat-FeFum-DSS-FA-DHA w/o A (PNV-DHA+DOCUSATE) 27-1.25-300 MG CAPS; Take 1 capsule by mouth daily before breakfast.  Dispense: 90 capsule; Refill: 4 - Vitamin D, Ergocalciferol, (DRISDOL) 1.25 MG (50000 UNIT) CAPS capsule; Take 1 capsule (50,000 Units total) by mouth every 7 (seven) days.  Dispense: 5 capsule; Refill: 0     Plan   Follow up in 2 weeks  Orders Placed This Encounter  Procedures   Urine Culture   09-09-1970 PELVIC COMPLETE WITH TRANSVAGINAL    Standing Status:   Future    Standing Expiration Date:   03/08/2023    Order Specific Question:   Reason for Exam (SYMPTOM  OR DIAGNOSIS REQUIRED)    Answer:   Pelvic pain    Order Specific Question:   Preferred imaging location?    Answer:   [facility]   MM Digital Screening    Standing Status:   Future    Standing Expiration Date:   03/08/2023    Order Specific Question:   Reason for Exam (SYMPTOM  OR DIAGNOSIS REQUIRED)    Answer:   Screening    Order Specific Question:   Is the patient pregnant?    Answer:   No    Order Specific Question:   Preferred imaging location?    Answer:   [facility]   DG BONE DENSITY (DXA)    Standing Status:   Future    Standing Expiration Date:   03/08/2023    Order Specific Question:   Reason for Exam (SYMPTOM  OR DIAGNOSIS REQUIRED)    Answer:   Osteoporosis    Order Specific Question:   Is the patient pregnant?    Answer:   No    Order Specific Question:   Preferred imaging location?    Answer:   [facility]   VITAMIN D 25 Hydroxy (Vit-D Deficiency, Fractures)      [facility], MD 03/07/2022 12:20 PM

## 2022-03-08 ENCOUNTER — Ambulatory Visit (HOSPITAL_BASED_OUTPATIENT_CLINIC_OR_DEPARTMENT_OTHER)
Admission: RE | Admit: 2022-03-08 | Discharge: 2022-03-08 | Disposition: A | Discharge status: Home / Self Care | Payer: Medicaid Other | Source: Ambulatory Visit | Attending: Obstetrics | Admitting: Obstetrics

## 2022-03-08 DIAGNOSIS — M810 Age-related osteoporosis without current pathological fracture: Secondary | ICD-10-CM | POA: Insufficient documentation

## 2022-03-08 DIAGNOSIS — R102 Pelvic and perineal pain: Secondary | ICD-10-CM | POA: Diagnosis not present

## 2022-03-08 LAB — VITAMIN D 25 HYDROXY (VIT D DEFICIENCY, FRACTURES): Vit D, 25-Hydroxy: 40.8 ng/mL (ref 30.0–100.0)

## 2022-03-09 NOTE — Progress Notes (Signed)
Office Visit Note  Patient: Danielle Holden             Date of Birth: [birth year]           MRN: [id number]             PCP: Tollie Eth, NP Referring: Tollie Eth, NP Visit Date: 03/16/2022 Occupation: @GUAROCC @  Subjective:  Medication management  History of Present Illness: Danielle Holden is a 51 y.o. female with history of systemic lupus, osteoarthritis, degenerative disc disease and osteoporosis.  She states that she continues to have dry mouth and dry eyes.  She had recent urinary tract infection and had to stop hydroxychloroquine.  She continues to have intermittent malar rash and lymphadenopathy.  She continues to have pain and discomfort in her right knee joint and some pain in the muscles.  She had a recent bone density which showed deterioration of the bone mass density.  She states is difficult for her to take Fosamax on a regular basis due to gastroesophageal reflux.  She would like to discuss other treatment options.  She has been taking calcium and vitamin D.  Activities of Daily Living:  Patient reports morning stiffness for all day. Patient Reports nocturnal pain.  Difficulty dressing/grooming: Reports Difficulty climbing stairs: Reports Difficulty getting out of chair: Reports Difficulty using hands for taps, buttons, cutlery, and/or writing: Reports  Review of Systems  Constitutional:  Positive for fatigue.  HENT:  Positive for mouth dryness. Negative for mouth sores.   Eyes:  Negative for dryness.  Respiratory:  Positive for shortness of breath. Negative for difficulty breathing.   Cardiovascular:  Positive for irregular heartbeat. Negative for chest pain and palpitations.  Gastrointestinal:  Positive for diarrhea. Negative for blood in stool and constipation.  Endocrine: Positive for increased urination.  Genitourinary:  Negative for involuntary urination.  Musculoskeletal:  Positive for joint pain, joint pain, joint swelling, myalgias, muscle  weakness, morning stiffness, muscle tenderness and myalgias. Negative for gait problem.  Skin:  Positive for rash and hair loss. Negative for color change and sensitivity to sunlight.  Allergic/Immunologic: Negative for susceptible to infections.  Neurological:  Positive for dizziness and headaches.  Hematological:  Positive for swollen glands.  Psychiatric/Behavioral:  Positive for sleep disturbance. Negative for depressed mood. The patient is nervous/anxious.     PMFS History:  Patient Active Problem List   Diagnosis Date Noted   Patellofemoral pain syndrome of right knee 12/12/2021   DDD (degenerative disc disease), cervical 09/11/2021   Scoliosis 09/11/2021   Other systemic lupus erythematosus with other organ involvement (HCC) 09/01/2021   Asthma due to seasonal allergies 09/01/2021   Insomnia 09/01/2021   Myofascial pain 05/29/2018   History of IBS 05/29/2018   History of gastroesophageal reflux (GERD) 05/29/2018   History of TIA (transient ischemic attack) 05/29/2018   History of rheumatoid arthritis 10/25/2016   Seasonal allergies 10/25/2016   Ventral hernia 10/25/2016   Vertigo 10/25/2016   Fibromyalgia 03/05/2016   Other specified abnormal immunological findings in serum 03/05/2016   Raynaud's disease without gangrene 03/05/2016   Generalized anxiety disorder 07/26/2014   Hx of migraines 01/27/2014   Iron deficiency anemia 06/30/2013   Pain in joints 06/04/2013   Herpes zoster 02/22/2012    Past Medical History:  Diagnosis Date   Abnormal uterine bleeding    Abnormal uterine bleeding (AUB) 07/13/2013   Anxiety    Arthritis    hands, knees, shoulder, back    Asthma  Asthma in adult, mild persistent, uncomplicated 07/27/1759   Chest pain 06/27/2013   Colitis    History of   Complication of anesthesia 01/1992   During tubal ligation heart stopped and she stopped breathing   Depression    Environmental and seasonal allergies    Fatigue    Gallstones    GERD  (gastroesophageal reflux disease)    Heart rate slow    per watch   History of kidney stones    History of kidney stones 05/29/2018   History of shingles    Hypoglycemia    IBS (irritable bowel syndrome)    Iron deficiency anemia    receive iron infusion   Lactose intolerance    Lumbar herniated disc    per patient   Lupus (HCC)    Migraine    Migraine without aura and without status migrainosus, not intractable 07/26/2014   Migraine, unspecified, without mention of intractable migraine without mention of status migrainosus 09/28/2013   Numbness and tingling of both upper extremities    first thing in the morning when she wakes up   Numbness and tingling of lower extremity    first thing in the morning when she wakes up   Osteoporosis    per patient, DEXA in 07/2019   RA (rheumatoid arthritis) (HCC)    Ventral hernia    Vertigo     Family History  Problem Relation Age of Onset   Suicidality Father    Diabetes Maternal Grandmother    Breast cancer Maternal Grandmother    Cervical cancer Paternal Grandmother    Heart defect Son    Healthy Son    Healthy Daughter    Past Surgical History:  Procedure Laterality Date   APPENDECTOMY     CESAREAN SECTION     CHOLECYSTECTOMY [name] 08/07/2017   Procedure: LAPAROSCOPIC CHOLECYSTECTOMY, PRIMARY REPAIR OF UMBILICAL HERNIA;  Surgeon: Ileana Roup, MD;  Location: [facility];  Service: General;  Laterality: [name];   COLONOSCOPY     IUD REMOVAL [name] 11/12/2014   TUBAL LIGATION     UMBILICAL HERNIA REPAIR     Social History   Social History Narrative   Patient lives at home alone and she is disabled.   Education 9th grade.   Right handed.   Caffeine one cup of coffee not daily.    Immunization History  Administered Date(s) Administered   Influenza,inj,Quad PF,6+ Mos 03/06/2018, 02/11/2019   Influenza-Unspecified 03/06/2018, 02/11/2019, 01/13/2020, 01/13/2020, 02/22/2021   PFIZER(Purple Top)SARS-COV-2 Vaccination 08/01/2019,  08/22/2019, 03/04/2020   Tetanus 01/13/2020     Objective: Vital Signs: BP 123/79 (BP Location: Right Arm, Patient Position: Sitting, Cuff Size: Normal)   Pulse 68   Resp 14   Ht 5\' 3"  (1.6 m)   Wt 165 lb (74.8 kg)   LMP 07/20/2018 (Exact Date)   BMI 29.23 kg/m    Physical Exam Vitals and nursing note reviewed.  Constitutional:      Appearance: She is well-developed.  HENT:     Head: Normocephalic and atraumatic.  Eyes:     Conjunctiva/sclera: Conjunctivae normal.  Cardiovascular:     Rate and Rhythm: Normal rate and regular rhythm.     Heart sounds: Normal heart sounds.  Pulmonary:     Effort: Pulmonary effort is normal.     Breath sounds: Normal breath sounds.  Abdominal:     General: Bowel sounds are normal.     Palpations: Abdomen is soft.  Musculoskeletal:     Cervical back: Normal  range of motion.  Lymphadenopathy:     Cervical: No cervical adenopathy.  Skin:    General: Skin is warm and dry.     Capillary Refill: Capillary refill takes less than 2 seconds.  Neurological:     Mental Status: She is alert and oriented to person, place, and time.  Psychiatric:        Behavior: Behavior normal.      Musculoskeletal Exam: Cervical spine was in good range of motion.  Shoulder joints, elbow joints, wrist joints, MCPs PIPs and DIPs with good range of motion with no synovitis.  Hip joints and knee joints with good range of motion.  She had discomfort range of motion of her right knee joint without any warmth swelling or effusion.  There was no tenderness over ankles or MTPs.  CDAI Exam: CDAI Score: -- Patient Global: --; Provider Global: -- Swollen: --; Tender: -- Joint Exam 03/16/2022   No joint exam has been documented for this visit   There is currently no information documented on the homunculus. Go to the Rheumatology activity and complete the homunculus joint exam.  Investigation: No additional findings.  Imaging: DG BONE DENSITY (DXA)  Result Date:  03/08/2022 EXAM: DUAL X-RAY ABSORPTIOMETRY (DXA) FOR BONE MINERAL DENSITY IMPRESSION: Referring Physician:  Brock Bad Your patient completed a bone mineral density test using GE Lunar iDXA system (analysis version: 16). Technologist: ALW PATIENT: Name: Geana, Walts Patient ID: [id number] Birth Date: [birth year] Height: 63.0 in. Sex: Female Measured: 03/08/2022 Weight: 162.7 lbs. Indications: Estrogen Deficiency, History of osteoporosis, Lupus, Plaquenil, Post Menopausal, Rheumatoid Arthritis Fractures: Treatments: Vitamin D ASSESSMENT: The BMD measured at AP Spine L1-L4 is 0.823 g/cm2 with a T-score of -3.0. This patient is considered osteoporotic according to World Health Organization [facility]) criteria. The scan quality is good. Site Region Measured Date Measured Age YA BMD Significant CHANGE T-score AP Spine  L1-L4      03/08/2022    52.9         -3.0    0.823 g/cm2 DualFemur Neck Right 03/08/2022    52.9         -1.7    0.805 g/cm2 DualFemur Total Mean 03/08/2022    52.9         -1.5    0.815 g/cm2 World Health Organization [facility]) criteria for post-menopausal, Caucasian Women: Normal       T-score at or above -1 SD Osteopenia   T-score between -1 and -2.5 SD Osteoporosis T-score at or below -2.5 SD RECOMMENDATION: 1. All patients should optimize calcium and vitamin D intake. 2. Consider FDA approved medical therapies in postmenopausal women and men aged 69 years and older, based on the following: a. A hip or vertebral (clinical or morphometric) fracture b. T-score = -2.5 at the femoral neck or spine after appropriate evaluation to exclude secondary causes c. Low bone mass (T-score between -1.0 and -2.5 at the femoral neck or spine) and a 10- year probability of a hip fracture = 3% or a 10 year probability of a major osteoporosis-related fracture = 20% based on the [place]-adapted WHO algorithm. 3. Clinician judgement and/or patient preference may indicate treatment for people with 10-year fracture probabilities  above or below these levels. FOLLOW-UP: Patients with diagnosis of osteoporosis or at high risk for fracture should have regular bone mineral density tests. For patients eligible for Medicare routine testing is allowed once every 2 years. The testing frequency can be increased to one year for patients who have rapidly  progressing disease, those who are receiving or discontinuing medical therapy to restore bone mass, or have additional risk factors. I have reviewed this study and agree with the findings. [facility] Electronically Signed   By: Frederico Hamman M.D.   On: 03/08/2022 10:46   US PELVIC COMPLETE WITH TRANSVAGINAL  Result Date: 03/08/2022 CLINICAL DATA:  Pelvic pain for 1 year worse in past week, G4P3, postmenopausal EXAM: TRANSABDOMINAL AND TRANSVAGINAL ULTRASOUND OF PELVIS TECHNIQUE: Both transabdominal and transvaginal ultrasound examinations of the pelvis were performed. Transabdominal technique was performed for global imaging of the pelvis including uterus, ovaries, adnexal regions, and pelvic cul-de-sac. It was necessary to proceed with endovaginal exam following the transabdominal exam to visualize the uterus, endometrium, and ovaries. COMPARISON:  07/31/2021 FINDINGS: Uterus Measurements: 5.1 x 3.2 x 4.2 cm = volume: 36 mL. Retroflexed. Atrophic. Heterogeneous. No focal mass. Endometrium Thickness: 6 mm.  No endometrial fluid or mass Right ovary Measurements: 1.9 x 1.1 x 1.4 cm = volume: 1.6 mL. Normal morphology without mass Left ovary Measurements: 2.2 x 1.4 x 1.2 cm = volume: 1.9 mL. Normal morphology without mass Other findings No free pelvic fluid or adnexal masses. IMPRESSION: No pelvic sonographic abnormalities identified. Electronically Signed   By: Ulyses Southward M.D.   On: 03/08/2022 10:08    Recent Labs: Lab Results  Component Value Date   WBC 10.6 07/26/2021   HGB 16.1 (H) 07/26/2021   PLT 280 07/26/2021   NA 145 (H) 07/26/2021   K 4.4 07/26/2021   CL 105  07/26/2021   CO2 22 07/26/2021   GLUCOSE 79 07/26/2021   BUN 10 07/26/2021   CREATININE 0.76 07/26/2021   BILITOT 0.4 07/26/2021   ALKPHOS 125 (H) 07/26/2021   AST 16 07/26/2021   ALT 13 07/26/2021   PROT 7.9 07/26/2021   ALBUMIN 5.0 (H) 07/26/2021   CALCIUM 9.9 07/26/2021   GFRAA 114 04/11/2020    Speciality Comments: Do not refill PLQ until eye exam complete 03/16/2022 shc Fosamax12/21  Procedures:  No procedures performed Allergies: Anesthetics, amide; Benzocaine; Black pepper [piper]; Mushroom extract complex; Trazodone and nefazodone; Amoxicillin; Bee venom; Benadryl [diphenhydramine]; Effexor [venlafaxine]; Lactose intolerance (gi); Nortriptyline; Nsaids; and Other   Assessment / Plan:     Visit Diagnoses: Other systemic lupus erythematosus with other organ involvement (HCC) - +ANA, +dsDNA,history of oral ulcers, arthralgias, joint swelling, photosensitivity, rash: She was treated with Plaquenil in [place].  -She continues to have fatigue, photosensitivity, rash and sicca symptoms.  No lymphadenopathy was noted.  No rash was noted.  Patient states that she is off Plaquenil currently because of UTI.  She will be resuming Plaquenil soon.  I will check labs today.  Plan: Protein / creatinine ratio, urine, Anti-DNA antibody, double-stranded, C3 and C4, Sedimentation rate  High risk medication use - hydroxychloroquine 200 mg p.o. twice daily Monday to Friday. -Labs obtained on July 26, 2021 were reviewed which were within normal limits except for mildly elevated alkaline phosphatase.  Plan: CBC with Differential/Platelet, COMPLETE METABOLIC PANEL WITH GFR.  Patient states she has been getting eye examinations every 2 years.  I do not have a documented eye examination.  I advised her that we will not be able to fill the prescription for hydroxychloroquine without an eye examination.  Patient was referred to an ophthalmologist.  Myofascial pain-she continues to have some generalized pain  and discomfort.  Other fatigue-she continues to have fatigue.  Trochanteric bursitis of both hips-she gives history of discomfort in the  trochanteric region.  IT band stretches were demonstrated.  DDD (degenerative disc disease), lumbar - She had a CT of the lumbar spine on 11/02/2020 which revealed mild disc degeneration and bulging at L5-S1.  She continues to have lower back pain.  Age-related osteoporosis without current pathological fracture - Fosamax  by mouth once weekly. DEXA from July 31, 2019 with T score of -2.6.  March 08, 2022 DEXA scan showed T score of -3.0, BMD 0.823 in the AP spine.  Patient states she is not able to take Fosamax on a regular basis as it causes reflux symptoms.  I did detailed discussion with the patient regarding the use of IV Reclast.  Side effects of IV Reclast including the increased risk of osteonecrosis of the jaw and atypical fractures were discussed.  Patient wants to proceed with IV Reclast.  We will apply for IV Reclast.  I advised her to discuss this with her dentist if needed.  Other medical problems listed as follows:  Vertigo - Evaluated by Dr. Everlena Cooper.   History of TIA (transient ischemic attack)  Anxiety and depression - she had intolerance to multiple medications in the past.  History of gastroesophageal reflux (GERD)  History of IBS  History of asthma  History of kidney stones  Vitamin D deficiency-vitamin D was normal at 40.8 on March 07, 2022.  Hx of migraines - Followed by Dr. Everlena Cooper.   Orders: Orders Placed This Encounter  Procedures   Protein / creatinine ratio, urine   CBC with Differential/Platelet   COMPLETE METABOLIC PANEL WITH GFR   Anti-DNA antibody, double-stranded   C3 and C4   Sedimentation rate   Ambulatory referral to Ophthalmology   No orders of the defined types were placed in this encounter.   Follow-Up Instructions: Return in about 3 months (around 06/16/2022) for Systemic lupus, Osteoarthritis,  Osteoporosis.   Pollyann Savoy, MD  Note - This record has been created using Animal nutritionist.  Chart creation errors have been sought, but may not always  have been located. Such creation errors do not reflect on  the standard of medical care.

## 2022-03-10 LAB — URINE CULTURE

## 2022-03-12 ENCOUNTER — Encounter: Payer: Self-pay | Admitting: Obstetrics

## 2022-03-12 ENCOUNTER — Other Ambulatory Visit: Payer: Self-pay | Admitting: *Deleted

## 2022-03-12 DIAGNOSIS — N3000 Acute cystitis without hematuria: Secondary | ICD-10-CM

## 2022-03-12 MED ORDER — NITROFURANTOIN MONOHYD MACRO 100 MG PO CAPS: 100.0000 mg | ORAL_CAPSULE | Freq: Two times a day (BID) | ORAL | 1 refills | Status: DC

## 2022-03-14 ENCOUNTER — Other Ambulatory Visit: Payer: Self-pay | Admitting: Obstetrics

## 2022-03-14 ENCOUNTER — Telehealth: Payer: Self-pay | Admitting: *Deleted

## 2022-03-14 DIAGNOSIS — N3000 Acute cystitis without hematuria: Secondary | ICD-10-CM

## 2022-03-14 MED ORDER — CEFUROXIME AXETIL 500 MG PO TABS: 500.0000 mg | ORAL_TABLET | Freq: Two times a day (BID) | ORAL | 0 refills | Status: DC

## 2022-03-14 NOTE — Progress Notes (Signed)
RX changed to Ceftin. Pt advised. See telephone note.

## 2022-03-14 NOTE — Telephone Encounter (Signed)
TC to clarify which RX pt should take for Strep UTI. RX Ceftin sent by Dr. Jodi Mourning, after RX Macrobid was sent 03/13/22. Dr. Jodi Mourning recommends RX Ceftin over [employer]. Pt advised to take Ceftin only and complete RX as directed and drink plenty of water. Pt verbalized understanding. Pt asking about follow up for other tests ordered. Per visit note, pt needs f/u in 2 weeks. Call transferred to schedulers.

## 2022-03-16 ENCOUNTER — Encounter: Payer: Self-pay | Admitting: Rheumatology

## 2022-03-16 ENCOUNTER — Ambulatory Visit: Payer: Medicaid Other | Attending: Rheumatology | Admitting: Rheumatology

## 2022-03-16 ENCOUNTER — Telehealth: Payer: Self-pay | Admitting: Pharmacist

## 2022-03-16 VITALS — BP 123/79 | HR 68 | Resp 14 | Ht 63.0 in | Wt 165.0 lb

## 2022-03-16 DIAGNOSIS — Z87442 Personal history of urinary calculi: Secondary | ICD-10-CM

## 2022-03-16 DIAGNOSIS — M3219 Other organ or system involvement in systemic lupus erythematosus: Secondary | ICD-10-CM

## 2022-03-16 DIAGNOSIS — F419 Anxiety disorder, unspecified: Secondary | ICD-10-CM

## 2022-03-16 DIAGNOSIS — Z8719 Personal history of other diseases of the digestive system: Secondary | ICD-10-CM

## 2022-03-16 DIAGNOSIS — M7062 Trochanteric bursitis, left hip: Secondary | ICD-10-CM

## 2022-03-16 DIAGNOSIS — Z8709 Personal history of other diseases of the respiratory system: Secondary | ICD-10-CM

## 2022-03-16 DIAGNOSIS — Z8669 Personal history of other diseases of the nervous system and sense organs: Secondary | ICD-10-CM

## 2022-03-16 DIAGNOSIS — Z8673 Personal history of transient ischemic attack (TIA), and cerebral infarction without residual deficits: Secondary | ICD-10-CM

## 2022-03-16 DIAGNOSIS — R5383 Other fatigue: Secondary | ICD-10-CM | POA: Diagnosis not present

## 2022-03-16 DIAGNOSIS — R42 Dizziness and giddiness: Secondary | ICD-10-CM

## 2022-03-16 DIAGNOSIS — M5136 Other intervertebral disc degeneration, lumbar region: Secondary | ICD-10-CM

## 2022-03-16 DIAGNOSIS — M7061 Trochanteric bursitis, right hip: Secondary | ICD-10-CM

## 2022-03-16 DIAGNOSIS — Z79899 Other long term (current) drug therapy: Secondary | ICD-10-CM | POA: Diagnosis not present

## 2022-03-16 DIAGNOSIS — M7918 Myalgia, other site: Secondary | ICD-10-CM | POA: Diagnosis not present

## 2022-03-16 DIAGNOSIS — M810 Age-related osteoporosis without current pathological fracture: Secondary | ICD-10-CM

## 2022-03-16 DIAGNOSIS — F32A Depression, unspecified: Secondary | ICD-10-CM

## 2022-03-16 DIAGNOSIS — E559 Vitamin D deficiency, unspecified: Secondary | ICD-10-CM

## 2022-03-16 NOTE — Patient Instructions (Addendum)
Standing Labs We placed an order today for your standing lab work.   Please have your standing labs drawn in April  Please have your labs drawn 2 weeks prior to your appointment so that the provider can discuss your lab results at your appointment.  Please note that you may see your imaging and lab results in [place] before we have reviewed them. We will contact you once all results are reviewed. Please allow our office up to 72 hours to thoroughly review all of the results before contacting the office for clarification of your results.  Lab hours are:   Monday through Thursday from 8:00 am -12:30 pm and 1:00 pm-5:00 pm and Friday from 8:00 am-12:00 pm.  Please be advised, all patients with office appointments requiring lab work will take precedent over walk-in lab work.   Labs are drawn by [facility]. Please bring your co-pay at the time of your lab draw.  You may receive a bill from [place] for your lab work.  Please note if you are on Hydroxychloroquine and and an order has been placed for a Hydroxychloroquine level, you will need to have it drawn 4 hours or more after your last dose.  If you wish to have your labs drawn at another location, please call the office 24 hours in advance so we can fax the orders.  The office is located at [address], [place], [place], [place] [address] No appointment is necessary.    If you have any questions regarding directions or hours of operation,  please call [phone].   As a reminder, please drink plenty of water prior to coming for your lab work. Thanks!  Vaccines You are taking a medication(s) that can suppress your immune system.  The following immunizations are recommended: Flu annually Covid-19  Td/Tdap (tetanus, diphtheria, pertussis) every 10 years Pneumonia (Prevnar 15 then Pneumovax 23 at least 1 year apart.  Alternatively, can take Prevnar 20 without needing additional dose) Shingrix: 2 doses from 4 weeks to 6 months  apart  Please check with your PCP to make sure you are up to date.   If you have signs or symptoms of an infection or start antibiotics: First, call your PCP for workup of your infection. Hold your medication through the infection, until you complete your antibiotics, and until symptoms resolve if you take the following: Injectable medication (Actemra, Benlysta, Cimzia, Cosentyx, Enbrel, Humira, Kevzara, Orencia, Remicade, Simponi, Stelara, Taltz, Tremfya) Methotrexate Leflunomide (Arava) Mycophenolate (Cellcept) Morrie Sheldon, Olumiant, or Rinvoq   Zoledronic Acid Injection (Bone Disorders) What is this medication? ZOLEDRONIC ACID (ZOE le dron ik AS id) prevents and treats osteoporosis. It may also be used to treat Paget's disease of the bone. It works by Paramedic stronger and less likely to break (fracture). It belongs to a group of medications called bisphosphonates. This medicine may be used for other purposes; ask your health care provider or pharmacist if you have questions. COMMON BRAND NAME(S): Reclast What should I tell my care team before I take this medication? They need to know if you have any of these conditions: Bleeding disorder Cancer Dental disease Kidney disease Low levels of calcium in the blood Low red blood cell counts Lung or breathing disease, such as asthma Receiving steroids, such as dexamethasone or prednisone An unusual or allergic reaction to zoledronic acid, other medications, foods, dyes, or preservatives Pregnant or trying to get pregnant Breast-feeding How should I use this medication? This medication is injected into a vein. It is  given by your care team in a hospital or clinic setting. A special MedGuide will be given to you before each treatment. Be sure to read this information carefully each time. Talk to your care team about the use of this medication in children. Special care may be needed. Overdosage: If you think you have taken too much  of this medicine contact a poison control center or emergency room at once. NOTE: This medicine is only for you. Do not share this medicine with others. What if I miss a dose? Keep appointments for follow-up doses. It is important not to miss your dose. Call your care team if you are unable to keep an appointment. What may interact with this medication? Certain antibiotics given by injection Medications for pain and inflammation, such as ibuprofen, naproxen, NSAIDs Some diuretics, such as bumetanide, furosemide Teriparatide This list may not describe all possible interactions. Give your health care provider a list of all the medicines, herbs, non-prescription drugs, or dietary supplements you use. Also tell them if you smoke, drink alcohol, or use illegal drugs. Some items may interact with your medicine. What should I watch for while using this medication? Visit your care team for regular checks on your progress. It may be some time before you see the benefit from this medication. Some people who take this medication have severe bone, joint, or muscle pain. This medication may also increase your risk for jaw problems or a broken thigh bone. Tell your care team right away if you have severe pain in your jaw, bones, joints, or muscles. Tell your care team if you have any pain that does not go away or that gets worse. You should make sure you get enough calcium and vitamin D while you are taking this medication. Discuss the foods you eat and the vitamins you take with your care team. You may need bloodwork while taking this medication. Tell your dentist and dental surgeon that you are taking this medication. You should not have major dental surgery while on this medication. See your dentist to have a dental exam and fix any dental problems before starting this medication. Take good care of your teeth while on this medication. Make sure you see your dentist for regular follow-up appointments. What side  effects may I notice from receiving this medication? Side effects that you should report to your care team as soon as possible: Allergic reactions--skin rash, itching, hives, swelling of the face, lips, tongue, or throat Kidney injury--decrease in the amount of urine, swelling of the ankles, hands, or feet Low calcium level--muscle pain or cramps, confusion, tingling, or numbness in the hands or feet Osteonecrosis of the jaw--pain, swelling, or redness in the mouth, numbness of the jaw, poor healing after dental work, unusual discharge from the mouth, visible bones in the mouth Severe bone, joint, or muscle pain Side effects that usually do not require medical attention (report to your care team if they continue or are bothersome): Diarrhea Dizziness Headache Nausea Stomach pain Vomiting This list may not describe all possible side effects. Call your doctor for medical advice about side effects. You may report side effects to FDA at 1-800-FDA-1088. Where should I keep my medication? This medication is given in a hospital or clinic. It will not be stored at home. NOTE: This sheet is a summary. It may not cover all possible information. If you have questions about this medicine, talk to your doctor, pharmacist, or health care provider.  2023 Elsevier/Gold Standard (2021-06-16 00:00:00)

## 2022-03-16 NOTE — Telephone Encounter (Addendum)
Please start RECLAST BIV through medical benefit.  Reclast - [id number]  Dose: 5mg  IV every 12 months  Dx: Age-related osteoporosis (M81.0)  Previously tried therapies: Alendronate 70mg  once weekly (completed max 9 months of treatment per disepnse history)  Knox Saliva, PharmD, MPH, BCPS, CPP Clinical Pharmacist (Rheumatology and Pulmonology)  ----- Message from [place] sent at 03/16/2022 10:25 AM EDT ----- Please apply for IV reclast per Dr. Estanislado Pandy. Patient has GER and decreased BMD. Thanks!

## 2022-03-17 LAB — CBC WITH DIFFERENTIAL/PLATELET
Absolute Monocytes: 592 cells/uL (ref 200–950)
Basophils Absolute: 43 cells/uL (ref 0–200)
Basophils Relative: 0.7 %
Eosinophils Absolute: 281 cells/uL (ref 15–500)
Eosinophils Relative: 4.6 %
HCT: 43.5 % (ref 35.0–45.0)
Hemoglobin: 14.7 g/dL (ref 11.7–15.5)
Lymphs Abs: 1958 cells/uL (ref 850–3900)
MCH: 32 pg (ref 27.0–33.0)
MCHC: 33.8 g/dL (ref 32.0–36.0)
MCV: 94.6 fL (ref 80.0–100.0)
MPV: 10.2 fL (ref 7.5–12.5)
Monocytes Relative: 9.7 %
Neutro Abs: 3227 cells/uL (ref 1500–7800)
Neutrophils Relative %: 52.9 %
Platelets: 250 10*3/uL (ref 140–400)
RBC: 4.6 10*6/uL (ref 3.80–5.10)
RDW: 12 % (ref 11.0–15.0)
Total Lymphocyte: 32.1 %
WBC: 6.1 10*3/uL (ref 3.8–10.8)

## 2022-03-17 LAB — COMPLETE METABOLIC PANEL WITH GFR
AG Ratio: 1.6 (calc) (ref 1.0–2.5)
ALT: 12 U/L (ref 6–29)
AST: 16 U/L (ref 10–35)
Albumin: 4.4 g/dL (ref 3.6–5.1)
Alkaline phosphatase (APISO): 100 U/L (ref 37–153)
BUN: 8 mg/dL (ref 7–25)
CO2: 26 mmol/L (ref 20–32)
Calcium: 9.1 mg/dL (ref 8.6–10.4)
Chloride: 107 mmol/L (ref 98–110)
Creat: 0.64 mg/dL (ref 0.50–1.03)
Globulin: 2.7 g/dL (calc) (ref 1.9–3.7)
Glucose, Bld: 77 mg/dL (ref 65–99)
Potassium: 4 mmol/L (ref 3.5–5.3)
Sodium: 142 mmol/L (ref 135–146)
Total Bilirubin: 0.4 mg/dL (ref 0.2–1.2)
Total Protein: 7.1 g/dL (ref 6.1–8.1)
eGFR: 106 mL/min/{1.73_m2} (ref >=60)

## 2022-03-17 LAB — PROTEIN / CREATININE RATIO, URINE
Creatinine, Urine: 34 mg/dL (ref 20–275)
Total Protein, Urine: <4 mg/dL — ABNORMAL LOW (ref 5–24)

## 2022-03-17 LAB — C3 AND C4
C3 Complement: 158 mg/dL (ref 83–193)
C4 Complement: 45 mg/dL (ref 15–57)

## 2022-03-17 LAB — SEDIMENTATION RATE: Sed Rate: 11 mm/h (ref 0–30)

## 2022-03-17 LAB — ANTI-DNA ANTIBODY, DOUBLE-STRANDED: ds DNA Ab: <1 IU/mL

## 2022-03-18 NOTE — Progress Notes (Signed)
CBC, CMP, sed rate, and urine protein creatinine ratio were normal double-stranded DNA negative, complements are normal, sed rate is normal.  Labs do not indicate an autoimmune disease flare.  Patient is supposed to get an eye examination prior to Plaquenil refill.  I would recommend reducing Plaquenil dose to 200 mg p.o. daily Monday to Friday only after the eye examination results are available.

## 2022-03-19 ENCOUNTER — Other Ambulatory Visit: Payer: Self-pay | Admitting: *Deleted

## 2022-03-19 MED ORDER — HYDROXYCHLOROQUINE SULFATE 200 MG PO TABS: ORAL_TABLET | ORAL | 0 refills | Status: DC

## 2022-03-20 ENCOUNTER — Other Ambulatory Visit (HOSPITAL_COMMUNITY): Payer: Self-pay

## 2022-03-20 NOTE — Telephone Encounter (Signed)
Patient Advocate Encounter  Received notification to complete Medical BIV for RECLAST.   Medical PA submitted on 11.7.23 Fax# [phone] Status is pending   Findings of BIV: Copay- $0 Deductible- $0 No out of pocket expense Co-insurance- $0 Pre certification Required- Yes  Reference # [id number]  Luciano Cutter, CPhT Patient Advocate Phone: [phone]

## 2022-03-21 NOTE — Progress Notes (Deleted)
NEUROLOGY FOLLOW UP OFFICE NOTE  Danielle Holden [id number]  Assessment/Plan:   1.  Migraine without aura, without status migrainosus, not intractable 2.  Migrainous vertigo   1.  Migraine prevention:  Aimovig 140mg  2.  Migraine rescue:  ***  3.  Ondansetron for nausea 4.  Keep headache diary 5.  Follow up in 6 months.     Subjective:  Danielle Holden is a 51 year old female who follows up for migraines.   UPDATE: Started Aimovig. Nurtec *** Intensity:  moderate to severe Duration:  all day Frequency:  has a dull underlying almost daily headache but migraine headaches occur 14-15 days a month. Frequency of abortive medication: ibuprofen daily Frequency of analgesics:   Current NSAIDS/analgesics:  ibuprofen 200mg  Current triptans:  none Current ergotamine:  none Current anti-emetic:  zofran Current muscle relaxants:  none Current Antihypertensive medications:  none Current Antidepressant medications:  none Current Anticonvulsant medications:  none Current anti-CGRP:  Aimovig 140mg , Nurtec PRN Current Vitamins/Herbal/Supplements:  MVI, D, prenatal, melatonin Current Antihistamines/Decongestants:  Flonase, Claritin, meclizine Other therapy:  none Hormone/birth control:  none   Caffeine:  1 cup of diluted coffee daily. No soda Diet:  Drinks 3-4 16 oz bottles of water daily.  Lactose intolerant Exercise:  Walking Depression:  no; Anxiety:  yes Other pain:  She has chronic pain with lupus Other autoimmune labs, including sed rate, were unremarkable. Sleep hygiene:  Poor.  Trouble staying asleep.  3 to 4 hours a night.  Just started melatonin   HISTORY:  She started getting migraines at 51 years old.  Sometimes they are mild but other times may be severe.  Either temple, bilateral occipital, bifrontal, neck, pounding.  Associated blurred vision and spots, otalgia, vertigo, slurred speech, nausea, photophobia, phonophobia, osmophobia, face flushed.  Usually lays down  to rest in dark room and may last 1-2 hours.  If she cannot rest, 3-4 hours.  Occurs every other day.  Triggers included seasonal allergies, loud noise and caffeine withdrawal.  Treats with Tylenol.   Has lupus with chronic pain syndrome.  Autoimmune workup otherwise unremarkable.  She has chronic unexplained right sided numbness of face, arm and leg previously worked up.  MRI of brain with and without contrast and MRA of head on 06/28/2013 to evaluate, were personally reviewed, and were unremarkable.     Past NSAIDS/analgesics:  Norco, naproxen, tramadol, Tylenol Past abortive triptans:  Rizatriptan, sumatriptan tab Past abortive ergotamine:  none Past muscle relaxants:  Flexeril Past anti-emetic:  Zofran Past antihypertensive medications:  propranolol Past antidepressant medications:  venlafaxine Past anticonvulsant medications:  topiramate Past anti-CGRP:  Past vitamins/Herbal/Supplements:  none Past antihistamines/decongestants:  Benadryl Other past therapies:  none     Family history of headache:  unknown  PAST MEDICAL HISTORY: Past Medical History:  Diagnosis Date   Abnormal uterine bleeding    Abnormal uterine bleeding (AUB) 07/13/2013   Anxiety    Arthritis    hands, knees, shoulder, back    Asthma    Asthma in adult, mild persistent, uncomplicated 05/29/2018   Chest pain 06/27/2013   Colitis    History of   Complication of anesthesia 01/1992   During tubal ligation heart stopped and she stopped breathing   Depression    Environmental and seasonal allergies    Fatigue    Gallstones    GERD (gastroesophageal reflux disease)    Heart rate slow    per watch   History of kidney stones  History of kidney stones 05/29/2018   History of shingles    Hypoglycemia    IBS (irritable bowel syndrome)    Iron deficiency anemia    receive iron infusion   Lactose intolerance    Lumbar herniated disc    per patient   Lupus (HCC)    Migraine    Migraine without aura  and without status migrainosus, not intractable 07/26/2014   Migraine, unspecified, without mention of intractable migraine without mention of status migrainosus 09/28/2013   Numbness and tingling of both upper extremities    first thing in the morning when she wakes up   Numbness and tingling of lower extremity    first thing in the morning when she wakes up   Osteoporosis    per patient, DEXA in 07/2019   RA (rheumatoid arthritis) (HCC)    Ventral hernia    Vertigo     MEDICATIONS: Current Outpatient Medications on File Prior to Visit  Medication Sig Dispense Refill   acetaminophen (TYLENOL) 325 MG tablet Take 650 mg by mouth every 6 (six) hours as needed.     albuterol (VENTOLIN HFA) 108 (90 Base) MCG/ACT inhaler Inhale 1-2 puffs into the lungs every 4 (four) hours as needed for wheezing. 2 each 11   alendronate (FOSAMAX) 70 MG tablet TAKE 1 TABLET BY MOUTH ONCE A WEEK. TAKE WITH A FULL GLASS OF WATER ON AN EMPTY STOMACH. 12 tablet 0   cefUROXime (CEFTIN) 500 MG tablet Take 1 tablet (500 mg total) by mouth 2 (two) times daily with a meal. 14 tablet 0   EPINEPHRINE 0.3 mg/0.3 mL IJ SOAJ injection INJECT 0.3 MLS (0.3 MG TOTAL) INTO THE MUSCLE AS NEEDED FOR ANAPHYLAXIS. 2 each 2   Erenumab-aooe (AIMOVIG) 140 MG/ML SOAJ Inject 140 mg into the skin every 28 (twenty-eight) days. 1.12 mL 5   hydroxychloroquine (PLAQUENIL) 200 MG tablet Take 200mg  by mouth Monday through Friday only. None on Saturday or Sunday. 60 tablet 0   ibuprofen (ADVIL) 800 MG tablet Take 1 tablet (800 mg total) by mouth every 8 (eight) hours as needed. Take after a meal. 30 tablet 5   levocetirizine (XYZAL) 5 MG tablet Take 1 tablet (5 mg total) by mouth every evening. 30 tablet 6   meclizine (ANTIVERT) 25 MG tablet Take 1 tablet (25 mg total) by mouth 3 (three) times daily as needed for dizziness (VERTIGO). 30 tablet 11   Melatonin 5 MG CAPS Take by mouth at bedtime.     mometasone (NASONEX) 50 MCG/ACT nasal spray One  spray in each nostril twice a day, use left hand for right nostril, and right hand for left nostril.  Please dispense one bottle. 1 g 6   nitrofurantoin, macrocrystal-monohydrate, (MACROBID) 100 MG capsule Take 1 capsule (100 mg total) by mouth 2 (two) times daily. (Patient not taking: Reported on 03/16/2022) 14 capsule 1   ondansetron (ZOFRAN ODT) 4 MG disintegrating tablet Take 1 tablet (4 mg total) by mouth every 8 (eight) hours as needed for nausea or vomiting. 20 tablet 5   Prenat w/o A-FE-Methfol-FA-DHA (PNV-DHA) 27-0.6-0.4-300 MG CAPS TAKE 1 CAPSULE BY MOUTH EVERY DAY BEFORE BREAKFAST 90 capsule 4   Rimegepant Sulfate (NURTEC) 75 MG TBDP Take 75 mg by mouth daily as needed. 8 tablet 5   Vitamin D, Ergocalciferol, (DRISDOL) 1.25 MG (50000 UNIT) CAPS capsule TAKE 1 CAPSULE BY MOUTH EVERY 7 DAYS FOR 6 WEEKS. (Patient not taking: Reported on 03/07/2022) 6 capsule 0   Vitamin D, Ergocalciferol, (  DRISDOL) 1.25 MG (50000 UNIT) CAPS capsule Take 1 capsule (50,000 Units total) by mouth every 7 (seven) days. 5 capsule 0   No current facility-administered medications on file prior to visit.    ALLERGIES: Allergies  Allergen Reactions   Anesthetics, Amide Anaphylaxis and Other (See Comments)    Heart stopped and stopped breathing   Benzocaine Anaphylaxis   Black Pepper [Piper] Anaphylaxis    Throat swelling   Mushroom Extract Complex Anaphylaxis    Mouth swelling   Trazodone And Nefazodone Anaphylaxis    Tongue swells.   Amoxicillin Hives and Diarrhea    Has patient had a PCN reaction causing immediate rash, facial/tongue/throat swelling, SOB or lightheadedness with hypotension: No Has patient had a PCN reaction causing severe rash involving mucus membranes or skin necrosis: Yes Has patient had a PCN reaction that required hospitalization: Yes Has patient had a PCN reaction occurring within the last 10 years: Yes If all of the above answers are "NO", then may proceed with Cephalosporin use.     Bee Venom Swelling   Benadryl [Diphenhydramine]     Heavy periods    Effexor [Venlafaxine] Hives   Lactose Intolerance (Gi)     Stomach pain   Nortriptyline Hives   Nsaids     Make colitis worse   Other Diarrhea    Red meat - stomach pains, bleeding     FAMILY HISTORY: Family History  Problem Relation Age of Onset   Suicidality Father    Diabetes Maternal Grandmother    Breast cancer Maternal Grandmother    Cervical cancer Paternal Grandmother    Heart defect Son    Healthy Son    Healthy Daughter       Objective:  *** General: No acute distress.  Patient appears well-groomed.   Head:  Normocephalic/atraumatic Eyes:  Fundi examined but not visualized Neck: supple, no paraspinal tenderness, full range of motion Heart:  Regular rate and rhythm Neurological Exam: alert and oriented to person, place, and time.  Speech fluent and not dysarthric, language intact.  Reduced V1-V3 sensory loss.  Otherwise, CN II-XII intact. Bulk and tone normal, muscle strength 5/5 throughout.  Sensation to light touch intact.  Deep tendon reflexes 2+ throughout, toes downgoing.  Finger to nose testing intact.  Gait normal, Romberg negative.   Shon Millet, DO  CC: Enid Skeens, NP

## 2022-03-22 ENCOUNTER — Encounter: Payer: Self-pay | Admitting: Obstetrics

## 2022-03-22 ENCOUNTER — Ambulatory Visit: Payer: Medicaid Other | Admitting: Obstetrics

## 2022-03-22 VITALS — HR 60 | Wt 164.0 lb

## 2022-03-22 DIAGNOSIS — M810 Age-related osteoporosis without current pathological fracture: Secondary | ICD-10-CM | POA: Diagnosis not present

## 2022-03-22 DIAGNOSIS — Z780 Asymptomatic menopausal state: Secondary | ICD-10-CM | POA: Diagnosis not present

## 2022-03-22 DIAGNOSIS — R102 Pelvic and perineal pain: Secondary | ICD-10-CM | POA: Diagnosis not present

## 2022-03-22 DIAGNOSIS — N3001 Acute cystitis with hematuria: Secondary | ICD-10-CM | POA: Diagnosis not present

## 2022-03-22 NOTE — Progress Notes (Signed)
Pt presents to follow up pelvic u/s.

## 2022-03-22 NOTE — Progress Notes (Signed)
Patient ID: Danielle Holden, female   DOB: 1971-04-02, 51 y.o.   MRN: [id number]  No chief complaint on file.   HPI Danielle Holden is a 51 y.o. female.  Complains of continued pelvic pain.  Presents today for ultrasound results, and Bone Density results.  Had UTI and took Ceftin. HPI  Past Medical History:  Diagnosis Date   Abnormal uterine bleeding    Abnormal uterine bleeding (AUB) 07/13/2013   Anxiety    Arthritis    hands, knees, shoulder, back    Asthma    Asthma in adult, mild persistent, uncomplicated 05/29/2018   Chest pain 06/27/2013   Colitis    History of   Complication of anesthesia 01/1992   During tubal ligation heart stopped and she stopped breathing   Depression    Environmental and seasonal allergies    Fatigue    Gallstones    GERD (gastroesophageal reflux disease)    Heart rate slow    per watch   History of kidney stones    History of kidney stones 05/29/2018   History of shingles    Hypoglycemia    IBS (irritable bowel syndrome)    Iron deficiency anemia    receive iron infusion   Lactose intolerance    Lumbar herniated disc    per patient   Lupus (HCC)    Migraine    Migraine without aura and without status migrainosus, not intractable 07/26/2014   Migraine, unspecified, without mention of intractable migraine without mention of status migrainosus 09/28/2013   Numbness and tingling of both upper extremities    first thing in the morning when she wakes up   Numbness and tingling of lower extremity    first thing in the morning when she wakes up   Osteoporosis    per patient, DEXA in 07/2019   RA (rheumatoid arthritis) (HCC)    Ventral hernia    Vertigo     Past Surgical History:  Procedure Laterality Date   APPENDECTOMY     CESAREAN SECTION     CHOLECYSTECTOMY [name] 08/07/2017   Procedure: LAPAROSCOPIC CHOLECYSTECTOMY, PRIMARY REPAIR OF UMBILICAL HERNIA;  Surgeon: Andria Meuse, MD;  Location: [facility];  Service: General;  Laterality:  [name];   COLONOSCOPY     IUD REMOVAL [name] 11/12/2014   TUBAL LIGATION     UMBILICAL HERNIA REPAIR      Family History  Problem Relation Age of Onset   Suicidality Father    Diabetes Maternal Grandmother    Breast cancer Maternal Grandmother    Cervical cancer Paternal Grandmother    Heart defect Son    Healthy Son    Healthy Daughter     Social History Social History   Tobacco Use   Smoking status: Never    Passive exposure: Never   Smokeless tobacco: Never  Vaping Use   Vaping Use: Never used  Substance Use Topics   Alcohol use: No    Alcohol/week: 0.0 standard drinks of alcohol   Drug use: No    Comment: Quit in 1991    Allergies  Allergen Reactions   Anesthetics, Amide Anaphylaxis and Other (See Comments)    Heart stopped and stopped breathing   Benzocaine Anaphylaxis   Black Pepper [Piper] Anaphylaxis    Throat swelling   Mushroom Extract Complex Anaphylaxis    Mouth swelling   Trazodone And Nefazodone Anaphylaxis    Tongue swells.   Amoxicillin Hives and Diarrhea    Has patient had a  PCN reaction causing immediate rash, facial/tongue/throat swelling, SOB or lightheadedness with hypotension: No Has patient had a PCN reaction causing severe rash involving mucus membranes or skin necrosis: Yes Has patient had a PCN reaction that required hospitalization: Yes Has patient had a PCN reaction occurring within the last 10 years: Yes If all of the above answers are "NO", then may proceed with Cephalosporin use.    Bee Venom Swelling   Benadryl [Diphenhydramine]     Heavy periods    Effexor [Venlafaxine] Hives   Lactose Intolerance (Gi)     Stomach pain   Nortriptyline Hives   Nsaids     Make colitis worse   Other Diarrhea    Red meat - stomach pains, bleeding     Current Outpatient Medications  Medication Sig Dispense Refill   acetaminophen (TYLENOL) 325 MG tablet Take 650 mg by mouth every 6 (six) hours as needed.     albuterol (VENTOLIN HFA) 108 (90  Base) MCG/ACT inhaler Inhale 1-2 puffs into the lungs every 4 (four) hours as needed for wheezing. 2 each 11   alendronate (FOSAMAX) 70 MG tablet TAKE 1 TABLET BY MOUTH ONCE A WEEK. TAKE WITH A FULL GLASS OF WATER ON AN EMPTY STOMACH. 12 tablet 0   cefUROXime (CEFTIN) 500 MG tablet Take 1 tablet (500 mg total) by mouth 2 (two) times daily with a meal. 14 tablet 0   Erenumab-aooe (AIMOVIG) 140 MG/ML SOAJ Inject 140 mg into the skin every 28 (twenty-eight) days. 1.12 mL 5   hydroxychloroquine (PLAQUENIL) 200 MG tablet Take 200mg  by mouth Monday through Friday only. None on Saturday or Sunday. 60 tablet 0   ibuprofen (ADVIL) 800 MG tablet Take 1 tablet (800 mg total) by mouth every 8 (eight) hours as needed. Take after a meal. 30 tablet 5   levocetirizine (XYZAL) 5 MG tablet Take 1 tablet (5 mg total) by mouth every evening. 30 tablet 6   meclizine (ANTIVERT) 25 MG tablet Take 1 tablet (25 mg total) by mouth 3 (three) times daily as needed for dizziness (VERTIGO). 30 tablet 11   Melatonin 5 MG CAPS Take by mouth at bedtime.     mometasone (NASONEX) 50 MCG/ACT nasal spray One spray in each nostril twice a day, use left hand for right nostril, and right hand for left nostril.  Please dispense one bottle. 1 g 6   Rimegepant Sulfate (NURTEC) 75 MG TBDP Take 75 mg by mouth daily as needed. 8 tablet 5   EPINEPHRINE 0.3 mg/0.3 mL IJ SOAJ injection INJECT 0.3 MLS (0.3 MG TOTAL) INTO THE MUSCLE AS NEEDED FOR ANAPHYLAXIS. (Patient not taking: Reported on 03/22/2022) 2 each 2   nitrofurantoin, macrocrystal-monohydrate, (MACROBID) 100 MG capsule Take 1 capsule (100 mg total) by mouth 2 (two) times daily. (Patient not taking: Reported on 03/16/2022) 14 capsule 1   ondansetron (ZOFRAN ODT) 4 MG disintegrating tablet Take 1 tablet (4 mg total) by mouth every 8 (eight) hours as needed for nausea or vomiting. (Patient not taking: Reported on 03/22/2022) 20 tablet 5   Prenat w/o A-FE-Methfol-FA-DHA (PNV-DHA) 27-0.6-0.4-300 MG  CAPS TAKE 1 CAPSULE BY MOUTH EVERY DAY BEFORE BREAKFAST (Patient not taking: Reported on 03/22/2022) 90 capsule 4   Vitamin D, Ergocalciferol, (DRISDOL) 1.25 MG (50000 UNIT) CAPS capsule TAKE 1 CAPSULE BY MOUTH EVERY 7 DAYS FOR 6 WEEKS. (Patient not taking: Reported on 03/07/2022) 6 capsule 0   Vitamin D, Ergocalciferol, (DRISDOL) 1.25 MG (50000 UNIT) CAPS capsule Take 1 capsule (50,000 Units total)  by mouth every 7 (seven) days. (Patient not taking: Reported on 03/22/2022) 5 capsule 0   No current facility-administered medications for this visit.    Review of Systems Review of Systems Constitutional: negative for fatigue and weight loss Respiratory: negative for cough and wheezing Cardiovascular: negative for chest pain, fatigue and palpitations Gastrointestinal: negative for abdominal pain and change in bowel habits Genitourinary:positive for pelvic pain Integument/breast: negative for nipple discharge Musculoskeletal:negative for myalgias Neurological: negative for gait problems and tremors Behavioral/Psych: negative for abusive relationship, depression Endocrine: negative for temperature intolerance      Pulse 60, weight 164 lb (74.4 kg), last menstrual period 07/20/2018.  Physical Exam Physical Exam General:   Alert and no distress  Skin:   no rash or abnormalities  Lungs:   clear to auscultation bilaterally  Heart:   regular rate and rhythm, S1, S2 normal, no murmur, click, rub or gallop  Pelvic exam:  Deferred  I have spent a total of 15 minutes of face-to-face ime, excluding clinical staff time, reviewing notes and preparing to see patient, ordering tests and/or medications, and counseling the patient.   Data Reviewed Ultrasound Bone Density    US PELVIC COMPLETE WITH TRANSVAGINAL (Accession [id number]) (Order [id number]) Imaging Date: 03/08/2022 Department: [facility] Released By: Marolyn Haller Authorizing: Brock Bad, MD   Exam  Status  Status  Final [99]   PACS Intelerad Image Link   Show images for US PELVIC COMPLETE WITH TRANSVAGINAL Study Result  Narrative & Impression  CLINICAL DATA:  Pelvic pain for 1 year worse in past week, G4P3, postmenopausal   EXAM: TRANSABDOMINAL AND TRANSVAGINAL ULTRASOUND OF PELVIS   TECHNIQUE: Both transabdominal and transvaginal ultrasound examinations of the pelvis were performed. Transabdominal technique was performed for global imaging of the pelvis including uterus, ovaries, adnexal regions, and pelvic cul-de-sac. It was necessary to proceed with endovaginal exam following the transabdominal exam to visualize the uterus, endometrium, and ovaries.   COMPARISON:  07/31/2021   FINDINGS: Uterus   Measurements: 5.1 x 3.2 x 4.2 cm = volume: 36 mL. Retroflexed. Atrophic. Heterogeneous. No focal mass.   Endometrium   Thickness: 6 mm.  No endometrial fluid or mass   Right ovary   Measurements: 1.9 x 1.1 x 1.4 cm = volume: 1.6 mL. Normal morphology without mass   Left ovary   Measurements: 2.2 x 1.4 x 1.2 cm = volume: 1.9 mL. Normal morphology without mass   Other findings   No free pelvic fluid or adnexal masses.   IMPRESSION: No pelvic sonographic abnormalities identified.     Electronically Signed   By: Ulyses Southward M.D.   On: 03/08/2022 10:08         DG BONE DENSITY (DXA) (Accession [id number]) (Order [id number]) Imaging Date: 03/08/2022 Department: [facility] Released By: Marolyn Haller Authorizing: Brock Bad, MD   Exam Status  Status  Final [99]   PACS Intelerad Image Link   Show images for DG BONE DENSITY (DXA) Study Result  Narrative & Impression  EXAM: DUAL X-RAY ABSORPTIOMETRY (DXA) FOR BONE MINERAL DENSITY   IMPRESSION: Referring Physician:  Brock Bad Your patient completed a bone mineral density test using GE Lunar iDXA system (analysis version: 16). Technologist:  ALW PATIENT: Name: Yeraldi, Fidler Patient ID: [id number] Birth Date: 1970/11/09 Height: 63.0 in. Sex: Female Measured: 03/08/2022 Weight: 162.7 lbs. Indications: Estrogen Deficiency, History of osteoporosis, Lupus, Plaquenil, Post Menopausal, Rheumatoid Arthritis Fractures: Treatments: Vitamin D   ASSESSMENT: The  BMD measured at AP Spine L1-L4 is 0.823 g/cm2 with a T-score of -3.0. This patient is considered osteoporotic according to World Health Organization [facility]) criteria.   The scan quality is good.   Site Region Measured Date Measured Age YA BMD Significant CHANGE T-score AP Spine  L1-L4      03/08/2022    52.7         -3.0    0.823 g/cm2   DualFemur Neck Right 1/08/2020    52.9         -1.7    0.805 g/cm2   DualFemur Total Mean 1/08/2020    52.9         -1.5    0.815 g/cm2   World Health Organization [facility]) criteria for post-menopausal, Caucasian Women: Normal       T-score at or above -1 SD Osteopenia   T-score between -1 and -2.5 SD Osteoporosis T-score at or below -2.5 SD   RECOMMENDATION: 1. All patients should optimize calcium and vitamin D intake. 2. Consider FDA approved medical therapies in postmenopausal women and men aged 54 years and older, based on the following: a. A hip or vertebral (clinical or morphometric) fracture b. T-score = -2.5 at the femoral neck or spine after appropriate evaluation to exclude secondary causes c. Low bone mass (T-score between -1.0 and -2.5 at the femoral neck or spine) and a 10- year probability of a hip fracture = 3% or a 10 year probability of a major osteoporosis-related fracture = 20% based on the [place]-adapted WHO algorithm. 3. Clinician judgement and/or patient preference may indicate treatment for people with 10-year fracture probabilities above or below these levels.   FOLLOW-UP: Patients with diagnosis of osteoporosis or at high risk for fracture should have regular bone mineral density tests. For patients eligible  for Medicare routine testing is allowed once every 2 years. The testing frequency can be increased to one year for patients who have rapidly progressing disease, those who are receiving or discontinuing medical therapy to restore bone mass, or have additional risk factors.   I have reviewed this study and agree with the findings. [facility]     Electronically Signed   By: Frederico Hamman M.D.   On: 03/08/2022 10:46           Contains abnormal data Urine Culture Order: [id number] Status: Final result     Visible to patient: Yes (seen)     Next appt: 03/23/2022 at 08:50 AM in Neurology (Adam Gus Rankin, DO)     Dx: Dysuria   Specimen Information: Urine  Specimen Comment: UR  2 Result Notes    Component 2 wk ago  Urine Culture, Routine Final report Abnormal   Organism ID, Bacteria Comment Abnormal   Comment: Streptococcus anginosus group 10,000-25,000 colony forming units per mL Susceptibility not normally performed on this organism.  Resulting Agency [id number]       Assessment     1. Pelvic pain - normal ultrasound.  Probable MSK spasm.  R/O IBS. - Ibuprofen prn  2. Postmenopausal - stable  3. Acute cystitis with hematuria - resolved after treatment with Ceftin  4. Osteoporosis without current pathological fracture, unspecified osteoporosis type - she will discuss treatment options with PCP - taking  Vitamin D.  Was tried on Fosamax, but did not tolerate side-effects    Plan   Follow up prn   Brock Bad, MD 03/22/2022 10:49 AM

## 2022-03-23 ENCOUNTER — Ambulatory Visit: Payer: Medicaid Other | Admitting: Neurology

## 2022-03-28 ENCOUNTER — Other Ambulatory Visit (HOSPITAL_COMMUNITY): Payer: Self-pay

## 2022-03-28 NOTE — Telephone Encounter (Signed)
Faxed Prior Auth to alternate number provided by representative. 2nd attempt at Medical Pre-cert for Reclast Fax# [phone]. Turn around time for Urgent request is 24-72 hours per rep.

## 2022-03-28 NOTE — Telephone Encounter (Signed)
Will follow up this morning

## 2022-04-13 ENCOUNTER — Other Ambulatory Visit: Payer: Self-pay | Admitting: Pharmacist

## 2022-04-13 DIAGNOSIS — M810 Age-related osteoporosis without current pathological fracture: Secondary | ICD-10-CM

## 2022-04-13 NOTE — Telephone Encounter (Signed)
Called Medicaid [employer] for update on Reclast pre-certification. Patient does not.  Per rep, [id number] does not require pre-certification. Medicaid covers 100% of cost of infusion.   Phone: [phone], ext pharmacy [phone] Call ref # [id number]  Reclast order placed with [facility].  Chesley Mires, PharmD, MPH, BCPS, CPP Clinical Pharmacist (Rheumatology and Pulmonology)

## 2022-04-13 NOTE — Progress Notes (Signed)
Patient is a new start to RECLAST infusion. Diagnosis:  Dose: 5 mg IV every 12 months  Last Clinic Visit: 03/16/22 Next Clinic Visit: 06/18/22  Labs: CBC and CMP on 03/16/22; cakcium wnl, Vitamin D wnl  Orders placed for Reclast IV x 1 doses along with premedication of acetaminophen and diphenhydramine to be administered 30 minutes before medication infusion.  Called patient and provided with phone number for: [facility] [phone]) Ginette Otto  Will follow-up to ensured scheduled and completed  Chesley Mires, PharmD, MPH, BCPS, CPP Clinical Pharmacist (Rheumatology and Pulmonology)

## 2022-04-24 ENCOUNTER — Encounter: Payer: Self-pay | Admitting: Pharmacist

## 2022-04-24 NOTE — Progress Notes (Deleted)
NEUROLOGY FOLLOW UP OFFICE NOTE  Danielle Holden [id number]  Assessment/Plan:   1.  Migraine without aura, without status migrainosus, not intractable 2.  Migrainous vertigo   1.  Migraine prevention:  Aimovig 140mg  every 28 days. 3.  Migraine rescue: Nurtec.   4.  Ondansetron for nausea 5.  Limit use of pain relievers to no more than 2 days out of week to prevent risk of rebound or medication-overuse headache. 6.  Keep headache diary 7.  Follow up in ***.     Subjective:  Danielle Holden is a 51 year old female who follows up for migraines.  UPDATE: Started Aimovig.  Nurtec *** Intensity:  moderate to severe Duration:  all day Frequency:  has a dull underlying almost daily headache but migraine headaches occur 14-15 days a month. Frequency of abortive medication: ibuprofen daily Frequency of analgesics:   Current NSAIDS/analgesics:  ibuprofen 200mg  Current triptans:  none Current ergotamine:  none Current anti-emetic:  zofran Current muscle relaxants:  none Current Antihypertensive medications:  none Current Antidepressant medications:  none Current Anticonvulsant medications:  none Current anti-CGRP:  none Current Vitamins/Herbal/Supplements:  MVI, D, prenatal, melatonin Current Antihistamines/Decongestants:  Flonase, Claritin, meclizine Other therapy:  none Hormone/birth control:  none  Caffeine:  1 cup of diluted coffee daily. No soda Diet:  Drinks 3-4 16 oz bottles of water daily.  Lactose intolerant Exercise:  Walking Depression:  no; Anxiety:  yes Other pain:  She has chronic pain with lupus Other autoimmune labs, including sed rate, were unremarkable. Sleep hygiene:  Poor.  Trouble staying asleep.  3 to 4 hours a night.  Just started melatonin  HISTORY:  She started getting migraines at 51 years old.  Sometimes they are mild but other times may be severe.  Either temple, bilateral occipital, bifrontal, neck, pounding.  Associated blurred vision and  spots, otalgia, vertigo, slurred speech, nausea, photophobia, phonophobia, osmophobia, face flushed.  Usually lays down to rest in dark room and may last 1-2 hours.  If she cannot rest, 3-4 hours.  Occurs every other day.  Triggers included seasonal allergies, loud noise and caffeine withdrawal.  Treats with Tylenol.   Has lupus with chronic pain syndrome.  Autoimmune workup otherwise unremarkable.  She has chronic unexplained right sided numbness of face, arm and leg previously worked up.  MRI of brain with and without contrast and MRA of head on 06/28/2013 to evaluate, were personally reviewed, and were unremarkable.    Past NSAIDS/analgesics:  Norco, naproxen, tramadol, Tylenol Past abortive triptans:  Rizatriptan, sumatriptan tab Past abortive ergotamine:  none Past muscle relaxants:  Flexeril Past anti-emetic:  Zofran Past antihypertensive medications:  propranolol Past antidepressant medications:  venlafaxine Past anticonvulsant medications:  topiramate Past anti-CGRP:  Ubrelvy (caused nausea) Past vitamins/Herbal/Supplements:  none Past antihistamines/decongestants:  Benadryl Other past therapies:  none    Family history of headache:  unknown  PAST MEDICAL HISTORY: Past Medical History:  Diagnosis Date   Abnormal uterine bleeding    Abnormal uterine bleeding (AUB) 07/13/2013   Anxiety    Arthritis    hands, knees, shoulder, back    Asthma    Asthma in adult, mild persistent, uncomplicated 05/29/2018   Chest pain 06/27/2013   Colitis    History of   Complication of anesthesia 01/1992   During tubal ligation heart stopped and she stopped breathing   Depression    Environmental and seasonal allergies    Fatigue    Gallstones    GERD (gastroesophageal reflux  disease)    Heart rate slow    per watch   History of kidney stones    History of kidney stones 05/29/2018   History of shingles    Hypoglycemia    IBS (irritable bowel syndrome)    Iron deficiency anemia    receive  iron infusion   Lactose intolerance    Lumbar herniated disc    per patient   Lupus (HCC)    Migraine    Migraine without aura and without status migrainosus, not intractable 07/26/2014   Migraine, unspecified, without mention of intractable migraine without mention of status migrainosus 09/28/2013   Numbness and tingling of both upper extremities    first thing in the morning when she wakes up   Numbness and tingling of lower extremity    first thing in the morning when she wakes up   Osteoporosis    per patient, DEXA in 07/2019   RA (rheumatoid arthritis) (HCC)    Ventral hernia    Vertigo     MEDICATIONS: Current Outpatient Medications on File Prior to Visit  Medication Sig Dispense Refill   acetaminophen (TYLENOL) 325 MG tablet Take 650 mg by mouth every 6 (six) hours as needed.     albuterol (VENTOLIN HFA) 108 (90 Base) MCG/ACT inhaler Inhale 1-2 puffs into the lungs every 4 (four) hours as needed for wheezing. 2 each 11   alendronate (FOSAMAX) 70 MG tablet TAKE 1 TABLET BY MOUTH ONCE A WEEK. TAKE WITH A FULL GLASS OF WATER ON AN EMPTY STOMACH. 12 tablet 0   cefUROXime (CEFTIN) 500 MG tablet Take 1 tablet (500 mg total) by mouth 2 (two) times daily with a meal. 14 tablet 0   EPINEPHRINE 0.3 mg/0.3 mL IJ SOAJ injection INJECT 0.3 MLS (0.3 MG TOTAL) INTO THE MUSCLE AS NEEDED FOR ANAPHYLAXIS. (Patient not taking: Reported on 03/22/2022) 2 each 2   Erenumab-aooe (AIMOVIG) 140 MG/ML SOAJ Inject 140 mg into the skin every 28 (twenty-eight) days. 1.12 mL 5   hydroxychloroquine (PLAQUENIL) 200 MG tablet Take 200mg  by mouth Monday through Friday only. None on Saturday or Sunday. 60 tablet 0   ibuprofen (ADVIL) 800 MG tablet Take 1 tablet (800 mg total) by mouth every 8 (eight) hours as needed. Take after a meal. 30 tablet 5   levocetirizine (XYZAL) 5 MG tablet Take 1 tablet (5 mg total) by mouth every evening. 30 tablet 6   meclizine (ANTIVERT) 25 MG tablet Take 1 tablet (25 mg total) by mouth  3 (three) times daily as needed for dizziness (VERTIGO). 30 tablet 11   Melatonin 5 MG CAPS Take by mouth at bedtime.     mometasone (NASONEX) 50 MCG/ACT nasal spray One spray in each nostril twice a day, use left hand for right nostril, and right hand for left nostril.  Please dispense one bottle. 1 g 6   nitrofurantoin, macrocrystal-monohydrate, (MACROBID) 100 MG capsule Take 1 capsule (100 mg total) by mouth 2 (two) times daily. (Patient not taking: Reported on 03/16/2022) 14 capsule 1   ondansetron (ZOFRAN ODT) 4 MG disintegrating tablet Take 1 tablet (4 mg total) by mouth every 8 (eight) hours as needed for nausea or vomiting. (Patient not taking: Reported on 03/22/2022) 20 tablet 5   Prenat w/o A-FE-Methfol-FA-DHA (PNV-DHA) 27-0.6-0.4-300 MG CAPS TAKE 1 CAPSULE BY MOUTH EVERY DAY BEFORE BREAKFAST (Patient not taking: Reported on 03/22/2022) 90 capsule 4   Rimegepant Sulfate (NURTEC) 75 MG TBDP Take 75 mg by mouth daily as needed. 8  tablet 5   Vitamin D, Ergocalciferol, (DRISDOL) 1.25 MG (50000 UNIT) CAPS capsule TAKE 1 CAPSULE BY MOUTH EVERY 7 DAYS FOR 6 WEEKS. (Patient not taking: Reported on 03/07/2022) 6 capsule 0   Vitamin D, Ergocalciferol, (DRISDOL) 1.25 MG (50000 UNIT) CAPS capsule Take 1 capsule (50,000 Units total) by mouth every 7 (seven) days. (Patient not taking: Reported on 03/22/2022) 5 capsule 0   No current facility-administered medications on file prior to visit.    ALLERGIES: Allergies  Allergen Reactions   Anesthetics, Amide Anaphylaxis and Other (See Comments)    Heart stopped and stopped breathing   Benzocaine Anaphylaxis   Black Pepper [Piper] Anaphylaxis    Throat swelling   Mushroom Extract Complex Anaphylaxis    Mouth swelling   Trazodone And Nefazodone Anaphylaxis    Tongue swells.   Amoxicillin Hives and Diarrhea    Has patient had a PCN reaction causing immediate rash, facial/tongue/throat swelling, SOB or lightheadedness with hypotension: No Has patient had a  PCN reaction causing severe rash involving mucus membranes or skin necrosis: Yes Has patient had a PCN reaction that required hospitalization: Yes Has patient had a PCN reaction occurring within the last 10 years: Yes If all of the above answers are "NO", then may proceed with Cephalosporin use.    Bee Venom Swelling   Benadryl [Diphenhydramine]     Heavy periods    Effexor [Venlafaxine] Hives   Lactose Intolerance (Gi)     Stomach pain   Nortriptyline Hives   Nsaids     Make colitis worse   Other Diarrhea    Red meat - stomach pains, bleeding     FAMILY HISTORY: Family History  Problem Relation Age of Onset   Suicidality Father    Diabetes Maternal Grandmother    Breast cancer Maternal Grandmother    Cervical cancer Paternal Grandmother    Heart defect Son    Healthy Son    Healthy Daughter       Objective:  *** General: No acute distress.  Patient appears well-groomed.   Head:  Normocephalic/atraumatic Eyes:  Fundi examined but not visualized Neck: supple, no paraspinal tenderness, full range of motion Heart:  Regular rate and rhythm Neurological Exam: ***   Shon Millet, DO  CC: Enid Skeens, NP

## 2022-04-24 NOTE — Progress Notes (Signed)
MyChrat message sent to patient as reminder for Reclast scheduling  Chesley Mires, PharmD, MPH, BCPS, CPP Clinical Pharmacist (Rheumatology and Pulmonology)

## 2022-04-25 ENCOUNTER — Ambulatory Visit: Payer: Medicaid Other | Admitting: Neurology

## 2022-04-30 ENCOUNTER — Inpatient Hospital Stay (HOSPITAL_BASED_OUTPATIENT_CLINIC_OR_DEPARTMENT_OTHER): Admission: RE | Admit: 2022-04-30 | Payer: Medicaid Other | Source: Ambulatory Visit | Admitting: Radiology

## 2022-05-01 NOTE — Progress Notes (Signed)
Another MyChart message sent to patient regarding Reclast  Chesley Mires, PharmD, MPH, BCPS, CPP Clinical Pharmacist (Rheumatology and Pulmonology)

## 2022-05-11 ENCOUNTER — Ambulatory Visit (HOSPITAL_BASED_OUTPATIENT_CLINIC_OR_DEPARTMENT_OTHER)
Admission: RE | Admit: 2022-05-11 | Discharge: 2022-05-11 | Disposition: A | Discharge status: Home / Self Care | Payer: Medicaid Other | Source: Ambulatory Visit | Attending: Obstetrics | Admitting: Obstetrics

## 2022-05-11 DIAGNOSIS — Z1239 Encounter for other screening for malignant neoplasm of breast: Secondary | ICD-10-CM

## 2022-05-11 DIAGNOSIS — Z1231 Encounter for screening mammogram for malignant neoplasm of breast: Secondary | ICD-10-CM | POA: Insufficient documentation

## 2022-05-24 ENCOUNTER — Other Ambulatory Visit: Payer: Self-pay | Admitting: Obstetrics

## 2022-05-24 DIAGNOSIS — E569 Vitamin deficiency, unspecified: Secondary | ICD-10-CM

## 2022-06-04 NOTE — Progress Notes (Deleted)
Office Visit Note  Patient: Danielle Holden             Date of Birth: Sep 18, 1970           MRN: [id number]             PCP: Orma Render, NP Referring: Orma Render, NP Visit Date: 06/18/2022 Occupation: '@GUAROCC'$ @  Subjective:  No chief complaint on file.   History of Present Illness: Danielle Holden is a 52 y.o. female ***    Fosamax '70mg'$  by mouth once weekly. DEXA from July 31, 2019 with T score of -2.6.  March 08, 2022 DEXA scan showed T score of -3.0, BMD 0.823 in the AP spine.  Patient states she is not able to take Fosamax on a regular basis as it causes reflux symptoms.   Discussed IV reclast   Activities of Daily Living:  Patient reports morning stiffness for *** {minute/hour:19697}.   Patient {ACTIONS;DENIES/REPORTS:21021675::"Denies"} nocturnal pain.  Difficulty dressing/grooming: {ACTIONS;DENIES/REPORTS:21021675::"Denies"} Difficulty climbing stairs: {ACTIONS;DENIES/REPORTS:21021675::"Denies"} Difficulty getting out of chair: {ACTIONS;DENIES/REPORTS:21021675::"Denies"} Difficulty using hands for taps, buttons, cutlery, and/or writing: {ACTIONS;DENIES/REPORTS:21021675::"Denies"}  No Rheumatology ROS completed.   PMFS History:  Patient Active Problem List   Diagnosis Date Noted   Patellofemoral pain syndrome of right knee 12/12/2021   DDD (degenerative disc disease), cervical 09/11/2021   Scoliosis 09/11/2021   Other systemic lupus erythematosus with other organ involvement ([place]) 09/01/2021   Asthma due to seasonal allergies 09/01/2021   Insomnia 09/01/2021   Myofascial pain 05/29/2018   History of IBS 05/29/2018   History of gastroesophageal reflux (GERD) 05/29/2018   History of TIA (transient ischemic attack) 05/29/2018   History of rheumatoid arthritis 10/25/2016   Seasonal allergies 10/25/2016   Ventral hernia 10/25/2016   Vertigo 10/25/2016   Fibromyalgia 03/05/2016   Other specified abnormal immunological findings in serum 03/05/2016    Raynaud's disease without gangrene 03/05/2016   Generalized anxiety disorder 07/26/2014   Hx of migraines 01/27/2014   Iron deficiency anemia 06/30/2013   Pain in joints 06/04/2013   Herpes zoster 02/22/2012    Past Medical History:  Diagnosis Date   Abnormal uterine bleeding    Abnormal uterine bleeding (AUB) 07/13/2013   Anxiety    Arthritis    hands, knees, shoulder, back    Asthma    Asthma in adult, mild persistent, uncomplicated [id number]   Chest pain 06/27/2013   Colitis    History of   Complication of anesthesia 01/1992   During tubal ligation heart stopped and she stopped breathing   Depression    Environmental and seasonal allergies    Fatigue    Gallstones    GERD (gastroesophageal reflux disease)    Heart rate slow    per watch   History of kidney stones    History of kidney stones 05/29/2018   History of shingles    Hypoglycemia    IBS (irritable bowel syndrome)    Iron deficiency anemia    receive iron infusion   Lactose intolerance    Lumbar herniated disc    per patient   Lupus ([place])    Migraine    Migraine without aura and without status migrainosus, not intractable 07/26/2014   Migraine, unspecified, without mention of intractable migraine without mention of status migrainosus 09/28/2013   Numbness and tingling of both upper extremities    first thing in the morning when she wakes up   Numbness and tingling of lower extremity    first thing in the  morning when she wakes up   Osteoporosis    per patient, DEXA in 07/2019   RA (rheumatoid arthritis) ([place])    Ventral hernia    Vertigo     Family History  Problem Relation Age of Onset   Suicidality Father    Diabetes Maternal Grandmother    Breast cancer Maternal Grandmother    Cervical cancer Paternal Grandmother    Heart defect Son    Healthy Son    Healthy Daughter    Past Surgical History:  Procedure Laterality Date   APPENDECTOMY     CESAREAN SECTION     CHOLECYSTECTOMY [name] 08/07/2017    Procedure: LAPAROSCOPIC CHOLECYSTECTOMY, PRIMARY REPAIR OF UMBILICAL HERNIA;  Surgeon: Ileana Roup, MD;  Location: [facility];  Service: General;  Laterality: [name];   COLONOSCOPY     IUD REMOVAL [name] 11/12/2014   TUBAL LIGATION     UMBILICAL HERNIA REPAIR     Social History   Social History Narrative   Patient lives at home alone and she is disabled.   Education 9th grade.   Right handed.   Caffeine one cup of coffee not daily.    Immunization History  Administered Date(s) Administered   Influenza,inj,Quad PF,6+ Mos 03/06/2018, 02/11/2019   Influenza-Unspecified 03/06/2018, 02/11/2019, 01/13/2020, 01/13/2020, 02/22/2021   PFIZER(Purple Top)SARS-COV-2 Vaccination 08/01/2019, 08/22/2019, 03/04/2020   Tetanus 01/13/2020     Objective: Vital Signs: LMP 07/20/2018 (Exact Date)    Physical Exam   Musculoskeletal Exam: ***  CDAI Exam: CDAI Score: -- Patient Global: --; Provider Global: -- Swollen: --; Tender: -- Joint Exam 06/18/2022   No joint exam has been documented for this visit   There is currently no information documented on the homunculus. Go to the Rheumatology activity and complete the homunculus joint exam.  Investigation: No additional findings.  Imaging: MM 3D SCREEN BREAST BILATERAL  Result Date: 05/15/2022 CLINICAL DATA:  Screening. EXAM: DIGITAL SCREENING BILATERAL MAMMOGRAM WITH TOMOSYNTHESIS AND CAD TECHNIQUE: Bilateral screening digital craniocaudal and mediolateral oblique mammograms were obtained. Bilateral screening digital breast tomosynthesis was performed. The images were evaluated with computer-aided detection. COMPARISON:  Previous exam(s). ACR Breast Density Category c: The breast tissue is heterogeneously dense, which may obscure small masses. FINDINGS: There are no findings suspicious for malignancy. IMPRESSION: No mammographic evidence of malignancy. A result letter of this screening mammogram will be mailed directly to the patient.  RECOMMENDATION: Screening mammogram in one year. (Code:SM-B-01Y) BI-RADS CATEGORY  1: Negative. Electronically Signed   By: Marin Olp M.D.   On: 05/15/2022 12:01    Recent Labs: Lab Results  Component Value Date   WBC 6.1 03/16/2022   HGB 14.7 03/16/2022   PLT 250 03/16/2022   NA 142 03/16/2022   K 4.0 03/16/2022   CL 107 03/16/2022   CO2 26 03/16/2022   GLUCOSE 77 03/16/2022   BUN 8 03/16/2022   CREATININE 0.64 03/16/2022   BILITOT 0.4 03/16/2022   ALKPHOS 125 (H) 07/26/2021   AST 16 03/16/2022   ALT 12 03/16/2022   PROT 7.1 03/16/2022   ALBUMIN 5.0 (H) 07/26/2021   CALCIUM 9.1 03/16/2022   GFRAA 114 04/11/2020    Speciality Comments: Do not refill PLQ until eye exam complete, sch for 03/16/2022. Noncompliance with fu,eye exam Fosamax12/21  Procedures:  No procedures performed Allergies: Anesthetics, amide; Benzocaine; Black pepper [piper]; Mushroom extract complex; Trazodone and nefazodone; Amoxicillin; Bee venom; Benadryl [diphenhydramine]; Effexor [venlafaxine]; Lactose intolerance (gi); Nortriptyline; Nsaids; and Other   Assessment / Plan:  Visit Diagnoses: Other systemic lupus erythematosus with other organ involvement (HCC)  High risk medication use  Myofascial pain  Other fatigue  Trochanteric bursitis of both hips  DDD (degenerative disc disease), lumbar  Age-related osteoporosis without current pathological fracture  Vitamin D deficiency  History of TIA (transient ischemic attack)  Anxiety and depression  History of gastroesophageal reflux (GERD)  History of IBS  History of asthma  History of kidney stones  Hx of migraines  Orders: No orders of the defined types were placed in this encounter.  No orders of the defined types were placed in this encounter.   Face-to-face time spent with patient was *** minutes. Greater than 50% of time was spent in counseling and coordination of care.  Follow-Up Instructions: No follow-ups on  file.   Ofilia Neas, PA-C  Note - This record has been created using Dragon software.  Chart creation errors have been sought, but may not always  have been located. Such creation errors do not reflect on  the standard of medical care.

## 2022-06-18 ENCOUNTER — Ambulatory Visit: Payer: Medicaid Other | Admitting: Physician Assistant

## 2022-06-18 DIAGNOSIS — F32A Depression, unspecified: Secondary | ICD-10-CM

## 2022-06-18 DIAGNOSIS — M5136 Other intervertebral disc degeneration, lumbar region: Secondary | ICD-10-CM

## 2022-06-18 DIAGNOSIS — R5383 Other fatigue: Secondary | ICD-10-CM

## 2022-06-18 DIAGNOSIS — Z8709 Personal history of other diseases of the respiratory system: Secondary | ICD-10-CM

## 2022-06-18 DIAGNOSIS — M7918 Myalgia, other site: Secondary | ICD-10-CM

## 2022-06-18 DIAGNOSIS — M810 Age-related osteoporosis without current pathological fracture: Secondary | ICD-10-CM

## 2022-06-18 DIAGNOSIS — Z8673 Personal history of transient ischemic attack (TIA), and cerebral infarction without residual deficits: Secondary | ICD-10-CM

## 2022-06-18 DIAGNOSIS — Z8669 Personal history of other diseases of the nervous system and sense organs: Secondary | ICD-10-CM

## 2022-06-18 DIAGNOSIS — Z87442 Personal history of urinary calculi: Secondary | ICD-10-CM

## 2022-06-18 DIAGNOSIS — Z79899 Other long term (current) drug therapy: Secondary | ICD-10-CM

## 2022-06-18 DIAGNOSIS — Z8719 Personal history of other diseases of the digestive system: Secondary | ICD-10-CM

## 2022-06-18 DIAGNOSIS — M3219 Other organ or system involvement in systemic lupus erythematosus: Secondary | ICD-10-CM

## 2022-06-18 DIAGNOSIS — E559 Vitamin D deficiency, unspecified: Secondary | ICD-10-CM

## 2022-06-18 DIAGNOSIS — M7061 Trochanteric bursitis, right hip: Secondary | ICD-10-CM

## 2022-06-22 NOTE — Progress Notes (Signed)
Office Visit Note  Patient: Danielle Holden             Date of Birth: 02/12/1971           MRN: [id number]             PCP: Orma Render, NP Referring: Orma Render, NP Visit Date: 07/06/2022 Occupation: @GUAROCC$ @  Subjective:  Medication monitoring   History of Present Illness: Danielle Holden is a 52 y.o. female with history of systemic lupus erythematosus and osteoporosis.  Patient remains on plaquenil 200 mg 1 tablet by mouth daily Monday through Friday.  She updated her Plaquenil eye examination on 06/26/2022.  She continues to tolerate Plaquenil without any side effects.  She denies any symptoms of a systemic lupus flare since her last office visit.  She continues to have occasional facial rashes but tries to avoid direct sun exposure.  She denies any symptoms of Raynaud's phenomenon recently.  She has ongoing hair loss.  She denies any sores in her mouth or nose currently.  She continues to have ongoing sicca symptoms.  She continues to have ongoing pain in her lower back and feels that her scoliosis has been progressing.  She experiences intermittent discomfort in her right knee and especially has difficulty climbing steps.  She has occasional pain and swelling in her hands as well. Patient reports that she requires updated lab work prior to being able to schedule her Reclast infusion.  She would like to have lab work updated today.     Activities of Daily Living:  Patient reports morning stiffness for 1 hour.   Patient Reports nocturnal pain.  Difficulty dressing/grooming: Reports Difficulty climbing stairs: Reports Difficulty getting out of chair: Denies Difficulty using hands for taps, buttons, cutlery, and/or writing: Reports  Review of Systems  Constitutional:  Positive for fatigue.  HENT:  Negative for mouth sores, mouth dryness and nose dryness.   Eyes:  Positive for dryness. Negative for pain and visual disturbance.  Respiratory:  Negative for cough,  hemoptysis, shortness of breath and difficulty breathing.   Cardiovascular:  Negative for chest pain, palpitations, hypertension and swelling in legs/feet.  Gastrointestinal:  Positive for diarrhea (Colitis). Negative for constipation.  Endocrine: Positive for increased urination.  Genitourinary:  Positive for involuntary urination. Negative for painful urination.  Musculoskeletal:  Positive for joint pain, gait problem, joint pain, joint swelling, myalgias, muscle weakness, morning stiffness, muscle tenderness and myalgias.  Skin:  Positive for rash, hair loss and sensitivity to sunlight. Negative for color change, pallor, nodules/bumps, skin tightness and ulcers.  Allergic/Immunologic: Positive for susceptible to infections.  Neurological:  Positive for dizziness and headaches. Negative for numbness and weakness.  Hematological:  Positive for swollen glands.  Psychiatric/Behavioral:  Positive for sleep disturbance. Negative for depressed mood. The patient is nervous/anxious.     PMFS History:  Patient Active Problem List   Diagnosis Date Noted   Patellofemoral pain syndrome of right knee 12/12/2021   DDD (degenerative disc disease), cervical 09/11/2021   Scoliosis 09/11/2021   Other systemic lupus erythematosus with other organ involvement ([place]) 09/01/2021   Asthma due to seasonal allergies 09/01/2021   Insomnia 09/01/2021   Myofascial pain 05/29/2018   History of IBS 05/29/2018   History of gastroesophageal reflux (GERD) 05/29/2018   History of TIA (transient ischemic attack) 05/29/2018   History of rheumatoid arthritis 10/25/2016   Seasonal allergies 10/25/2016   Ventral hernia 10/25/2016   Vertigo 10/25/2016   Fibromyalgia 03/05/2016  Other specified abnormal immunological findings in serum 03/05/2016   Raynaud's disease without gangrene 03/05/2016   Generalized anxiety disorder 07/26/2014   Hx of migraines 01/27/2014   Iron deficiency anemia 06/30/2013   Pain in joints  06/04/2013   Herpes zoster 02/22/2012    Past Medical History:  Diagnosis Date   Abnormal uterine bleeding    Abnormal uterine bleeding (AUB) 07/13/2013   Anxiety    Arthritis    hands, knees, shoulder, back    Asthma    Asthma in adult, mild persistent, uncomplicated [id number]   Chest pain 06/27/2013   Colitis    History of   Complication of anesthesia 01/1992   During tubal ligation heart stopped and she stopped breathing   Depression    Environmental and seasonal allergies    Fatigue    Gallstones    GERD (gastroesophageal reflux disease)    Heart rate slow    per watch   History of kidney stones    History of kidney stones 05/29/2018   History of shingles    Hypoglycemia    IBS (irritable bowel syndrome)    Iron deficiency anemia    receive iron infusion   Lactose intolerance    Lumbar herniated disc    per patient   Lupus ([place])    Migraine    Migraine without aura and without status migrainosus, not intractable 07/26/2014   Migraine, unspecified, without mention of intractable migraine without mention of status migrainosus 09/28/2013   Numbness and tingling of both upper extremities    first thing in the morning when she wakes up   Numbness and tingling of lower extremity    first thing in the morning when she wakes up   Osteoporosis    per patient, DEXA in 07/2019   RA (rheumatoid arthritis) ([place])    Ventral hernia    Vertigo     Family History  Problem Relation Age of Onset   Suicidality Father    Diabetes Maternal Grandmother    Breast cancer Maternal Grandmother    Cervical cancer Paternal Grandmother    Heart defect Son    Healthy Son    Healthy Daughter    Past Surgical History:  Procedure Laterality Date   APPENDECTOMY     CESAREAN SECTION     CHOLECYSTECTOMY [name] 08/07/2017   Procedure: LAPAROSCOPIC CHOLECYSTECTOMY, PRIMARY REPAIR OF UMBILICAL HERNIA;  Surgeon: Ileana Roup, MD;  Location: [facility];  Service: General;  Laterality: [name];    COLONOSCOPY     IUD REMOVAL [name] 11/12/2014   TUBAL LIGATION     UMBILICAL HERNIA REPAIR     Social History   Social History Narrative   Patient lives at home alone and she is disabled.   Education 9th grade.   Right handed.   Caffeine one cup of coffee not daily.    Immunization History  Administered Date(s) Administered   Influenza,inj,Quad PF,6+ Mos 03/06/2018, 02/11/2019   Influenza-Unspecified 03/06/2018, 02/11/2019, 01/13/2020, 01/13/2020, 02/22/2021   PFIZER(Purple Top)SARS-COV-2 Vaccination 08/01/2019, 08/22/2019, 03/04/2020   Tetanus 01/13/2020     Objective: Vital Signs: BP 119/81 (BP Location: Left Arm, Patient Position: Sitting, Cuff Size: Large)   Pulse (!) 57   Resp 16   Ht 5' 3.5" (1.613 m)   Wt 167 lb (75.8 kg)   LMP 07/20/2018 (Exact Date)   BMI 29.12 kg/m    Physical Exam Vitals and nursing note reviewed.  Constitutional:      Appearance: She is well-developed.  HENT:  Head: Normocephalic and atraumatic.  Eyes:     Conjunctiva/sclera: Conjunctivae normal.  Cardiovascular:     Rate and Rhythm: Normal rate and regular rhythm.     Heart sounds: Normal heart sounds.  Pulmonary:     Effort: Pulmonary effort is normal.     Breath sounds: Normal breath sounds.  Abdominal:     General: Bowel sounds are normal.     Palpations: Abdomen is soft.  Musculoskeletal:     Cervical back: Normal range of motion.  Lymphadenopathy:     Cervical: No cervical adenopathy.  Skin:    General: Skin is warm and dry.     Capillary Refill: Capillary refill takes less than 2 seconds.  Neurological:     Mental Status: She is alert and oriented to person, place, and time.  Psychiatric:        Behavior: Behavior normal.      Musculoskeletal Exam: C-spine has good ROM. Trapezius muscle tension and tenderness bilaterally.  Painful ROM of lumbar spine. Shoulder joints, elbow joints, wrist joints, MCPs, PIPs, and DIPs good ROM with no synovitis.  Complete fist formation  bilaterally. Hip joints have good ROM with with some discomfort in the right groin.  Tenderness over the left trochanteric bursa.  Knee joints have good ROM with no warmth or effusion.  Ankle joints have good ROM with no tenderness or joint swelling.    CDAI Exam: CDAI Score: -- Patient Global: --; Provider Global: -- Swollen: --; Tender: -- Joint Exam 07/06/2022   No joint exam has been documented for this visit   There is currently no information documented on the homunculus. Go to the Rheumatology activity and complete the homunculus joint exam.  Investigation: No additional findings.  Imaging: No results found.  Recent Labs: Lab Results  Component Value Date   WBC 6.1 03/16/2022   HGB 14.7 03/16/2022   PLT 250 03/16/2022   NA 142 03/16/2022   K 4.0 03/16/2022   CL 107 03/16/2022   CO2 26 03/16/2022   GLUCOSE 77 03/16/2022   BUN 8 03/16/2022   CREATININE 0.64 03/16/2022   BILITOT 0.4 03/16/2022   ALKPHOS 125 (H) 07/26/2021   AST 16 03/16/2022   ALT 12 03/16/2022   PROT 7.1 03/16/2022   ALBUMIN 5.0 (H) 07/26/2021   CALCIUM 9.1 03/16/2022   GFRAA 114 04/11/2020    Speciality Comments: PLQ eye exam normal 06/26/2022 Dr. Katy Fitch f/u 1 year  Fosamax12/21  Procedures:  No procedures performed Allergies: Anesthetics, amide; Benzocaine; Black pepper [piper]; Mushroom extract complex; Trazodone and nefazodone; Amoxicillin; Bee venom; Benadryl [diphenhydramine]; Effexor [venlafaxine]; Lactose intolerance (gi); Nortriptyline; Nsaids; and Other   Assessment / Plan:     Visit Diagnoses: Other systemic lupus erythematosus with other organ involvement (HCC) - +ANA, +dsDNA,history of oral ulcers, arthralgias, joint swelling, photosensitivity, rash: She was treated with Plaquenil in [place]: She is not exhibiting any signs or symptoms of a systemic lupus flare at this time.  She has been taking Plaquenil 200 mg 1 tablet by mouth daily Monday through Friday.  She continues to tolerate  Plaquenil without any side effects and has not missed any doses recently.  She had an updated Plaquenil eye examination on 06/26/2022. She has not had any recent oral or nasal ulcerations.  She has ongoing sicca symptoms so the use of over-the-counter products were discussed.  She has not had any symptoms of Raynaud's phenomenon.  She has occasional facial rashes but no Malar rash currently.  Discussed the  importance of avoiding direct sun exposure.   She has started to notice increased hair loss since her last office visit.  She has no synovitis on examination today.  Lab work from 03/16/22 was reviewed today in the office: dsDNA-, complements WNL, ESR WNL, no proteinuria, CBC and CMP WNL.  Labs were not consistent with active disease or flare.  The following lab work will be obtained today for further evaluation.  She will remain on Plaquenil as prescribed.  She was advised to notify us if she develops any signs or symptoms of a systemic lupus flare.  She will follow-up in the office in 3 months or sooner if needed.  - Plan: CBC with Differential/Platelet, COMPLETE METABOLIC PANEL WITH GFR, VITAMIN D 25 Hydroxy (Vit-D Deficiency, Fractures), Anti-DNA antibody, double-stranded, C3 and C4, Sedimentation rate, Protein / creatinine ratio, urine  High risk medication use - Plaquenil 200 mg 1 tablet by mouth daily Monday through Friday.   CBC and CMP within normal limits on 03/16/2022.  Orders for CBC and CMP were released today. PLQ eye exam normal 06/26/2022 Dr. Katy Fitch f/u 1 year.  - Plan: CBC with Differential/Platelet, COMPLETE METABOLIC PANEL WITH GFR  Myofascial pain: She continues to have generalized myalgias and muscle tenderness due to myofascial pain.    Other fatigue: Chronic, stable.  Trochanteric bursitis of both hips: She is a side sleeper which exacerbates her symptoms.  She has some tenderness over the left trochanteric bursa on examination today.  Age-related osteoporosis without current  pathological fracture - Previous DEXA from July 31, 2019 with T score of -2.6.  DEXA updated on 03/08/22:  T score of -3.0, BMD 0.823 in the AP spine.  No recent falls or fractures. Patient was previously taking Fosamax 70 mg 1 tablet by mouth once weekly but had gaps in therapy due to increased symptoms of reflux.  At her last office visit the plan was to switch from Fosamax to IV Reclast.  She has been approved to initiate Reclast but requires updated lab work today.  Orders for CBC, CMP, and vitamin D were released today.  Patient plans on scheduling Reclast infusion pending lab results.  Vitamin D deficiency -Vitamin D was 40.8 on 03/07/2022.  Vitamin D will be checked today prior to scheduling the Reclast infusion.  Plan: VITAMIN D 25 Hydroxy (Vit-D Deficiency, Fractures)  DDD (degenerative disc disease), lumbar: Chronic pain.  History of scoliosis per patient. She had a CT of the lumbar spine on 11/02/2020 which revealed mild disc degeneration and bulging at L5-S1.  She continues to have ongoing discomfort in her lower back.  She has been taking Tylenol arthritis for pain relief.  Other medical conditions are listed as follows:  History of TIA (transient ischemic attack)  History of gastroesophageal reflux (GERD)  Anxiety and depression  History of IBS  History of asthma  History of kidney stones  Hx of migraines  Orders: Orders Placed This Encounter  Procedures   CBC with Differential/Platelet   COMPLETE METABOLIC PANEL WITH GFR   VITAMIN D 25 Hydroxy (Vit-D Deficiency, Fractures)   Anti-DNA antibody, double-stranded   C3 and C4   Sedimentation rate   Protein / creatinine ratio, urine   No orders of the defined types were placed in this encounter.    Follow-Up Instructions: Return in about 3 months (around 10/04/2022) for Systemic lupus erythematosus, Osteoporosis.   Ofilia Neas, PA-C  Note - This record has been created using Dragon software.  Chart creation  errors  have been sought, but may not always  have been located. Such creation errors do not reflect on  the standard of medical care.

## 2022-06-24 ENCOUNTER — Other Ambulatory Visit: Payer: Self-pay | Admitting: Obstetrics

## 2022-06-24 DIAGNOSIS — E569 Vitamin deficiency, unspecified: Secondary | ICD-10-CM

## 2022-06-26 ENCOUNTER — Other Ambulatory Visit: Payer: Self-pay | Admitting: *Deleted

## 2022-06-26 ENCOUNTER — Other Ambulatory Visit: Payer: Self-pay | Admitting: Obstetrics

## 2022-06-26 ENCOUNTER — Other Ambulatory Visit: Payer: Self-pay | Admitting: Neurology

## 2022-06-26 DIAGNOSIS — N3000 Acute cystitis without hematuria: Secondary | ICD-10-CM

## 2022-06-26 MED ORDER — HYDROXYCHLOROQUINE SULFATE 200 MG PO TABS: ORAL_TABLET | ORAL | 0 refills | Status: DC

## 2022-06-26 NOTE — Telephone Encounter (Signed)
Next Visit: 07/06/2022  Last Visit: 03/16/2022  Labs: 03/16/2022 CBC, CMP, sed rate, and urine protein creatinine ratio were normal double-stranded DNA negative, complements are normal, sed rate is normal.  Labs do not indicate an autoimmune disease flare.  Patient is supposed to get an eye examination prior to Plaquenil refill.  I would recommend reducing Plaquenil dose to 200 mg p.o. daily Monday to Friday only after the eye examination results are available.   Eye exam: 06/26/2022   Current Dose per office note 03/16/2022: hydroxychloroquine 200 mg p.o. twice daily Monday to Friday   DX: Other systemic lupus erythematosus with other organ involvement    Last Fill: 03/19/3022  Okay to refill Plaquenil?

## 2022-07-06 ENCOUNTER — Ambulatory Visit: Payer: Medicaid Other | Attending: Physician Assistant | Admitting: Physician Assistant

## 2022-07-06 ENCOUNTER — Encounter: Payer: Self-pay | Admitting: Physician Assistant

## 2022-07-06 VITALS — BP 119/81 | HR 57 | Resp 16 | Ht 63.5 in | Wt 167.0 lb

## 2022-07-06 DIAGNOSIS — Z79899 Other long term (current) drug therapy: Secondary | ICD-10-CM | POA: Diagnosis not present

## 2022-07-06 DIAGNOSIS — Z8669 Personal history of other diseases of the nervous system and sense organs: Secondary | ICD-10-CM

## 2022-07-06 DIAGNOSIS — M3219 Other organ or system involvement in systemic lupus erythematosus: Secondary | ICD-10-CM

## 2022-07-06 DIAGNOSIS — F32A Depression, unspecified: Secondary | ICD-10-CM

## 2022-07-06 DIAGNOSIS — M7062 Trochanteric bursitis, left hip: Secondary | ICD-10-CM

## 2022-07-06 DIAGNOSIS — E559 Vitamin D deficiency, unspecified: Secondary | ICD-10-CM

## 2022-07-06 DIAGNOSIS — M7061 Trochanteric bursitis, right hip: Secondary | ICD-10-CM

## 2022-07-06 DIAGNOSIS — Z8709 Personal history of other diseases of the respiratory system: Secondary | ICD-10-CM

## 2022-07-06 DIAGNOSIS — R5383 Other fatigue: Secondary | ICD-10-CM | POA: Diagnosis not present

## 2022-07-06 DIAGNOSIS — Z8673 Personal history of transient ischemic attack (TIA), and cerebral infarction without residual deficits: Secondary | ICD-10-CM | POA: Diagnosis not present

## 2022-07-06 DIAGNOSIS — M810 Age-related osteoporosis without current pathological fracture: Secondary | ICD-10-CM | POA: Diagnosis not present

## 2022-07-06 DIAGNOSIS — M5136 Other intervertebral disc degeneration, lumbar region: Secondary | ICD-10-CM

## 2022-07-06 DIAGNOSIS — F419 Anxiety disorder, unspecified: Secondary | ICD-10-CM | POA: Diagnosis not present

## 2022-07-06 DIAGNOSIS — Z8719 Personal history of other diseases of the digestive system: Secondary | ICD-10-CM | POA: Diagnosis not present

## 2022-07-06 DIAGNOSIS — M7918 Myalgia, other site: Secondary | ICD-10-CM

## 2022-07-06 DIAGNOSIS — Z87442 Personal history of urinary calculi: Secondary | ICD-10-CM

## 2022-07-07 LAB — C3 AND C4
C3 Complement: 160 mg/dL (ref 83–193)
C4 Complement: 44 mg/dL (ref 15–57)

## 2022-07-07 LAB — COMPLETE METABOLIC PANEL WITH GFR
AG Ratio: 1.5 (calc) (ref 1.0–2.5)
ALT: 10 U/L (ref 6–29)
AST: 17 U/L (ref 10–35)
Albumin: 4.3 g/dL (ref 3.6–5.1)
Alkaline phosphatase (APISO): 99 U/L (ref 37–153)
BUN: 9 mg/dL (ref 7–25)
CO2: 25 mmol/L (ref 20–32)
Calcium: 9 mg/dL (ref 8.6–10.4)
Chloride: 106 mmol/L (ref 98–110)
Creat: 0.71 mg/dL (ref 0.50–1.03)
Globulin: 2.9 g/dL (calc) (ref 1.9–3.7)
Glucose, Bld: 78 mg/dL (ref 65–99)
Potassium: 3.8 mmol/L (ref 3.5–5.3)
Sodium: 142 mmol/L (ref 135–146)
Total Bilirubin: 0.3 mg/dL (ref 0.2–1.2)
Total Protein: 7.2 g/dL (ref 6.1–8.1)
eGFR: 102 mL/min/{1.73_m2} (ref >=60)

## 2022-07-07 LAB — ANTI-DNA ANTIBODY, DOUBLE-STRANDED: ds DNA Ab: <1 IU/mL

## 2022-07-07 LAB — CBC WITH DIFFERENTIAL/PLATELET
Absolute Monocytes: 468 cells/uL (ref 200–950)
Basophils Absolute: 52 cells/uL (ref 0–200)
Basophils Relative: 0.8 %
Eosinophils Absolute: 280 cells/uL (ref 15–500)
Eosinophils Relative: 4.3 %
HCT: 43.8 % (ref 35.0–45.0)
Hemoglobin: 14.3 g/dL (ref 11.7–15.5)
Lymphs Abs: 2080 cells/uL (ref 850–3900)
MCH: 31.3 pg (ref 27.0–33.0)
MCHC: 32.6 g/dL (ref 32.0–36.0)
MCV: 95.8 fL (ref 80.0–100.0)
MPV: 10.2 fL (ref 7.5–12.5)
Monocytes Relative: 7.2 %
Neutro Abs: 3621 cells/uL (ref 1500–7800)
Neutrophils Relative %: 55.7 %
Platelets: 252 10*3/uL (ref 140–400)
RBC: 4.57 10*6/uL (ref 3.80–5.10)
RDW: 12.1 % (ref 11.0–15.0)
Total Lymphocyte: 32 %
WBC: 6.5 10*3/uL (ref 3.8–10.8)

## 2022-07-07 LAB — PROTEIN / CREATININE RATIO, URINE
Creatinine, Urine: 51 mg/dL (ref 20–275)
Protein/Creat Ratio: 98 mg/g creat (ref 24–184)
Protein/Creatinine Ratio: 0.098 mg/mg creat (ref 0.024–0.184)
Total Protein, Urine: 5 mg/dL (ref 5–24)

## 2022-07-07 LAB — VITAMIN D 25 HYDROXY (VIT D DEFICIENCY, FRACTURES): Vit D, 25-Hydroxy: 57 ng/mL (ref 30–100)

## 2022-07-07 LAB — SEDIMENTATION RATE: Sed Rate: 11 mm/h (ref 0–30)

## 2022-07-09 NOTE — Progress Notes (Signed)
CBC and CMP WNL.  ESR WNL. Protein creatinine ratio WNL.  dsDNA is negative and complements are WNL.  Labs are not consistent with a lupus flare or active disease.    Vitamin D is within the desirable range.  Continue maintenance dose of vitamin D.  Ok to proceed with reclast.

## 2022-07-11 ENCOUNTER — Encounter: Payer: Self-pay | Admitting: Pharmacist

## 2022-07-11 NOTE — Progress Notes (Signed)
Patient had updated labs for Reclast. MyChart message sent to pt for her to call [facility] and schedule appt  Knox Saliva, PharmD, MPH, BCPS, CPP Clinical Pharmacist (Rheumatology and Pulmonology)

## 2022-07-11 NOTE — Progress Notes (Signed)
Reclast order has been placed with [facility]  Knox Saliva, PharmD, MPH, BCPS, CPP Clinical Pharmacist (Rheumatology and Pulmonology)

## 2022-07-23 ENCOUNTER — Other Ambulatory Visit (HOSPITAL_BASED_OUTPATIENT_CLINIC_OR_DEPARTMENT_OTHER): Payer: Self-pay | Admitting: Nurse Practitioner

## 2022-07-23 DIAGNOSIS — J45909 Unspecified asthma, uncomplicated: Secondary | ICD-10-CM

## 2022-07-24 NOTE — Progress Notes (Signed)
Spoke w pt on phone - she states she has not had time to schedule infusion. She will call today - she requested MyChart message be sent with information as [facility]  Knox Saliva, PharmD, MPH, BCPS, CPP Clinical Pharmacist (Rheumatology and Pulmonology)

## 2022-07-30 ENCOUNTER — Telehealth: Payer: Self-pay

## 2022-07-30 NOTE — Telephone Encounter (Signed)
Patient contacted the office and left a message stating she tried to make an appointment to get the infusions. Patient states she was told they could not find her referral and they could not find what kind of infusions she needs. Patient's call back number is [phone]. Please advise. Thanks.

## 2022-07-31 ENCOUNTER — Ambulatory Visit (HOSPITAL_COMMUNITY)
Admission: RE | Admit: 2022-07-31 | Discharge: 2022-07-31 | Disposition: A | Discharge status: Home / Self Care | Payer: Medicaid Other | Source: Ambulatory Visit | Attending: Rheumatology | Admitting: Rheumatology

## 2022-07-31 DIAGNOSIS — M810 Age-related osteoporosis without current pathological fracture: Secondary | ICD-10-CM | POA: Diagnosis present

## 2022-07-31 MED ORDER — ACETAMINOPHEN 325 MG PO TABS
650.0000 mg | ORAL_TABLET | Freq: Once | ORAL | Status: AC
  Administered 2022-07-31: 650 mg via ORAL

## 2022-07-31 MED ORDER — DIPHENHYDRAMINE HCL 25 MG PO CAPS
ORAL_CAPSULE | ORAL | Status: AC
  Filled 2022-07-31: qty 1

## 2022-07-31 MED ORDER — ACETAMINOPHEN 325 MG PO TABS
ORAL_TABLET | ORAL | Status: AC
  Filled 2022-07-31: qty 2

## 2022-07-31 MED ORDER — ZOLEDRONIC ACID 5 MG/100ML IV SOLN
INTRAVENOUS | Status: AC
  Administered 2022-07-31: 5 mg via INTRAVENOUS
  Filled 2022-07-31: qty 100

## 2022-07-31 MED ORDER — DIPHENHYDRAMINE HCL 25 MG PO CAPS
25.0000 mg | ORAL_CAPSULE | Freq: Once | ORAL | Status: AC
  Administered 2022-07-31: 25 mg via ORAL

## 2022-07-31 MED ORDER — ZOLEDRONIC ACID 5 MG/100ML IV SOLN: 5.0000 mg | Freq: Once | INTRAVENOUS | Status: AC

## 2022-09-20 NOTE — Progress Notes (Deleted)
Office Visit Note  Patient: Danielle Holden             Date of Birth: [birth year]           MRN: [id number]             PCP: Tollie Eth, NP Referring: Tollie Eth, NP Visit Date: 10/04/2022 Occupation: @GUAROCC @  Subjective:  No chief complaint on file.   History of Present Illness: Danielle Holden is a 52 y.o. female ***     Activities of Daily Living:  Patient reports morning stiffness for *** {minute/hour:19697}.   Patient {ACTIONS;DENIES/REPORTS:21021675::"Denies"} nocturnal pain.  Difficulty dressing/grooming: {ACTIONS;DENIES/REPORTS:21021675::"Denies"} Difficulty climbing stairs: {ACTIONS;DENIES/REPORTS:21021675::"Denies"} Difficulty getting out of chair: {ACTIONS;DENIES/REPORTS:21021675::"Denies"} Difficulty using hands for taps, buttons, cutlery, and/or writing: {ACTIONS;DENIES/REPORTS:21021675::"Denies"}  No Rheumatology ROS completed.   PMFS History:  Patient Active Problem List   Diagnosis Date Noted   Patellofemoral pain syndrome of right knee 12/12/2021   DDD (degenerative disc disease), cervical 09/11/2021   Scoliosis 09/11/2021   Other systemic lupus erythematosus with other organ involvement (HCC) 09/01/2021   Asthma due to seasonal allergies 09/01/2021   Insomnia 09/01/2021   Myofascial pain 05/29/2018   History of IBS 05/29/2018   History of gastroesophageal reflux (GERD) 05/29/2018   History of TIA (transient ischemic attack) 05/29/2018   History of rheumatoid arthritis 10/25/2016   Seasonal allergies 10/25/2016   Ventral hernia 10/25/2016   Vertigo 10/25/2016   Fibromyalgia 03/05/2016   Other specified abnormal immunological findings in serum 03/05/2016   Raynaud's disease without gangrene 03/05/2016   Generalized anxiety disorder 07/26/2014   Hx of migraines 01/27/2014   Iron deficiency anemia 06/30/2013   Pain in joints 06/04/2013   Herpes zoster 02/22/2012    Past Medical History:  Diagnosis Date   Abnormal uterine bleeding     Abnormal uterine bleeding (AUB) 07/13/2013   Anxiety    Arthritis    hands, knees, shoulder, back    Asthma    Asthma in adult, mild persistent, uncomplicated 05/29/2018   Chest pain 06/27/2013   Colitis    History of   Complication of anesthesia 01/1992   During tubal ligation heart stopped and she stopped breathing   Depression    Environmental and seasonal allergies    Fatigue    Gallstones    GERD (gastroesophageal reflux disease)    Heart rate slow    per watch   History of kidney stones    History of kidney stones 05/29/2018   History of shingles    Hypoglycemia    IBS (irritable bowel syndrome)    Iron deficiency anemia    receive iron infusion   Lactose intolerance    Lumbar herniated disc    per patient   Lupus (HCC)    Migraine    Migraine without aura and without status migrainosus, not intractable 07/26/2014   Migraine, unspecified, without mention of intractable migraine without mention of status migrainosus 09/28/2013   Numbness and tingling of both upper extremities    first thing in the morning when she wakes up   Numbness and tingling of lower extremity    first thing in the morning when she wakes up   Osteoporosis    per patient, DEXA in 07/2019   RA (rheumatoid arthritis) (HCC)    Ventral hernia    Vertigo     Family History  Problem Relation Age of Onset   Suicidality Father    Diabetes Maternal Grandmother    Breast cancer  Maternal Grandmother    Cervical cancer Paternal Grandmother    Heart defect Son    Healthy Son    Healthy Daughter    Past Surgical History:  Procedure Laterality Date   APPENDECTOMY     CESAREAN SECTION     CHOLECYSTECTOMY [name] 08/07/2017   Procedure: LAPAROSCOPIC CHOLECYSTECTOMY, PRIMARY REPAIR OF UMBILICAL HERNIA;  Surgeon: Andria Meuse, MD;  Location: [facility];  Service: General;  Laterality: [name];   COLONOSCOPY     IUD REMOVAL [name] 11/12/2014   TUBAL LIGATION     UMBILICAL HERNIA REPAIR     Social History    Social History Narrative   Patient lives at home alone and she is disabled.   Education 9th grade.   Right handed.   Caffeine one cup of coffee not daily.    Immunization History  Administered Date(s) Administered   Influenza,inj,Quad PF,6+ Mos 03/06/2018, 02/11/2019   Influenza-Unspecified 03/06/2018, 02/11/2019, 01/13/2020, 01/13/2020, 02/22/2021   PFIZER(Purple Top)SARS-COV-2 Vaccination 08/01/2019, 08/22/2019, 03/04/2020   Tetanus 01/13/2020     Objective: Vital Signs: LMP 07/20/2018 (Exact Date)    Physical Exam   Musculoskeletal Exam: ***  CDAI Exam: CDAI Score: -- Patient Global: --; Provider Global: -- Swollen: --; Tender: -- Joint Exam 10/04/2022   No joint exam has been documented for this visit   There is currently no information documented on the homunculus. Go to the Rheumatology activity and complete the homunculus joint exam.  Investigation: No additional findings.  Imaging: No results found.  Recent Labs: Lab Results  Component Value Date   WBC 6.5 07/06/2022   HGB 14.3 07/06/2022   PLT 252 07/06/2022   NA 142 07/06/2022   K 3.8 07/06/2022   CL 106 07/06/2022   CO2 25 07/06/2022   GLUCOSE 78 07/06/2022   BUN 9 07/06/2022   CREATININE 0.71 07/06/2022   BILITOT 0.3 07/06/2022   ALKPHOS 125 (H) 07/26/2021   AST 17 07/06/2022   ALT 10 07/06/2022   PROT 7.2 07/06/2022   ALBUMIN 5.0 (H) 07/26/2021   CALCIUM 9.0 07/06/2022   GFRAA 114 04/11/2020    Speciality Comments: PLQ eye exam normal 06/26/2022 Dr. Dione Booze f/u 1 year  Fosamax12/21  Procedures:  No procedures performed Allergies: Anesthetics, amide; Benzocaine; Black pepper [piper]; Mushroom extract complex; Trazodone and nefazodone; Amoxicillin; Bee venom; Benadryl [diphenhydramine]; Effexor [venlafaxine]; Lactose intolerance (gi); Nortriptyline; Nsaids; and Other   Assessment / Plan:     Visit Diagnoses: Other systemic lupus erythematosus with other organ involvement (HCC)  High  risk medication use  Myofascial pain  Trochanteric bursitis of both hips  Other fatigue  Age-related osteoporosis without current pathological fracture  Vitamin D deficiency  DDD (degenerative disc disease), lumbar  History of TIA (transient ischemic attack)  History of gastroesophageal reflux (GERD)  Anxiety and depression  History of IBS  History of asthma  History of kidney stones  Hx of migraines  Vertigo  Orders: No orders of the defined types were placed in this encounter.  No orders of the defined types were placed in this encounter.   Face-to-face time spent with patient was *** minutes. Greater than 50% of time was spent in counseling and coordination of care.  Follow-Up Instructions: No follow-ups on file.   Gearldine Bienenstock, PA-C  Note - This record has been created using Dragon software.  Chart creation errors have been sought, but may not always  have been located. Such creation errors do not reflect on  the standard of medical  care.

## 2022-10-04 ENCOUNTER — Ambulatory Visit: Payer: Medicaid Other | Admitting: Physician Assistant

## 2022-10-04 DIAGNOSIS — R42 Dizziness and giddiness: Secondary | ICD-10-CM

## 2022-10-04 DIAGNOSIS — E559 Vitamin D deficiency, unspecified: Secondary | ICD-10-CM

## 2022-10-04 DIAGNOSIS — Z8709 Personal history of other diseases of the respiratory system: Secondary | ICD-10-CM

## 2022-10-04 DIAGNOSIS — Z8719 Personal history of other diseases of the digestive system: Secondary | ICD-10-CM

## 2022-10-04 DIAGNOSIS — M810 Age-related osteoporosis without current pathological fracture: Secondary | ICD-10-CM

## 2022-10-04 DIAGNOSIS — Z8673 Personal history of transient ischemic attack (TIA), and cerebral infarction without residual deficits: Secondary | ICD-10-CM

## 2022-10-04 DIAGNOSIS — R5383 Other fatigue: Secondary | ICD-10-CM

## 2022-10-04 DIAGNOSIS — M7061 Trochanteric bursitis, right hip: Secondary | ICD-10-CM

## 2022-10-04 DIAGNOSIS — Z87442 Personal history of urinary calculi: Secondary | ICD-10-CM

## 2022-10-04 DIAGNOSIS — M5136 Other intervertebral disc degeneration, lumbar region: Secondary | ICD-10-CM

## 2022-10-04 DIAGNOSIS — M3219 Other organ or system involvement in systemic lupus erythematosus: Secondary | ICD-10-CM

## 2022-10-04 DIAGNOSIS — M7918 Myalgia, other site: Secondary | ICD-10-CM

## 2022-10-04 DIAGNOSIS — Z79899 Other long term (current) drug therapy: Secondary | ICD-10-CM

## 2022-10-04 DIAGNOSIS — Z8669 Personal history of other diseases of the nervous system and sense organs: Secondary | ICD-10-CM

## 2022-10-04 DIAGNOSIS — F32A Depression, unspecified: Secondary | ICD-10-CM

## 2022-10-04 NOTE — Progress Notes (Unsigned)
Office Visit Note  Patient: Danielle Holden             Date of Birth: 1970/09/21           MRN: [id number]             PCP: Tollie Eth, NP Referring: Tollie Eth, NP Visit Date: 10/17/2022 Occupation: @GUAROCC @  Subjective:  Medication monitoring   History of Present Illness: Danielle Holden is a 52 y.o. female with history of systemic lupus, myofascial pain, and osteoporosis.  Patient is taking plaquenil 200 mg 1 tablet by mouth Monday through Friday only. She is tolerating plaquenil without any side effects.  Patient states that she continues to have generalized aching and stiffness involving multiple joints.  She states that at times she notices swelling in her right knee.  She has had intermittent rashes and has been using Eucerin lotion over-the-counter.  These rashes are typically self resolving.  She has been trying to avoid direct sun exposure.  She denies any sores in her mouth or nose.  She continues to have chronic sicca symptoms.  She has swollen lymph nodes intermittently. She has been taking Advil most days for pain relief. She had her first IV reclast infusion on 07/31/22.     Activities of Daily Living:  Patient reports morning stiffness for all day. Patient Reports nocturnal pain.  Difficulty dressing/grooming: Denies Difficulty climbing stairs: Reports Difficulty getting out of chair: Denies Difficulty using hands for taps, buttons, cutlery, and/or writing: Reports  Review of Systems  Constitutional:  Positive for fatigue.  HENT:  Positive for mouth dryness. Negative for mouth sores and nose dryness.   Eyes:  Negative for pain, visual disturbance and dryness.  Respiratory:  Negative for cough, hemoptysis, shortness of breath and difficulty breathing.   Cardiovascular:  Positive for palpitations. Negative for chest pain, hypertension and swelling in legs/feet.  Gastrointestinal:  Positive for diarrhea. Negative for blood in stool and constipation.   Endocrine: Positive for increased urination.  Genitourinary:  Positive for painful urination and nocturia.  Musculoskeletal:  Positive for joint pain, joint pain, joint swelling, myalgias, muscle weakness, morning stiffness, muscle tenderness and myalgias. Negative for gait problem.  Skin:  Positive for rash, hair loss and sensitivity to sunlight. Negative for color change, pallor, nodules/bumps, skin tightness and ulcers.  Allergic/Immunologic: Positive for susceptible to infections.  Neurological:  Positive for dizziness and headaches. Negative for numbness and weakness.  Hematological:  Positive for swollen glands.  Psychiatric/Behavioral:  Positive for sleep disturbance. Negative for depressed mood. The patient is nervous/anxious.     PMFS History:  Patient Active Problem List   Diagnosis Date Noted   Patellofemoral pain syndrome of right knee 12/12/2021   DDD (degenerative disc disease), cervical 09/11/2021   Scoliosis 09/11/2021   Other systemic lupus erythematosus with other organ involvement (HCC) 09/01/2021   Asthma due to seasonal allergies 09/01/2021   Insomnia 09/01/2021   Myofascial pain 05/29/2018   History of IBS 05/29/2018   History of gastroesophageal reflux (GERD) 05/29/2018   History of TIA (transient ischemic attack) 05/29/2018   History of rheumatoid arthritis 10/25/2016   Seasonal allergies 10/25/2016   Ventral hernia 10/25/2016   Vertigo 10/25/2016   Fibromyalgia 03/05/2016   Other specified abnormal immunological findings in serum 03/05/2016   Raynaud's disease without gangrene 03/05/2016   Generalized anxiety disorder 07/26/2014   Hx of migraines 01/27/2014   Iron deficiency anemia 06/30/2013   Pain in joints 06/04/2013   Herpes  zoster 02/22/2012    Past Medical History:  Diagnosis Date   Abnormal uterine bleeding    Abnormal uterine bleeding (AUB) 07/13/2013   Anxiety    Arthritis    hands, knees, shoulder, back    Asthma    Asthma in adult, mild  persistent, uncomplicated 05/29/2018   Chest pain 06/27/2013   Colitis    History of   Complication of anesthesia 01/1992   During tubal ligation heart stopped and she stopped breathing   Depression    Environmental and seasonal allergies    Fatigue    Gallstones    GERD (gastroesophageal reflux disease)    Heart rate slow    per watch   History of kidney stones    History of kidney stones 05/29/2018   History of shingles    Hypoglycemia    IBS (irritable bowel syndrome)    Iron deficiency anemia    receive iron infusion   Lactose intolerance    Lumbar herniated disc    per patient   Lupus (HCC)    Migraine    Migraine without aura and without status migrainosus, not intractable 07/26/2014   Migraine, unspecified, without mention of intractable migraine without mention of status migrainosus 09/28/2013   Numbness and tingling of both upper extremities    first thing in the morning when she wakes up   Numbness and tingling of lower extremity    first thing in the morning when she wakes up   Osteoporosis    per patient, DEXA in 07/2019   RA (rheumatoid arthritis) (HCC)    Ventral hernia    Vertigo     Family History  Problem Relation Age of Onset   Suicidality Father    Diabetes Maternal Grandmother    Breast cancer Maternal Grandmother    Cervical cancer Paternal Grandmother    Heart defect Son    Healthy Son    Healthy Daughter    Past Surgical History:  Procedure Laterality Date   APPENDECTOMY     CESAREAN SECTION     CHOLECYSTECTOMY [name] 08/07/2017   Procedure: LAPAROSCOPIC CHOLECYSTECTOMY, PRIMARY REPAIR OF UMBILICAL HERNIA;  Surgeon: Andria Meuse, MD;  Location: [facility];  Service: General;  Laterality: [name];   COLONOSCOPY     IUD REMOVAL [name] 11/12/2014   TUBAL LIGATION     UMBILICAL HERNIA REPAIR     Social History   Social History Narrative   Patient lives at home alone and she is disabled.   Education 9th grade.   Right handed.   Caffeine one cup of  coffee not daily.    Immunization History  Administered Date(s) Administered   Influenza,inj,Quad PF,6+ Mos 03/06/2018, 02/11/2019   Influenza-Unspecified 03/06/2018, 02/11/2019, 01/13/2020, 01/13/2020, 02/22/2021   PFIZER(Purple Top)SARS-COV-2 Vaccination 08/01/2019, 08/22/2019, 03/04/2020   Tetanus 01/13/2020     Objective: Vital Signs: BP 123/86 (BP Location: Left Arm, Patient Position: Sitting, Cuff Size: Normal)   Pulse 63   Resp 17   Ht 5\' 3"  (1.6 m)   Wt 166 lb 6.4 oz (75.5 kg)   LMP 07/20/2018 (Exact Date)   BMI 29.48 kg/m    Physical Exam Vitals and nursing note reviewed.  Constitutional:      Appearance: She is well-developed.  HENT:     Head: Normocephalic and atraumatic.  Eyes:     Conjunctiva/sclera: Conjunctivae normal.  Cardiovascular:     Rate and Rhythm: Normal rate and regular rhythm.     Heart sounds: Normal heart sounds.  Pulmonary:     Effort: Pulmonary effort is normal.     Breath sounds: Normal breath sounds.  Abdominal:     General: Bowel sounds are normal.     Palpations: Abdomen is soft.  Musculoskeletal:     Cervical back: Normal range of motion.  Lymphadenopathy:     Cervical: No cervical adenopathy.  Skin:    General: Skin is warm and dry.     Capillary Refill: Capillary refill takes less than 2 seconds.  Neurological:     Mental Status: She is alert and oriented to person, place, and time.  Psychiatric:        Behavior: Behavior normal.      Musculoskeletal Exam: C-spine, thoracic spine, lumbar spine have good range of motion.  Shoulder joints, elbow joints, wrist joints, MCPs, PIPs, DIPs have good range of motion with no synovitis.  Complete fist formation bilaterally.  Hip joints have good range of motion with no groin pain.  Some painful range of motion of the right knee.  No warmth or effusion noted in her knee joints currently.  Ankle joints have good range of motion with no tenderness or joint swelling.  CDAI Exam: CDAI Score:  -- Patient Global: --; Provider Global: -- Swollen: --; Tender: -- Joint Exam 10/17/2022   No joint exam has been documented for this visit   There is currently no information documented on the homunculus. Go to the Rheumatology activity and complete the homunculus joint exam.  Investigation: No additional findings.  Imaging: No results found.  Recent Labs: Lab Results  Component Value Date   WBC 6.5 07/06/2022   HGB 14.3 07/06/2022   PLT 252 07/06/2022   NA 142 07/06/2022   K 3.8 07/06/2022   CL 106 07/06/2022   CO2 25 07/06/2022   GLUCOSE 78 07/06/2022   BUN 9 07/06/2022   CREATININE 0.71 07/06/2022   BILITOT 0.3 07/06/2022   ALKPHOS 125 (H) 07/26/2021   AST 17 07/06/2022   ALT 10 07/06/2022   PROT 7.2 07/06/2022   ALBUMIN 5.0 (H) 07/26/2021   CALCIUM 9.0 07/06/2022   GFRAA 114 04/11/2020    Speciality Comments: PLQ eye exam normal 06/26/2022 Dr. Dione Booze f/u 1 year  Fosamax12/21  Procedures:  No procedures performed Allergies: Anesthetics, amide; Benzocaine; Black pepper [piper]; Mushroom extract complex; Trazodone and nefazodone; Amoxicillin; Bee venom; Benadryl [diphenhydramine]; Effexor [venlafaxine]; Lactose intolerance (gi); Nortriptyline; Nsaids; and Other      Assessment / Plan:     Visit Diagnoses: Other systemic lupus erythematosus with other organ involvement (HCC) - +ANA, +dsDNA,history of oral ulcers, arthralgias, joint swelling, photosensitivity, rash: Overall her disease remains well-controlled taking Plaquenil 200 mg 1 tablet by mouth once daily Monday through Friday.  She is tolerating plaquenil without any side effects. No synovitis was noted on examination today.  She has been taking Advil as needed for symptomatic relief. She continues to experience intermittent arthralgias, joint stiffness, and myalgias. She has been having increased sicca symptoms.  Discussed the use of refresh, Systane, Biotene, and XyliMelts available over-the-counter for  symptomatic relief.  She has not had any sores in her mouth or nose.  No cervical lymphadenopathy is palpable today.  No Maller rash noted today.  She has occasional scattered rashes so we discussed the use of cortisone cream.   Lab work from 07/06/22 was reviewed today in the office: dsDNA negative, complements WNL, ESR WNL, protein creatinine ratio WNL, vitamin D WNL, CBC and CMP WNL.  The following lab work  will be obtained today for further evaluation.  She will remain on Plaquenil as prescribed.  A refill of Plaquenil sent to the pharmacy today.  She was advised to notify us if she develops signs or symptoms of a flare.  She will follow-up in the office in 5 months or sooner if needed.  - Plan: Urinalysis, Routine w reflex microscopic, CBC with Differential/Platelet, COMPLETE METABOLIC PANEL WITH GFR, Anti-DNA antibody, double-stranded, C3 and C4, Sedimentation rate  High risk medication use - Plaquenil 200 mg 1 tablet by mouth daily Monday through Friday. PLQ eye exam normal 06/26/2022 Dr. Dione Booze f/u 1 year  CBC and CMP WNL on 07/06/22.  Orders for CBC and CMP released today.  - Plan: CBC with Differential/Platelet, COMPLETE METABOLIC PANEL WITH GFR  Myofascial pain: She has intermittent myalgias and muscle tenderness due to myofascial pain.  She has been taking Advil as needed for pain relief.  Other fatigue: Chronic, stable.  Trochanteric bursitis of both hips: She experiences intermittent discomfort in both hips secondary to trochanter bursitis.  Encouraged patient to perform stretching exercises daily.  Age-related osteoporosis without current pathological fracture - DEXA updated on 03/08/22:  T score of -3.0, BMD 0.823 in the AP spine.  Patient was previously taking Fosamax 70 mg 1 tablet by mouth once weekly but had gaps in therapy due to increased symptoms of reflux  She received her first IV Reclast infusion on 07/31/22. Vitamin D was 57 on 07/06/2022.  Patient was encouraged to take  vitamin D supplement over-the-counter.  Vitamin D deficiency: Vitamin D was 57 on 07/06/2022.  Patient was encouraged to take a maintenance dose of vitamin D 2000 units daily.  DDD (degenerative disc disease), lumbar - History of scoliosis per patient. She had a CT of the lumbar spine on 11/02/2020 which revealed mild disc degeneration and bulging at L5-S1.  Other medical conditions are listed as follows:  History of TIA (transient ischemic attack)  History of gastroesophageal reflux (GERD)  Anxiety and depression  History of IBS  History of asthma  History of kidney stones  Hx of migraines  Vertigo  Orders: Orders Placed This Encounter  Procedures   Urinalysis, Routine w reflex microscopic   CBC with Differential/Platelet   COMPLETE METABOLIC PANEL WITH GFR   Anti-DNA antibody, double-stranded   C3 and C4   Sedimentation rate   Meds ordered this encounter  Medications   hydroxychloroquine (PLAQUENIL) 200 MG tablet    Sig: Take 200mg  by mouth Monday through Friday only. None on Saturday or Sunday.    Dispense:  60 tablet    Refill:  0     Follow-Up Instructions: Return in about 3 months (around 01/17/2023) for Systemic lupus erythematosus.   Gearldine Bienenstock, PA-C  Note - This record has been created using Dragon software.  Chart creation errors have been sought, but may not always  have been located. Such creation errors do not reflect on  the standard of medical care.

## 2022-10-17 ENCOUNTER — Ambulatory Visit: Payer: Medicaid Other | Attending: Physician Assistant | Admitting: Physician Assistant

## 2022-10-17 ENCOUNTER — Encounter: Payer: Self-pay | Admitting: Physician Assistant

## 2022-10-17 VITALS — BP 123/86 | HR 63 | Resp 17 | Ht 63.0 in | Wt 166.4 lb

## 2022-10-17 DIAGNOSIS — M810 Age-related osteoporosis without current pathological fracture: Secondary | ICD-10-CM

## 2022-10-17 DIAGNOSIS — M3219 Other organ or system involvement in systemic lupus erythematosus: Secondary | ICD-10-CM | POA: Diagnosis not present

## 2022-10-17 DIAGNOSIS — Z8669 Personal history of other diseases of the nervous system and sense organs: Secondary | ICD-10-CM

## 2022-10-17 DIAGNOSIS — R5383 Other fatigue: Secondary | ICD-10-CM

## 2022-10-17 DIAGNOSIS — R42 Dizziness and giddiness: Secondary | ICD-10-CM

## 2022-10-17 DIAGNOSIS — M5136 Other intervertebral disc degeneration, lumbar region: Secondary | ICD-10-CM

## 2022-10-17 DIAGNOSIS — Z87442 Personal history of urinary calculi: Secondary | ICD-10-CM

## 2022-10-17 DIAGNOSIS — Z8719 Personal history of other diseases of the digestive system: Secondary | ICD-10-CM

## 2022-10-17 DIAGNOSIS — Z8709 Personal history of other diseases of the respiratory system: Secondary | ICD-10-CM

## 2022-10-17 DIAGNOSIS — Z8673 Personal history of transient ischemic attack (TIA), and cerebral infarction without residual deficits: Secondary | ICD-10-CM | POA: Diagnosis not present

## 2022-10-17 DIAGNOSIS — M7062 Trochanteric bursitis, left hip: Secondary | ICD-10-CM

## 2022-10-17 DIAGNOSIS — F32A Depression, unspecified: Secondary | ICD-10-CM

## 2022-10-17 DIAGNOSIS — E559 Vitamin D deficiency, unspecified: Secondary | ICD-10-CM | POA: Diagnosis not present

## 2022-10-17 DIAGNOSIS — M7918 Myalgia, other site: Secondary | ICD-10-CM

## 2022-10-17 DIAGNOSIS — Z79899 Other long term (current) drug therapy: Secondary | ICD-10-CM

## 2022-10-17 DIAGNOSIS — M7061 Trochanteric bursitis, right hip: Secondary | ICD-10-CM

## 2022-10-17 DIAGNOSIS — F419 Anxiety disorder, unspecified: Secondary | ICD-10-CM | POA: Diagnosis not present

## 2022-10-17 MED ORDER — HYDROXYCHLOROQUINE SULFATE 200 MG PO TABS: ORAL_TABLET | ORAL | 0 refills | Status: AC

## 2022-10-17 NOTE — Progress Notes (Signed)
CBC WNL

## 2022-10-18 LAB — CBC WITH DIFFERENTIAL/PLATELET
Absolute Monocytes: 431 cells/uL (ref 200–950)
Basophils Absolute: 41 cells/uL (ref 0–200)
Basophils Relative: 0.7 %
Eosinophils Absolute: 260 cells/uL (ref 15–500)
Eosinophils Relative: 4.4 %
HCT: 44.3 % (ref 35.0–45.0)
Hemoglobin: 14.6 g/dL (ref 11.7–15.5)
Lymphs Abs: 1676 cells/uL (ref 850–3900)
MCH: 31.5 pg (ref 27.0–33.0)
MCHC: 33 g/dL (ref 32.0–36.0)
MCV: 95.5 fL (ref 80.0–100.0)
MPV: 10.1 fL (ref 7.5–12.5)
Monocytes Relative: 7.3 %
Neutro Abs: 3493 cells/uL (ref 1500–7800)
Neutrophils Relative %: 59.2 %
Platelets: 271 10*3/uL (ref 140–400)
RBC: 4.64 10*6/uL (ref 3.80–5.10)
RDW: 11.9 % (ref 11.0–15.0)
Total Lymphocyte: 28.4 %
WBC: 5.9 10*3/uL (ref 3.8–10.8)

## 2022-10-18 LAB — URINALYSIS, ROUTINE W REFLEX MICROSCOPIC
Bilirubin Urine: NEGATIVE
Glucose, UA: NEGATIVE
Hgb urine dipstick: NEGATIVE
Ketones, ur: NEGATIVE
Leukocytes,Ua: NEGATIVE
Nitrite: NEGATIVE
Protein, ur: NEGATIVE
Specific Gravity, Urine: 1.019 (ref 1.001–1.035)
pH: 6 (ref 5.0–8.0)

## 2022-10-18 LAB — ANTI-DNA ANTIBODY, DOUBLE-STRANDED: ds DNA Ab: <1 IU/mL

## 2022-10-18 LAB — COMPLETE METABOLIC PANEL WITH GFR
AG Ratio: 1.7 (calc) (ref 1.0–2.5)
ALT: 12 U/L (ref 6–29)
AST: 15 U/L (ref 10–35)
Albumin: 4.4 g/dL (ref 3.6–5.1)
Alkaline phosphatase (APISO): 87 U/L (ref 37–153)
BUN: 13 mg/dL (ref 7–25)
CO2: 23 mmol/L (ref 20–32)
Calcium: 8.8 mg/dL (ref 8.6–10.4)
Chloride: 105 mmol/L (ref 98–110)
Creat: 0.64 mg/dL (ref 0.50–1.03)
Globulin: 2.6 g/dL (calc) (ref 1.9–3.7)
Glucose, Bld: 76 mg/dL (ref 65–99)
Potassium: 4.1 mmol/L (ref 3.5–5.3)
Sodium: 140 mmol/L (ref 135–146)
Total Bilirubin: 0.4 mg/dL (ref 0.2–1.2)
Total Protein: 7 g/dL (ref 6.1–8.1)
eGFR: 106 mL/min/{1.73_m2} (ref >=60)

## 2022-10-18 LAB — C3 AND C4
C3 Complement: 157 mg/dL (ref 83–193)
C4 Complement: 42 mg/dL (ref 15–57)

## 2022-10-18 LAB — SEDIMENTATION RATE: Sed Rate: 11 mm/h (ref 0–30)

## 2022-10-18 NOTE — Progress Notes (Signed)
CMP WNL UA normal ESR

## 2022-10-18 NOTE — Progress Notes (Signed)
Complements WNL

## 2022-10-18 NOTE — Progress Notes (Signed)
ESR WNL

## 2022-10-19 NOTE — Progress Notes (Signed)
dsDNA negative.

## 2023-01-08 NOTE — Progress Notes (Signed)
Office Visit Note  Patient: Danielle Holden             Date of Birth: [birth year]           MRN: [id number]             PCP: Tollie Eth, NP Referring: Tollie Eth, NP Visit Date: 01/21/2023 Occupation: @GUAROCC @  Subjective:  Rash on left upper arm   History of Present Illness: Danielle Holden is a 52 y.o. female with history of systemic lupus and myofascial pain.  She is taking Plaquenil 200 mg 1 tablet by mouth daily Monday through Friday.  She is tolerating Plaquenil without any side effects and has not missed any doses recently.  Patient states she is not experiencing recurrent rashes since her last office visit.  She states the rash initially started at her neck and went down to her left breast and her abdomen and is now on her left upper arm.  Patient states that the rash is itchy and feels like it is a burning sensation at times.  She has been avoiding direct sun exposure.  She has tried using cortisone cream over-the-counter with no relief.  She has not seen a dermatologist in many years.  Patient states that at times she continues to have intermittent malar rashes as well.  She has occasional sores in her mouth as well as ongoing sicca symptoms.  Patient states that she continues to have pain involving multiple joints especially her knees and hips.  She is also having increased discomfort at the base of both heels.  She has tried over-the-counter orthotics with no relief.    Activities of Daily Living:  Patient reports morning stiffness for several hours.   Patient Reports nocturnal pain.  Difficulty dressing/grooming: Reports Difficulty climbing stairs: Reports Difficulty getting out of chair: Reports Difficulty using hands for taps, buttons, cutlery, and/or writing: Reports  Review of Systems  Constitutional:  Positive for fatigue and fever.  HENT:  Positive for mouth sores and mouth dryness.   Eyes:  Positive for dryness.  Respiratory:  Positive for shortness  of breath.   Cardiovascular:  Positive for palpitations. Negative for chest pain.  Gastrointestinal:  Positive for diarrhea. Negative for blood in stool and constipation.  Endocrine: Negative for increased urination.  Genitourinary:  Positive for involuntary urination.  Musculoskeletal:  Positive for joint pain, gait problem, joint pain, joint swelling, myalgias, muscle weakness, morning stiffness, muscle tenderness and myalgias.  Skin:  Positive for color change, rash and hair loss. Negative for sensitivity to sunlight.  Allergic/Immunologic: Positive for susceptible to infections.  Neurological:  Positive for headaches. Negative for dizziness.  Hematological:  Positive for swollen glands.  Psychiatric/Behavioral:  Positive for sleep disturbance. Negative for depressed mood. The patient is nervous/anxious.     PMFS History:  Patient Active Problem List   Diagnosis Date Noted   Patellofemoral pain syndrome of right knee 12/12/2021   DDD (degenerative disc disease), cervical 09/11/2021   Scoliosis 09/11/2021   Other systemic lupus erythematosus with other organ involvement (HCC) 09/01/2021   Asthma due to seasonal allergies 09/01/2021   Insomnia 09/01/2021   Myofascial pain 05/29/2018   History of IBS 05/29/2018   History of gastroesophageal reflux (GERD) 05/29/2018   History of TIA (transient ischemic attack) 05/29/2018   History of rheumatoid arthritis 10/25/2016   Seasonal allergies 10/25/2016   Ventral hernia 10/25/2016   Vertigo 10/25/2016   Fibromyalgia 03/05/2016   Other specified abnormal immunological findings in  serum 03/05/2016   Raynaud's disease without gangrene 03/05/2016   Generalized anxiety disorder 07/26/2014   Hx of migraines 01/27/2014   Iron deficiency anemia 06/30/2013   Pain in joints 06/04/2013   Herpes zoster 02/22/2012    Past Medical History:  Diagnosis Date   Abnormal uterine bleeding    Abnormal uterine bleeding (AUB) 07/13/2013   Anxiety     Arthritis    hands, knees, shoulder, back    Asthma    Asthma in adult, mild persistent, uncomplicated 05/29/2018   Chest pain 06/27/2013   Colitis    History of   Complication of anesthesia 01/1992   During tubal ligation heart stopped and she stopped breathing   Depression    Environmental and seasonal allergies    Fatigue    Gallstones    GERD (gastroesophageal reflux disease)    Heart rate slow    per watch   History of kidney stones    History of kidney stones 05/29/2018   History of shingles    Hypoglycemia    IBS (irritable bowel syndrome)    Iron deficiency anemia    receive iron infusion   Lactose intolerance    Lumbar herniated disc    per patient   Lupus (HCC)    Migraine    Migraine without aura and without status migrainosus, not intractable 07/26/2014   Migraine, unspecified, without mention of intractable migraine without mention of status migrainosus 09/28/2013   Numbness and tingling of both upper extremities    first thing in the morning when she wakes up   Numbness and tingling of lower extremity    first thing in the morning when she wakes up   Osteoporosis    per patient, DEXA in 07/2019   RA (rheumatoid arthritis) (HCC)    Ventral hernia    Vertigo     Family History  Problem Relation Age of Onset   Suicidality Father    Diabetes Maternal Grandmother    Breast cancer Maternal Grandmother    Cervical cancer Paternal Grandmother    Healthy Daughter    Heart defect Son    Healthy Son    Past Surgical History:  Procedure Laterality Date   APPENDECTOMY     CESAREAN SECTION     CHOLECYSTECTOMY [name] 08/07/2017   Procedure: LAPAROSCOPIC CHOLECYSTECTOMY, PRIMARY REPAIR OF UMBILICAL HERNIA;  Surgeon: Andria Meuse, MD;  Location: [facility];  Service: General;  Laterality: [name];   COLONOSCOPY     IUD REMOVAL [name] 11/12/2014   TUBAL LIGATION     UMBILICAL HERNIA REPAIR     Social History   Social History Narrative   Patient lives at home alone and  she is disabled.   Education 9th grade.   Right handed.   Caffeine one cup of coffee not daily.    Immunization History  Administered Date(s) Administered   Influenza,inj,Quad PF,6+ Mos 03/06/2018, 02/11/2019   Influenza-Unspecified 03/06/2018, 02/11/2019, 01/13/2020, 01/13/2020, 02/22/2021   PFIZER(Purple Top)SARS-COV-2 Vaccination 08/01/2019, 08/22/2019, 03/04/2020   Tetanus 01/13/2020     Objective: Vital Signs: BP 111/77 (BP Location: Right Arm, Patient Position: Sitting, Cuff Size: Large)   Pulse 66   Resp 14   Ht 5\' 3"  (1.6 m)   Wt 168 lb 3.2 oz (76.3 kg)   LMP 07/20/2018 (Exact Date)   BMI 29.80 kg/m    Physical Exam Vitals and nursing note reviewed.  Constitutional:      Appearance: She is well-developed.  HENT:     Head:  Normocephalic and atraumatic.  Eyes:     Conjunctiva/sclera: Conjunctivae normal.  Cardiovascular:     Rate and Rhythm: Normal rate and regular rhythm.     Heart sounds: Normal heart sounds.  Pulmonary:     Effort: Pulmonary effort is normal.     Breath sounds: Normal breath sounds.  Abdominal:     General: Bowel sounds are normal.     Palpations: Abdomen is soft.  Musculoskeletal:     Cervical back: Normal range of motion.  Lymphadenopathy:     Cervical: No cervical adenopathy.  Skin:    General: Skin is warm and dry.     Capillary Refill: Capillary refill takes less than 2 seconds.     Comments: Erythematous faint maculopapular rash on left upper arm   Neurological:     Mental Status: She is alert and oriented to person, place, and time.  Psychiatric:        Behavior: Behavior normal.       Musculoskeletal Exam: C-spine, thoracic spine, lumbar spine have good range of motion.  Shoulder joints, elbow joints, wrist joints, MCPs, PIPs, DIPs have good range of motion with no synovitis.  Complete fist formation bilaterally. Discomfort with ROM of the right hip.  Knee joints have good ROM with no warmth or effusion.  Ankle joints have good  ROM with no tenderness or joint swelling.   CDAI Exam: CDAI Score: -- Patient Global: --; Provider Global: -- Swollen: --; Tender: -- Joint Exam 01/21/2023   No joint exam has been documented for this visit   There is currently no information documented on the homunculus. Go to the Rheumatology activity and complete the homunculus joint exam.  Investigation: No additional findings.  Imaging: No results found.  Recent Labs: Lab Results  Component Value Date   WBC 5.9 10/17/2022   HGB 14.6 10/17/2022   PLT 271 10/17/2022   NA 140 10/17/2022   K 4.1 10/17/2022   CL 105 10/17/2022   CO2 23 10/17/2022   GLUCOSE 76 10/17/2022   BUN 13 10/17/2022   CREATININE 0.64 10/17/2022   BILITOT 0.4 10/17/2022   ALKPHOS 125 (H) 07/26/2021   AST 15 10/17/2022   ALT 12 10/17/2022   PROT 7.0 10/17/2022   ALBUMIN 5.0 (H) 07/26/2021   CALCIUM 8.8 10/17/2022   GFRAA 114 04/11/2020    Speciality Comments: PLQ eye exam normal 06/26/2022 Dr. Dione Booze f/u 1 year  Fosamax12/21  Procedures:  No procedures performed Allergies: Anesthetics, amide; Benzocaine; Black pepper [piper]; Mushroom extract complex; Trazodone and nefazodone; Amoxicillin; Bee venom; Benadryl [diphenhydramine]; Effexor [venlafaxine]; Lactose intolerance (gi); Nortriptyline; Nsaids; and Other        Assessment / Plan:     Visit Diagnoses: Other systemic lupus erythematosus with other organ involvement (HCC) - +ANA, +dsDNA,history of oral ulcers, arthralgias, joint swelling, photosensitivity, rash: Patient remains on Plaquenil 200 mg 1 tablet by mouth twice daily Monday to Friday.  She continues to tolerate Plaquenil without any side effects.  Patient reports that since her last office visit she is been experiencing recurrent rashes that have been migratory.  She presents today with an erythematous rash on her left upper arm.  She has been avoiding direct sun exposure as advised.  She has been unable to identify a trigger for  these rashes.  A referral to dermatology was placed today.  A prescription for triamcinolone 0.1% cream will also be sent to the pharmacy.  Patient was advised to avoid direct sun exposure and wear sunscreen  SPF greater than 50 on a daily basis. She continues to have arthralgias and fatigue.  No synovitis was noted on examination today.  Lab work from 10/17/22 were reviewed today in the office: ESR WNL, complements WNL, dsDNA negative, UA normal, and CBC and CMP WNL.   The following lab work will be updated today to assess for a flare.  She will remain on plaquenil as prescribed.  She was advised to notify us if she develops signs or symptoms of a flare.  She will follow up in 5 months or sooner if needed.   - Plan: COMPLETE METABOLIC PANEL WITH GFR, CBC with Differential/Platelet, Protein / creatinine ratio, urine, Anti-DNA antibody, double-stranded, C3 and C4, Sedimentation rate, VITAMIN D 25 Hydroxy (Vit-D Deficiency, Fractures), Ambulatory referral to Dermatology  Rash and other nonspecific skin eruption -Patient has been experiencing migratory recurrent rashes since her last office visit.  These rashes typically itch and cause burning of the skin.  The rashes have been self resolving.  She has tried over-the-counter cortisone cream with no improvement.  She remains on Plaquenil as prescribed and has been avoiding direct sun exposure.  She has not been evaluated by dermatology.  She presented today with erythematous mildly raised rash on her left upper arm.  A prescription for triamcinolone 0.1% cream was sent to the pharmacy to apply topically as needed.  Referral to dermatology also placed for further evaluation.  She will remain on Plaquenil as prescribed.  Plan: Ambulatory referral to Dermatology  High risk medication use - Plaquenil 200 mg 1 tablet by mouth daily Monday through Friday. PLQ eye exam normal 06/26/2022 Dr. Dione Booze f/u 1 year  CBC and CMP WNL on 10/17/22. CBC and CMP released today.   -  Plan: COMPLETE METABOLIC PANEL WITH GFR, CBC with Differential/Platelet  Myofascial pain: Patient continues to experience intermittent allergies and muscle tenderness due to myofascial pain.  No muscular weakness was noted on examination today.  Other fatigue: Chronic, stable.  Lab work was obtained today for further evaluation.  Trochanteric bursitis of both hips: She continues to have discomfort in both hips especially the right hip.  Discussed the importance of performing stretching exercises daily.  Age-related osteoporosis without current pathological fracture -DEXA updated on 03/08/22:  T score of -3.0, BMD 0.823 in the AP spine.  Patient was previously taking Fosamax 70 mg 1 tablet by mouth once weekly but had gaps in therapy due to increased symptoms of reflux  She received her first IV Reclast infusion on 07/31/22.  Tolerated IV reclast with no side effects.   Vitamin D was 57 on 07/06/2022.  Plan: VITAMIN D 25 Hydroxy (Vit-D Deficiency, Fractures)  Vitamin D deficiency -Vitamin D will be rechecked today.  Plan: VITAMIN D 25 Hydroxy (Vit-D Deficiency, Fractures)  DDD (degenerative disc disease), lumbar - History of scoliosis per patient. She had a CT of the lumbar spine on 11/02/2020 which revealed mild disc degeneration and bulging at L5-S1.  Chronic pain.  Plantar fasciitis, bilateral -Patient presents today with increased pain in both heels.  No recent injury or fall.  She has been trying over-the-counter orthotics with no improvement in her symptoms.  Her symptoms are consistent with plantar fasciitis bilaterally.  Referral to [facility] was placed today as requested.  She was also given a handout of exercises to perform.  Plan: Ambulatory referral to Podiatry  Other medical conditions are listed as follows:   History of TIA (transient ischemic attack)  Anxiety and depression  History of gastroesophageal reflux (GERD)  History of IBS  History of asthma  History of  kidney stones  Hx of migraines    Orders: Orders Placed This Encounter  Procedures   COMPLETE METABOLIC PANEL WITH GFR   CBC with Differential/Platelet   Protein / creatinine ratio, urine   Anti-DNA antibody, double-stranded   C3 and C4   Sedimentation rate   VITAMIN D 25 Hydroxy (Vit-D Deficiency, Fractures)   Ambulatory referral to Podiatry   Ambulatory referral to Dermatology   Meds ordered this encounter  Medications   triamcinolone cream (KENALOG) 0.1 %    Sig: Apply 1 Application topically 2 (two) times daily.    Dispense:  30 g    Refill:  0    Follow-Up Instructions: Return in 5 months (on 06/23/2023) for Systemic lupus erythematosus.   Gearldine Bienenstock, PA-C  Note - This record has been created using Dragon software.  Chart creation errors have been sought, but may not always  have been located. Such creation errors do not reflect on  the standard of medical care.

## 2023-01-21 ENCOUNTER — Ambulatory Visit: Payer: Medicaid Other | Attending: Physician Assistant | Admitting: Physician Assistant

## 2023-01-21 ENCOUNTER — Encounter: Payer: Self-pay | Admitting: Physician Assistant

## 2023-01-21 VITALS — BP 111/77 | HR 66 | Resp 14 | Ht 63.0 in | Wt 168.2 lb

## 2023-01-21 DIAGNOSIS — E559 Vitamin D deficiency, unspecified: Secondary | ICD-10-CM

## 2023-01-21 DIAGNOSIS — M3219 Other organ or system involvement in systemic lupus erythematosus: Secondary | ICD-10-CM | POA: Diagnosis not present

## 2023-01-21 DIAGNOSIS — Z8673 Personal history of transient ischemic attack (TIA), and cerebral infarction without residual deficits: Secondary | ICD-10-CM

## 2023-01-21 DIAGNOSIS — Z87442 Personal history of urinary calculi: Secondary | ICD-10-CM

## 2023-01-21 DIAGNOSIS — M722 Plantar fascial fibromatosis: Secondary | ICD-10-CM

## 2023-01-21 DIAGNOSIS — M810 Age-related osteoporosis without current pathological fracture: Secondary | ICD-10-CM | POA: Diagnosis not present

## 2023-01-21 DIAGNOSIS — M7061 Trochanteric bursitis, right hip: Secondary | ICD-10-CM | POA: Diagnosis not present

## 2023-01-21 DIAGNOSIS — R21 Rash and other nonspecific skin eruption: Secondary | ICD-10-CM

## 2023-01-21 DIAGNOSIS — Z79899 Other long term (current) drug therapy: Secondary | ICD-10-CM | POA: Diagnosis not present

## 2023-01-21 DIAGNOSIS — R5383 Other fatigue: Secondary | ICD-10-CM

## 2023-01-21 DIAGNOSIS — M7918 Myalgia, other site: Secondary | ICD-10-CM

## 2023-01-21 DIAGNOSIS — M5136 Other intervertebral disc degeneration, lumbar region: Secondary | ICD-10-CM | POA: Diagnosis not present

## 2023-01-21 DIAGNOSIS — Z8719 Personal history of other diseases of the digestive system: Secondary | ICD-10-CM | POA: Diagnosis not present

## 2023-01-21 DIAGNOSIS — Z8669 Personal history of other diseases of the nervous system and sense organs: Secondary | ICD-10-CM

## 2023-01-21 DIAGNOSIS — F419 Anxiety disorder, unspecified: Secondary | ICD-10-CM

## 2023-01-21 DIAGNOSIS — F32A Depression, unspecified: Secondary | ICD-10-CM

## 2023-01-21 DIAGNOSIS — Z8709 Personal history of other diseases of the respiratory system: Secondary | ICD-10-CM

## 2023-01-21 DIAGNOSIS — M7062 Trochanteric bursitis, left hip: Secondary | ICD-10-CM

## 2023-01-21 MED ORDER — TRIAMCINOLONE ACETONIDE 0.1 % EX CREA: 1.0000 | TOPICAL_CREAM | Freq: Two times a day (BID) | CUTANEOUS | 0 refills | Status: DC

## 2023-01-21 NOTE — Patient Instructions (Signed)
Plantar Fasciitis Rehab Ask your health care provider which exercises are safe for you. Do exercises exactly as told by your health care provider and adjust them as directed. It is normal to feel mild stretching, pulling, tightness, or discomfort as you do these exercises. Stop right away if you feel sudden pain or your pain gets worse. Do not begin these exercises until told by your health care provider. Stretching and range-of-motion exercises These exercises warm up your muscles and joints and improve the movement and flexibility of your foot. These exercises also help to relieve pain. Plantar fascia stretch  Sit with your left / right leg crossed over your opposite knee. Hold your heel with one hand with that thumb near your arch. With your other hand, hold your toes and gently pull them back toward the top of your foot. You should feel a stretch on the base (bottom) of your toes, or the bottom of your foot (plantar fascia), or both. Hold this stretch for__________ seconds. Slowly release your toes and return to the starting position. Repeat __________ times. Complete this exercise __________ times a day. Gastrocnemius stretch, standing This exercise is also called a calf (gastroc) stretch. It stretches the muscles in the back of the upper calf. Stand with your hands against a wall. Extend your left / right leg behind you, and bend your front knee slightly. Keeping your heels on the floor, your toes facing forward, and your back knee straight, shift your weight toward the wall. Do not arch your back. You should feel a gentle stretch in your upper calf. Hold this position for __________ seconds. Repeat __________ times. Complete this exercise __________ times a day. Soleus stretch, standing This exercise is also called a calf (soleus) stretch. It stretches the muscles in the back of the lower calf. Stand with your hands against a wall. Extend your left / right leg behind you, and bend your  front knee slightly. Keeping your heels on the floor and your toes facing forward, bend your back knee and shift your weight slightly over your back leg. You should feel a gentle stretch deep in your lower calf. Hold this position for __________ seconds. Repeat __________ times. Complete this exercise __________ times a day. Gastroc and soleus stretch, standing step This exercise stretches the muscles in the back of the lower leg. These muscles are in the upper calf (gastrocnemius) and the lower calf (soleus). Stand with the ball of your left / right foot on the front of a step. The ball of your foot is on the walking surface, right under your toes. Keep your other foot firmly on the same step. Hold on to the wall or a railing for balance. Slowly lift your other foot, allowing your body weight to press your heel down over the edge of the front of the step. Keep knee straight and unbent. You should feel a stretch in your calf. Hold this position for __________ seconds. Return both feet to the step. Repeat this exercise with a slight bend in your left / right knee. Repeat __________ times with your left / right knee straight and __________ times with your left / right knee bent. Complete this exercise __________ times a day. Balance exercise This exercise builds your balance and strength control of your arch to help take pressure off your plantar fascia. Single leg stand If this exercise is too easy, you can try it with your eyes closed or while standing on a pillow. Without shoes, stand near a   railing or in a doorway. You may hold on to the railing or door frame as needed. Stand on your left / right foot. Keep your big toe down on the floor and lift the arch of your foot. You should feel a stretch across the bottom of your foot and your arch. Do not let your foot roll inward. Hold this position for __________ seconds. Repeat __________ times. Complete this exercise __________ times a day. This  information is not intended to replace advice given to you by your health care provider. Make sure you discuss any questions you have with your health care provider. Document Revised: 02/11/2020 Document Reviewed: 02/11/2020 Elsevier Patient Education  2024 [employer].

## 2023-01-22 LAB — PROTEIN / CREATININE RATIO, URINE
Creatinine, Urine: 27 mg/dL (ref 20–275)
Total Protein, Urine: <4 mg/dL — ABNORMAL LOW (ref 5–24)

## 2023-01-22 LAB — COMPLETE METABOLIC PANEL WITH GFR
AG Ratio: 1.7 (calc) (ref 1.0–2.5)
ALT: 8 U/L (ref 6–29)
AST: 14 U/L (ref 10–35)
Albumin: 4.3 g/dL (ref 3.6–5.1)
Alkaline phosphatase (APISO): 87 U/L (ref 37–153)
BUN: 10 mg/dL (ref 7–25)
CO2: 24 mmol/L (ref 20–32)
Calcium: 8.8 mg/dL (ref 8.6–10.4)
Chloride: 107 mmol/L (ref 98–110)
Creat: 0.68 mg/dL (ref 0.50–1.03)
Globulin: 2.6 g/dL (ref 1.9–3.7)
Glucose, Bld: 80 mg/dL (ref 65–99)
Potassium: 3.8 mmol/L (ref 3.5–5.3)
Sodium: 141 mmol/L (ref 135–146)
Total Bilirubin: 0.4 mg/dL (ref 0.2–1.2)
Total Protein: 6.9 g/dL (ref 6.1–8.1)
eGFR: 104 mL/min/{1.73_m2} (ref >=60)

## 2023-01-22 LAB — CBC WITH DIFFERENTIAL/PLATELET
Absolute Monocytes: 482 {cells}/uL (ref 200–950)
Basophils Absolute: 40 {cells}/uL (ref 0–200)
Basophils Relative: 0.6 %
Eosinophils Absolute: 235 {cells}/uL (ref 15–500)
Eosinophils Relative: 3.5 %
HCT: 42.1 % (ref 35.0–45.0)
Hemoglobin: 13.9 g/dL (ref 11.7–15.5)
Lymphs Abs: 1963 {cells}/uL (ref 850–3900)
MCH: 31.7 pg (ref 27.0–33.0)
MCHC: 33 g/dL (ref 32.0–36.0)
MCV: 95.9 fL (ref 80.0–100.0)
MPV: 10.4 fL (ref 7.5–12.5)
Monocytes Relative: 7.2 %
Neutro Abs: 3980 {cells}/uL (ref 1500–7800)
Neutrophils Relative %: 59.4 %
Platelets: 222 10*3/uL (ref 140–400)
RBC: 4.39 10*6/uL (ref 3.80–5.10)
RDW: 12 % (ref 11.0–15.0)
Total Lymphocyte: 29.3 %
WBC: 6.7 10*3/uL (ref 3.8–10.8)

## 2023-01-22 LAB — C3 AND C4
C3 Complement: 153 mg/dL (ref 83–193)
C4 Complement: 34 mg/dL (ref 15–57)

## 2023-01-22 LAB — ANTI-DNA ANTIBODY, DOUBLE-STRANDED: ds DNA Ab: <1 [IU]/mL

## 2023-01-22 LAB — SEDIMENTATION RATE: Sed Rate: 11 mm/h (ref 0–30)

## 2023-01-22 LAB — VITAMIN D 25 HYDROXY (VIT D DEFICIENCY, FRACTURES): Vit D, 25-Hydroxy: 37 ng/mL (ref 30–100)

## 2023-01-22 NOTE — Progress Notes (Signed)
No proteinuria.   CBC and CMP WNL  Vitamin D WNL  ESR WNL

## 2023-01-22 NOTE — Progress Notes (Signed)
Complements WNL

## 2023-01-23 ENCOUNTER — Encounter: Payer: Self-pay | Admitting: Nurse Practitioner

## 2023-01-23 NOTE — Progress Notes (Signed)
dsDNA is negative

## 2023-02-22 ENCOUNTER — Encounter: Payer: Self-pay | Admitting: Podiatry

## 2023-02-22 ENCOUNTER — Ambulatory Visit: Payer: Medicaid Other | Admitting: Podiatry

## 2023-02-22 ENCOUNTER — Ambulatory Visit (INDEPENDENT_AMBULATORY_CARE_PROVIDER_SITE_OTHER): Payer: Medicaid Other

## 2023-02-22 DIAGNOSIS — M722 Plantar fascial fibromatosis: Secondary | ICD-10-CM | POA: Diagnosis not present

## 2023-02-22 MED ORDER — MELOXICAM 15 MG PO TABS: 15.0000 mg | ORAL_TABLET | Freq: Every day | ORAL | 0 refills | Status: AC

## 2023-02-22 NOTE — Progress Notes (Signed)
  Subjective:  Patient ID: Danielle Holden, female    DOB: [birth year],  MRN: [id number]  Chief Complaint  Patient presents with   Foot Pain    PATIENT STATES THAT SHE HAS PAIN IN THE HEEL AND ARCH IN BOTH FEET AND DORSAL OF BILATERAL . PAIN IS VERY SHARP AND HAS BURNING WITH INFLAMMATION AND SWELLING.      52 y.o. female presents with the above complaint.  Patient presents for pain of bilateral heel on the bottom of her heels.  Patient states she has sharp pain stabbing pains especially when she gets out of bed in the morning.   Review of Systems: Negative except as noted in the HPI. Denies N/V/F/Ch.   Objective:  There were no vitals filed for this visit. There is no height or weight on file to calculate BMI. Constitutional Well developed. Well nourished.  Vascular Dorsalis pedis pulses palpable bilaterally. Posterior tibial pulses palpable bilaterally. Capillary refill normal to all digits.  No cyanosis or clubbing noted. Pedal hair growth normal.  Neurologic Normal speech. Oriented to person, place, and time. Epicritic sensation to light touch grossly present bilaterally.  Dermatologic Nails well groomed and normal in appearance. No open wounds. No skin lesions.  Orthopedic: Normal joint ROM without pain or crepitus bilaterally. No visible deformities. Tender to palpation at the calcaneal tuber bilaterally. No pain with calcaneal squeeze bilaterally. Ankle ROM diminished range of motion bilaterally. Silfverskiold Test: positive bilaterally.   Radiographs: Taken and reviewed. No acute fractures or dislocations. No evidence of stress fracture.  Plantar heel spur absent. Posterior heel spur absent.   Assessment:   1. Plantar fasciitis, bilateral    Plan:  Patient was evaluated and treated and all questions answered.  Plantar Fasciitis, bilaterally - XR reviewed as above.  - Educated on icing and stretching. Instructions given.  - Injection delivered to the plantar  fascia as below. - DME: PowerStep orthotics dispensed - Pharmacologic management: Meloxicam. Educated on risks/benefits and proper taking of medication.  Procedure: Injection Tendon/Ligament Location: Bilateral plantar fascia at the glabrous junction; medial approach. Skin Prep: alcohol Injectate: 1 cc 0.5% marcaine plain, 1 cc kenalog 10. Disposition: Patient tolerated procedure well. Injection site dressed with a band-aid.  Return in about 4 weeks (around 03/22/2023).

## 2023-02-22 NOTE — Patient Instructions (Signed)

## 2023-03-18 ENCOUNTER — Other Ambulatory Visit (HOSPITAL_BASED_OUTPATIENT_CLINIC_OR_DEPARTMENT_OTHER): Payer: Self-pay | Admitting: Nurse Practitioner

## 2023-03-18 DIAGNOSIS — Z1231 Encounter for screening mammogram for malignant neoplasm of breast: Secondary | ICD-10-CM

## 2023-03-22 ENCOUNTER — Encounter: Payer: Self-pay | Admitting: Podiatry

## 2023-03-22 ENCOUNTER — Ambulatory Visit: Payer: Medicaid Other | Admitting: Podiatry

## 2023-03-22 DIAGNOSIS — M722 Plantar fascial fibromatosis: Secondary | ICD-10-CM | POA: Diagnosis not present

## 2023-03-22 NOTE — Progress Notes (Signed)
  Subjective:  Patient ID: Danielle Holden, female    DOB: 04-Mar-1971,  MRN: [id number]  Chief Complaint  Patient presents with   Routine Post Op    PATIENT STATES SHE GETS PAIN ONCE IN A WHILE BUT IT IS NOT LIKE HOW IT USE TO BE , SHE DOESN'T THINK SHE NEEDS ANY NEEDLES , SHE HAS SOME WALKING SHOES AND RUNNING SHOES HER FEET HAVE BEEN GREAT . NO COMPLAINS     52 y.o. female presents with for follow-up plantar fasciitis.  She states she is doing much better she gets pain once a while but is much improved has gotten some new shoes that have been working great no complaints.   Review of Systems: Negative except as noted in the HPI. Denies N/V/F/Ch.   Objective:  There were no vitals filed for this visit. There is no height or weight on file to calculate BMI. Constitutional Well developed. Well nourished.  Vascular Dorsalis pedis pulses palpable bilaterally. Posterior tibial pulses palpable bilaterally. Capillary refill normal to all digits.  No cyanosis or clubbing noted. Pedal hair growth normal.  Neurologic Normal speech. Oriented to person, place, and time. Epicritic sensation to light touch grossly present bilaterally.  Dermatologic Nails well groomed and normal in appearance. No open wounds. No skin lesions.  Orthopedic: Normal joint ROM without pain or crepitus bilaterally. No visible deformities. Nont ender to palpation at the calcaneal tuber bilaterally. No pain with calcaneal squeeze bilaterally. Ankle ROM diminished range of motion bilaterally. Silfverskiold Test: positive bilaterally.   Radiographs: Taken and reviewed. No acute fractures or dislocations. No evidence of stress fracture.  Plantar heel spur absent. Posterior heel spur absent.   Assessment:   1. Plantar fasciitis, bilateral     Plan:  Patient was evaluated and treated and all questions answered.  Plantar Fasciitis, bilaterally -Much improved from prior no pain at this time continue -Continue  stretching icing and anti-inflammatories as needed - DME: Continue good supportive shoes with power step orthotics - Pharmacologic management: Ibuprofen as needed for pain - Follow up as needed for injections

## 2023-03-28 ENCOUNTER — Ambulatory Visit: Payer: Medicaid Other | Admitting: Obstetrics & Gynecology

## 2023-04-01 ENCOUNTER — Ambulatory Visit (HOSPITAL_BASED_OUTPATIENT_CLINIC_OR_DEPARTMENT_OTHER): Payer: Medicaid Other | Admitting: Family Medicine

## 2023-04-01 ENCOUNTER — Ambulatory Visit (HOSPITAL_BASED_OUTPATIENT_CLINIC_OR_DEPARTMENT_OTHER)
Admission: RE | Admit: 2023-04-01 | Discharge: 2023-04-01 | Disposition: A | Discharge status: Home / Self Care | Payer: Medicaid Other | Source: Ambulatory Visit | Attending: Family Medicine | Admitting: Family Medicine

## 2023-04-01 ENCOUNTER — Encounter (HOSPITAL_BASED_OUTPATIENT_CLINIC_OR_DEPARTMENT_OTHER): Payer: Self-pay | Admitting: Family Medicine

## 2023-04-01 VITALS — BP 128/77 | HR 82 | Ht 64.0 in | Wt 166.9 lb

## 2023-04-01 DIAGNOSIS — M7989 Other specified soft tissue disorders: Secondary | ICD-10-CM | POA: Insufficient documentation

## 2023-04-01 DIAGNOSIS — M79602 Pain in left arm: Secondary | ICD-10-CM | POA: Diagnosis not present

## 2023-04-01 DIAGNOSIS — R600 Localized edema: Secondary | ICD-10-CM | POA: Diagnosis not present

## 2023-04-01 NOTE — Patient Instructions (Signed)
Go to 1st floor imaging at 3:30pm for your ultrasound

## 2023-04-01 NOTE — Progress Notes (Signed)
Hi Danielle Holden,  Your ultrasound of your left arm does not show a blood clot. You may have a strain to the shoulder causing your radiating pain.  I would recommend using tylenol for pain, using a heat or ice pack and simple range of motion stretches. If you would like to get an xray and/or follow up with orthopedics please let me know I would be happy to order it.

## 2023-04-01 NOTE — Progress Notes (Unsigned)
Acute Care Office Visit  Subjective:   Danielle Holden 12-19-1970 04/01/2023  Chief Complaint  Patient presents with   Arm Pain    Former [facility] Dec Early pt; pt has been having pain in her left arm for about 5 days now. Has had some swelling in her arm and has not been able to lift her arm that high either.    HPI:  Pain to left upper arm:  Onset of pain approx. 5 days ago. Denies falls, traumas, heavy lifting to LUE. Patient reports there was "bubble" noticed today to her left forearm area. She states she has noticed generalized swelling and limited ROM to her left arm for the past 5 days. She states significant burning, and sharp pain present.Reports intermittent numbness with   Hx of Raynaud's, SLE and signifiant arthritis managed by Rheumatology.    The following portions of the patient's history were reviewed and updated as appropriate: past medical history, past surgical history, family history, social history, allergies, medications, and problem list.   Patient Active Problem List   Diagnosis Date Noted   Patellofemoral pain syndrome of right knee 12/12/2021   DDD (degenerative disc disease), cervical 09/11/2021   Scoliosis 09/11/2021   Other systemic lupus erythematosus with other organ involvement (HCC) 09/01/2021   Asthma due to seasonal allergies 09/01/2021   Insomnia 09/01/2021   Myofascial pain 05/29/2018   History of IBS 05/29/2018   History of gastroesophageal reflux (GERD) 05/29/2018   History of TIA (transient ischemic attack) 05/29/2018   History of rheumatoid arthritis 10/25/2016   Seasonal allergies 10/25/2016   Ventral hernia 10/25/2016   Vertigo 10/25/2016   Fibromyalgia 03/05/2016   Other specified abnormal immunological findings in serum 03/05/2016   Raynaud's disease without gangrene 03/05/2016   Generalized anxiety disorder 07/26/2014   Hx of migraines 01/27/2014   Iron deficiency anemia 06/30/2013   Pain in joints 06/04/2013   Herpes  zoster 02/22/2012   Past Medical History:  Diagnosis Date   Abnormal uterine bleeding    Abnormal uterine bleeding (AUB) 07/13/2013   Anxiety    Arthritis    hands, knees, shoulder, back    Asthma    Asthma in adult, mild persistent, uncomplicated 05/29/2018   Chest pain 06/27/2013   Colitis    History of   Complication of anesthesia 01/1992   During tubal ligation heart stopped and she stopped breathing   Depression    Environmental and seasonal allergies    Fatigue    Gallstones    GERD (gastroesophageal reflux disease)    Heart rate slow    per watch   History of kidney stones    History of kidney stones 05/29/2018   History of shingles    Hypoglycemia    IBS (irritable bowel syndrome)    Iron deficiency anemia    receive iron infusion   Lactose intolerance    Lumbar herniated disc    per patient   Lupus    Migraine    Migraine without aura and without status migrainosus, not intractable 07/26/2014   Migraine, unspecified, without mention of intractable migraine without mention of status migrainosus 09/28/2013   Numbness and tingling of both upper extremities    first thing in the morning when she wakes up   Numbness and tingling of lower extremity    first thing in the morning when she wakes up   Osteoporosis    per patient, DEXA in 07/2019   RA (rheumatoid arthritis) (HCC)  Ventral hernia    Vertigo    Past Surgical History:  Procedure Laterality Date   APPENDECTOMY     CESAREAN SECTION     CHOLECYSTECTOMY [name] 08/07/2017   Procedure: LAPAROSCOPIC CHOLECYSTECTOMY, PRIMARY REPAIR OF UMBILICAL HERNIA;  Surgeon: Andria Meuse, MD;  Location: [facility];  Service: General;  Laterality: [name];   COLONOSCOPY     IUD REMOVAL [name] 11/12/2014   TUBAL LIGATION     UMBILICAL HERNIA REPAIR     Family History  Problem Relation Age of Onset   Suicidality Father    Diabetes Maternal Grandmother    Breast cancer Maternal Grandmother    Cervical cancer Paternal Grandmother     Healthy Daughter    Heart defect Son    Healthy Son    Outpatient Medications Prior to Visit  Medication Sig Dispense Refill   acetaminophen (TYLENOL) 325 MG tablet Take 650 mg by mouth every 6 (six) hours as needed.     albuterol (VENTOLIN HFA) 108 (90 Base) MCG/ACT inhaler Inhale 1-2 puffs into the lungs every 4 (four) hours as needed for wheezing. 2 each 11   alendronate (FOSAMAX) 70 MG tablet TAKE 1 TABLET BY MOUTH ONCE A WEEK. TAKE WITH A FULL GLASS OF WATER ON AN EMPTY STOMACH. 12 tablet 0   EPINEPHRINE 0.3 mg/0.3 mL IJ SOAJ injection INJECT 0.3 MLS (0.3 MG TOTAL) INTO THE MUSCLE AS NEEDED FOR ANAPHYLAXIS. 2 each 2   Erenumab-aooe (AIMOVIG) 140 MG/ML SOAJ Inject 140 mg into the skin every 28 (twenty-eight) days. 1.12 mL 5   hydroxychloroquine (PLAQUENIL) 200 MG tablet Take 200mg  by mouth Monday through Friday only. None on Saturday or Sunday. 60 tablet 0   ibuprofen (ADVIL) 800 MG tablet Take 1 tablet (800 mg total) by mouth every 8 (eight) hours as needed. Take after a meal. 30 tablet 5   meclizine (ANTIVERT) 25 MG tablet Take 1 tablet (25 mg total) by mouth 3 (three) times daily as needed for dizziness (VERTIGO). 30 tablet 11   Melatonin 5 MG CAPS Take by mouth at bedtime.     meloxicam (MOBIC) 15 MG tablet Take 1 tablet (15 mg total) by mouth daily. 30 tablet 0   mometasone (NASONEX) 50 MCG/ACT nasal spray One spray in each nostril twice a day, use left hand for right nostril, and right hand for left nostril.  Please dispense one bottle. 1 g 6   ondansetron (ZOFRAN ODT) 4 MG disintegrating tablet Take 1 tablet (4 mg total) by mouth every 8 (eight) hours as needed for nausea or vomiting. 20 tablet 5   Rimegepant Sulfate (NURTEC) 75 MG TBDP Take 75 mg by mouth daily as needed. 8 tablet 5   triamcinolone cream (KENALOG) 0.1 % Apply 1 Application topically 2 (two) times daily. 30 g 0   levocetirizine (XYZAL) 5 MG tablet TAKE 1 TABLET BY MOUTH EVERY DAY IN THE EVENING (Patient not taking:  Reported on 04/01/2023) 30 tablet 0   cefUROXime (CEFTIN) 500 MG tablet Take 1 tablet (500 mg total) by mouth 2 (two) times daily with a meal. 14 tablet 0   nitrofurantoin, macrocrystal-monohydrate, (MACROBID) 100 MG capsule Take 1 capsule (100 mg total) by mouth 2 (two) times daily. 14 capsule 1   Prenat w/o A-FE-Methfol-FA-DHA (PNV-DHA) 27-0.6-0.4-300 MG CAPS TAKE 1 CAPSULE BY MOUTH EVERY DAY BEFORE BREAKFAST 90 capsule 4   Vitamin D, Ergocalciferol, (DRISDOL) 1.25 MG (50000 UNIT) CAPS capsule TAKE 1 CAPSULE BY MOUTH EVERY 7 DAYS FOR 6 WEEKS. 6  capsule 0   Vitamin D, Ergocalciferol, (DRISDOL) 1.25 MG (50000 UNIT) CAPS capsule Take 1 capsule (50,000 Units total) by mouth every 7 (seven) days. 5 capsule 0   No facility-administered medications prior to visit.   Allergies  Allergen Reactions   Anesthetics, Amide Anaphylaxis and Other (See Comments)    Heart stopped and stopped breathing   Benzocaine Anaphylaxis   Black Pepper [Piper] Anaphylaxis    Throat swelling   Mushroom Extract Complex Anaphylaxis    Mouth swelling   Trazodone And Nefazodone Anaphylaxis    Tongue swells.   Amoxicillin Hives and Diarrhea    Has patient had a PCN reaction causing immediate rash, facial/tongue/throat swelling, SOB or lightheadedness with hypotension: No Has patient had a PCN reaction causing severe rash involving mucus membranes or skin necrosis: Yes Has patient had a PCN reaction that required hospitalization: Yes Has patient had a PCN reaction occurring within the last 10 years: Yes If all of the above answers are "NO", then may proceed with Cephalosporin use.    Bee Venom Swelling   Benadryl [Diphenhydramine]     Heavy periods    Effexor [Venlafaxine] Hives   Lactose Intolerance (Gi)     Stomach pain   Nortriptyline Hives   Nsaids     Make colitis worse   Other Diarrhea    Red meat - stomach pains, bleeding      ROS: A complete ROS was performed with pertinent positives/negatives noted in  the HPI. The remainder of the ROS are negative.    Objective:   Today's Vitals   04/01/23 1404  BP: 128/77  Pulse: 82  SpO2: 98%  Weight: 166 lb 14.4 oz (75.7 kg)  Height: 5\' 4"  (1.626 m)    GENERAL: Well-appearing, in NAD. Well nourished.  SKIN: Pink, warm and dry. No rash, lesion, ulceration, or ecchymoses.  Head: Normocephalic. NECK: Trachea midline. Full ROM w/o pain or tenderness.  RESPIRATORY: Chest wall symmetrical. Respirations even and non-labored. Breath sounds clear to auscultation bilaterally.  CARDIAC: S1, S2 present, regular rate and rhythm without murmur or gallops. Peripheral pulses 2+ bilaterally.  MSK: Muscle tone and strength appropriate for age. + mild edema present to left upper extremity, +3 radial pulses bilaterally. Cap refill is less than 3 seconds bilaterally. +mildly positive Roos test. Decreased ROM to LUE with Flexion, extension, abduction and external rotation with pain radiating to left scapula and left shoulder. Negative Phalen's, Finklesteins'. Mild pain with valgus stress of left wrist.  EXTREMITIES: Without clubbing, cyanosis, or edema.  NEUROLOGIC: No motor or sensory deficits. Steady, even gait. C2-C12 intact.  PSYCH/MENTAL STATUS: Alert, oriented x 3. Cooperative, appropriate mood and affect.    No results found for any visits on 04/01/23.    Assessment & Plan:  1. Left upper extremity swelling Concern for possible LUE DVT, Thoracic outlet syndrome,     - US Venous Img Upper Uni Left (DVT); Future - D-Dimer, Quantitative   No orders of the defined types were placed in this encounter.  Lab Orders         D-Dimer, Quantitative     No images are attached to the encounter or orders placed in the encounter.  Return in about 2 weeks (around 04/15/2023) for Follow up Arm Pain .    Patient to reach out to office if new, worrisome, or unresolved symptoms arise or if no improvement in patient's condition. Patient verbalized understanding and  is agreeable to treatment plan. All questions answered to patient's satisfaction.  Yolanda Manges, FNP

## 2023-04-02 ENCOUNTER — Telehealth (HOSPITAL_BASED_OUTPATIENT_CLINIC_OR_DEPARTMENT_OTHER): Payer: Self-pay | Admitting: Family Medicine

## 2023-04-02 LAB — D-DIMER, QUANTITATIVE: D-DIMER: 0.36 mg{FEU}/L (ref 0.00–0.49)

## 2023-04-02 NOTE — Progress Notes (Signed)
D Dimer is unremarkable.

## 2023-04-02 NOTE — Telephone Encounter (Signed)
Noted  

## 2023-04-02 NOTE — Telephone Encounter (Signed)
Pt called in regards to xray she would like to get and was advised to go to walk-in ortho clinic on 2nd floor if she would like to be seen today.

## 2023-04-13 ENCOUNTER — Emergency Department (HOSPITAL_COMMUNITY): Payer: Medicaid Other

## 2023-04-13 ENCOUNTER — Emergency Department (HOSPITAL_COMMUNITY)
Admission: EM | Admit: 2023-04-13 | Discharge: 2023-04-13 | Disposition: A | Discharge status: Home / Self Care | Payer: Medicaid Other | Attending: Emergency Medicine | Admitting: Emergency Medicine

## 2023-04-13 DIAGNOSIS — S0083XA Contusion of other part of head, initial encounter: Secondary | ICD-10-CM | POA: Insufficient documentation

## 2023-04-13 DIAGNOSIS — S60416A Abrasion of right little finger, initial encounter: Secondary | ICD-10-CM | POA: Diagnosis not present

## 2023-04-13 DIAGNOSIS — Y92481 Parking lot as the place of occurrence of the external cause: Secondary | ICD-10-CM | POA: Insufficient documentation

## 2023-04-13 DIAGNOSIS — Z043 Encounter for examination and observation following other accident: Secondary | ICD-10-CM | POA: Diagnosis not present

## 2023-04-13 DIAGNOSIS — R0781 Pleurodynia: Secondary | ICD-10-CM | POA: Diagnosis not present

## 2023-04-13 DIAGNOSIS — W19XXXA Unspecified fall, initial encounter: Secondary | ICD-10-CM

## 2023-04-13 DIAGNOSIS — W01198A Fall on same level from slipping, tripping and stumbling with subsequent striking against other object, initial encounter: Secondary | ICD-10-CM | POA: Diagnosis not present

## 2023-04-13 DIAGNOSIS — S0990XA Unspecified injury of head, initial encounter: Secondary | ICD-10-CM | POA: Diagnosis present

## 2023-04-13 DIAGNOSIS — M25511 Pain in right shoulder: Secondary | ICD-10-CM | POA: Diagnosis not present

## 2023-04-13 MED ORDER — ACETAMINOPHEN 325 MG PO TABS
650.0000 mg | ORAL_TABLET | Freq: Once | ORAL | Status: AC
  Administered 2023-04-13: 650 mg via ORAL
  Filled 2023-04-13: qty 2

## 2023-04-13 NOTE — ED Provider Notes (Signed)
Received patient in turnover from Dr. Wallace Cullens.  Please see their note for further details of Hx, PE.  Briefly patient is a 52 y.o. female with a Fall .  She is awaiting imaging studies.  On my record review the patient's last tetanus was updated in 2021.  Plain films independently interpreted by me without obvious fracture.  CT of the head and C-spine are also negative for acute intracranial or acute intraspinal pathology.  I discussed results with the patient.  Will discharge home.  PCP follow-up.    Melene Plan, DO 04/13/23 1805

## 2023-04-13 NOTE — ED Triage Notes (Signed)
Patient tripped and fell in parking lot. Hit right side of her head and right side of chest area. Complaining of rib pain, headache. Denies LOC, no N/V. Did have some dizziness after but denies at present.

## 2023-04-13 NOTE — ED Provider Notes (Signed)
EMERGENCY DEPARTMENT AT [facility] Provider Note   CSN: [id number] Arrival date & time: 04/13/23  1358     History  Chief Complaint  Patient presents with   Danielle Holden is a 52 y.o. female.  Pt is a 52 year old female presenting for fall with blunt head trauma. Pt states she tripped and fell this morning while walking into a store. Admits to head trauma-broke her glasses and has abrasion to the face. Admits to falling down on her right side with right hand/wrist pain. Denies LOC. Denies blood thinner use.   The history is provided by the patient. No language interpreter was used.  Fall Pertinent negatives include no chest pain, no abdominal pain and no shortness of breath.       Home Medications Prior to Admission medications   Medication Sig Start Date End Date Taking? Authorizing Provider  acetaminophen (TYLENOL) 325 MG tablet Take 650 mg by mouth every 6 (six) hours as needed.    [provider]  albuterol (VENTOLIN HFA) 108 (90 Base) MCG/ACT inhaler Inhale 1-2 puffs into the lungs every 4 (four) hours as needed for wheezing. 08/25/21   Tollie Eth, NP  alendronate (FOSAMAX) 70 MG tablet TAKE 1 TABLET BY MOUTH ONCE A WEEK. TAKE WITH A FULL GLASS OF WATER ON AN EMPTY STOMACH. 02/06/21   Gearldine Bienenstock, PA-C  EPINEPHRINE 0.3 mg/0.3 mL IJ SOAJ injection INJECT 0.3 MLS (0.3 MG TOTAL) INTO THE MUSCLE AS NEEDED FOR ANAPHYLAXIS. 08/11/21   Brock Bad, MD  Erenumab-aooe (AIMOVIG) 140 MG/ML SOAJ Inject 140 mg into the skin every 28 (twenty-eight) days. 09/18/21   Drema Dallas, DO  hydroxychloroquine (PLAQUENIL) 200 MG tablet Take 200mg  by mouth Monday through Friday only. None on Saturday or Sunday. 10/17/22   Gearldine Bienenstock, PA-C  ibuprofen (ADVIL) 800 MG tablet Take 1 tablet (800 mg total) by mouth every 8 (eight) hours as needed. Take after a meal. 07/29/19   Brock Bad, MD  levocetirizine (XYZAL) 5 MG tablet TAKE 1 TABLET BY  MOUTH EVERY DAY IN THE EVENING Patient not taking: Reported on 04/01/2023 07/23/22   Early, Sung Amabile, NP  meclizine (ANTIVERT) 25 MG tablet Take 1 tablet (25 mg total) by mouth 3 (three) times daily as needed for dizziness (VERTIGO). 08/25/21   Tollie Eth, NP  Melatonin 5 MG CAPS Take by mouth at bedtime.    [provider]  meloxicam (MOBIC) 15 MG tablet Take 1 tablet (15 mg total) by mouth daily. 02/22/23   Standiford, Jenelle Mages, DPM  mometasone (NASONEX) 50 MCG/ACT nasal spray One spray in each nostril twice a day, use left hand for right nostril, and right hand for left nostril.  Please dispense one bottle. 08/25/21   Early, Sung Amabile, NP  ondansetron (ZOFRAN ODT) 4 MG disintegrating tablet Take 1 tablet (4 mg total) by mouth every 8 (eight) hours as needed for nausea or vomiting. 09/14/20   Drema Dallas, DO  Rimegepant Sulfate (NURTEC) 75 MG TBDP Take 75 mg by mouth daily as needed. 09/18/21   Drema Dallas, DO  triamcinolone cream (KENALOG) 0.1 % Apply 1 Application topically 2 (two) times daily. 01/21/23   Gearldine Bienenstock, PA-C      Allergies    Anesthetics, amide; Benzocaine; Black pepper [piper]; Mushroom extract complex; Trazodone and nefazodone; Amoxicillin; Bee venom; Benadryl [diphenhydramine]; Effexor [venlafaxine]; Lactose intolerance (gi); Nortriptyline; Nsaids; and Other  Review of Systems   Review of Systems  Constitutional:  Negative for chills and fever.  HENT:  Negative for ear pain and sore throat.   Eyes:  Negative for pain and visual disturbance.  Respiratory:  Negative for cough and shortness of breath.   Cardiovascular:  Negative for chest pain and palpitations.  Gastrointestinal:  Negative for abdominal pain and vomiting.  Genitourinary:  Negative for dysuria and hematuria.  Musculoskeletal:  Negative for arthralgias and back pain.  Skin:  Negative for color change and rash.  Neurological:  Negative for seizures and syncope.  All other systems reviewed and are  negative.   Physical Exam Updated Vital Signs BP 129/84 (BP Location: Right Arm)   Pulse 80   Temp 97.9 F (36.6 C) (Oral)   Resp 18   LMP 07/20/2018 (Exact Date)   SpO2 100%  Physical Exam Vitals and nursing note reviewed.  Constitutional:      General: She is not in acute distress.    Appearance: She is well-developed.  HENT:     Head: Normocephalic.   Eyes:     General: Vision grossly intact.     Conjunctiva/sclera: Conjunctivae normal.     Pupils: Pupils are equal, round, and reactive to light.  Cardiovascular:     Rate and Rhythm: Normal rate and regular rhythm.     Heart sounds: No murmur heard. Pulmonary:     Effort: Pulmonary effort is normal. No respiratory distress.     Breath sounds: Normal breath sounds.  Abdominal:     Palpations: Abdomen is soft.     Tenderness: There is no abdominal tenderness.  Musculoskeletal:        General: No swelling.     Right shoulder: Bony tenderness present. No swelling or deformity. Normal range of motion.     Left shoulder: Normal.     Right upper arm: Normal.     Left upper arm: Normal.     Right elbow: Normal.     Left elbow: Normal.     Right forearm: Normal.     Left forearm: Normal.     Right wrist: Normal.     Left wrist: Normal.     Right hand: Bony tenderness present. No swelling or deformity. Lacerations: minimal abrasions to fifth lateral digit.    Left hand: Normal.     Cervical back: Neck supple. Bony tenderness present.     Thoracic back: No bony tenderness.     Lumbar back: No bony tenderness.  Skin:    General: Skin is warm and dry.     Capillary Refill: Capillary refill takes less than 2 seconds.       Neurological:     General: No focal deficit present.     Mental Status: She is alert and oriented to person, place, and time.     GCS: GCS eye subscore is 4. GCS verbal subscore is 5. GCS motor subscore is 6.     Cranial Nerves: Cranial nerves 2-12 are intact.     Sensory: Sensation is intact.      Motor: Motor function is intact.     Gait: Gait is intact.  Psychiatric:        Mood and Affect: Mood normal.     ED Results / Procedures / Treatments   Labs (all labs ordered are listed, but only abnormal results are displayed) Labs Reviewed - No data to display  EKG None  Radiology No results found.  Procedures Procedures  Medications Ordered in ED Medications  acetaminophen (TYLENOL) tablet 650 mg (650 mg Oral Given 04/13/23 1501)    ED Course/ Medical Decision Making/ A&P                                 Medical Decision Making Amount and/or Complexity of Data Reviewed Radiology: ordered.  Risk OTC drugs.   Pt is a 52 year old female presenting for fall with blunt head trauma.  Patient is alert and oriented x 3, no acute distress, afebrile, stable vital signs.  PERRLA.  Vision intact.  Normal extraocular eye motions.  Patient does have an abrasion to the right supraorbital ridge.  Tenderness to palpation.  Tenderness palpation of the maxillary bone on the right.  Tenderness palpation of the zygomatic arch on the right.  Patient also has bony tenderness to the right shoulder as well as the fifth digit on the right.  Digit has minimal abrasions.  Otherwise no swelling, ecchymosis or gross bony deformities noted.  Patient also has tenderness palpation of the right side of her ribs without any ecchymosis, swelling, or bony deformities.  CT head, neck, x-rays of the shoulder and hand are pending.  Tylenol given for headache.  Patient otherwise neurovascularly intact.  Signed out to oncoming physician while awaiting images.            Final Clinical Impression(s) / ED Diagnoses Final diagnoses:  Contusion of face, initial encounter  Fall, initial encounter    Rx / DC Orders ED Discharge Orders     None         Franne Forts, DO 04/13/23 1521

## 2023-04-13 NOTE — Discharge Instructions (Addendum)
Take 4 over the counter ibuprofen tablets 3 times a day or 2 over-the-counter naproxen tablets twice a day for pain. Also take tylenol 1000mg(2 extra strength) four times a day.    

## 2023-04-15 ENCOUNTER — Encounter (HOSPITAL_BASED_OUTPATIENT_CLINIC_OR_DEPARTMENT_OTHER): Payer: Self-pay | Admitting: Family Medicine

## 2023-04-15 ENCOUNTER — Ambulatory Visit (HOSPITAL_BASED_OUTPATIENT_CLINIC_OR_DEPARTMENT_OTHER): Payer: Medicaid Other | Admitting: Family Medicine

## 2023-04-15 VITALS — BP 108/75 | HR 69 | Temp 97.7°F | Ht 63.0 in | Wt 168.0 lb

## 2023-04-15 DIAGNOSIS — R300 Dysuria: Secondary | ICD-10-CM

## 2023-04-15 DIAGNOSIS — Z8739 Personal history of other diseases of the musculoskeletal system and connective tissue: Secondary | ICD-10-CM

## 2023-04-15 DIAGNOSIS — M3219 Other organ or system involvement in systemic lupus erythematosus: Secondary | ICD-10-CM | POA: Diagnosis not present

## 2023-04-15 DIAGNOSIS — M79602 Pain in left arm: Secondary | ICD-10-CM | POA: Diagnosis not present

## 2023-04-15 DIAGNOSIS — J45909 Unspecified asthma, uncomplicated: Secondary | ICD-10-CM

## 2023-04-15 LAB — POCT URINALYSIS DIP (CLINITEK)
Bilirubin, UA: NEGATIVE
Blood, UA: NEGATIVE
Glucose, UA: NEGATIVE mg/dL
Ketones, POC UA: NEGATIVE mg/dL
Leukocytes, UA: NEGATIVE
Nitrite, UA: NEGATIVE
POC PROTEIN,UA: NEGATIVE
Spec Grav, UA: >=1.03 — AB (ref 1.010–1.025)
Urobilinogen, UA: 0.2 U/dL
pH, UA: 6 (ref 5.0–8.0)

## 2023-04-15 MED ORDER — LEVOCETIRIZINE DIHYDROCHLORIDE 5 MG PO TABS: 5.0000 mg | ORAL_TABLET | Freq: Every evening | ORAL | 3 refills | Status: AC

## 2023-04-15 MED ORDER — NURTEC 75 MG PO TBDP: 75.0000 mg | ORAL_TABLET | Freq: Every day | ORAL | 5 refills | Status: AC | PRN

## 2023-04-15 MED ORDER — ONDANSETRON 4 MG PO TBDP: 4.0000 mg | ORAL_TABLET | Freq: Three times a day (TID) | ORAL | 5 refills | Status: AC | PRN

## 2023-04-15 NOTE — Patient Instructions (Addendum)
Please establish with OBGYN.   Please increase your clear fluids.   Schedule your bone density scan.

## 2023-04-15 NOTE — Progress Notes (Signed)
Subjective:   Danielle Holden March 28, 1971 04/15/2023  Chief Complaint  Patient presents with   Follow-up    Left arm still feels the same as if it was dislocated. Doing exercises but it still hurts from left breast all the way to left shoulder blade. BS feels like it has been low. Get shaky and face gets pale. Was advised to get kidneys checked due to previous labs. BP during sleep ranging 40-45. Trouble sleeping. Xyzal, nasonex, zofran, and nurtec.    HPI: Danielle Holden presents today for re-assessment and management of chronic medical conditions.  LEFT ARM PAIN:  Patient is here for follow-up of left upper arm pain.  She had noticed generalized swelling and limited range of motion early November.  She did have a vascular study to rule out DVT and this was unremarkable.  She was recommended to rest, or perform range of motion exercises, use ice and heat.  She does have a history of fibromyalgia and lupus.  She is managed by rheumatology currently.  She states she still having significant decreased range of motion and pain present into the left shoulder and scapula area.  She is reporting intermittent numbness to fingertips of her left hand.   Dysuria:  Patient states she has noticed an increase in urination with drinking increased amounts of water.  Reports mild dysuria that is intermittent.  Declines hematuria, fever, chills, urgency, flank pain.  The following portions of the patient's history were reviewed and updated as appropriate: past medical history, past surgical history, family history, social history, allergies, medications, and problem list.   Patient Active Problem List   Diagnosis Date Noted   Patellofemoral pain syndrome of right knee 12/12/2021   DDD (degenerative disc disease), cervical 09/11/2021   Scoliosis 09/11/2021   Other systemic lupus erythematosus with other organ involvement (HCC) 09/01/2021   Asthma due to seasonal allergies 09/01/2021    Insomnia 09/01/2021   Myofascial pain 05/29/2018   History of IBS 05/29/2018   History of gastroesophageal reflux (GERD) 05/29/2018   History of TIA (transient ischemic attack) 05/29/2018   History of rheumatoid arthritis 10/25/2016   Seasonal allergies 10/25/2016   Ventral hernia 10/25/2016   Vertigo 10/25/2016   Fibromyalgia 03/05/2016   Other specified abnormal immunological findings in serum 03/05/2016   Raynaud's disease without gangrene 03/05/2016   Generalized anxiety disorder 07/26/2014   Hx of migraines 01/27/2014   Iron deficiency anemia 06/30/2013   Pain in joints 06/04/2013   Herpes zoster 02/22/2012   Past Medical History:  Diagnosis Date   Abnormal uterine bleeding    Abnormal uterine bleeding (AUB) 07/13/2013   Anxiety    Arthritis    hands, knees, shoulder, back    Asthma    Asthma in adult, mild persistent, uncomplicated 05/29/2018   Chest pain 06/27/2013   Colitis    History of   Complication of anesthesia 01/1992   During tubal ligation heart stopped and she stopped breathing   Depression    Environmental and seasonal allergies    Fatigue    Gallstones    GERD (gastroesophageal reflux disease)    Heart rate slow    per watch   History of kidney stones    History of kidney stones 05/29/2018   History of shingles    Hypoglycemia    IBS (irritable bowel syndrome)    Iron deficiency anemia    receive iron infusion   Lactose intolerance    Lumbar herniated disc  per patient   Lupus    Migraine    Migraine without aura and without status migrainosus, not intractable 07/26/2014   Migraine, unspecified, without mention of intractable migraine without mention of status migrainosus 09/28/2013   Numbness and tingling of both upper extremities    first thing in the morning when she wakes up   Numbness and tingling of lower extremity    first thing in the morning when she wakes up   Osteoporosis    per patient, DEXA in 07/2019   RA (rheumatoid arthritis)  (HCC)    Ventral hernia    Vertigo    Past Surgical History:  Procedure Laterality Date   APPENDECTOMY     CESAREAN SECTION     CHOLECYSTECTOMY [name] 08/07/2017   Procedure: LAPAROSCOPIC CHOLECYSTECTOMY, PRIMARY REPAIR OF UMBILICAL HERNIA;  Surgeon: Andria Meuse, MD;  Location: [facility];  Service: General;  Laterality: [name];   COLONOSCOPY     IUD REMOVAL [name] 11/12/2014   TUBAL LIGATION     UMBILICAL HERNIA REPAIR     Family History  Problem Relation Age of Onset   Suicidality Father    Diabetes Maternal Grandmother    Breast cancer Maternal Grandmother    Cervical cancer Paternal Grandmother    Healthy Daughter    Heart defect Son    Healthy Son    Outpatient Medications Prior to Visit  Medication Sig Dispense Refill   acetaminophen (TYLENOL) 325 MG tablet Take 650 mg by mouth every 6 (six) hours as needed.     albuterol (VENTOLIN HFA) 108 (90 Base) MCG/ACT inhaler Inhale 1-2 puffs into the lungs every 4 (four) hours as needed for wheezing. 2 each 11   alendronate (FOSAMAX) 70 MG tablet TAKE 1 TABLET BY MOUTH ONCE A WEEK. TAKE WITH A FULL GLASS OF WATER ON AN EMPTY STOMACH. 12 tablet 0   EPINEPHRINE 0.3 mg/0.3 mL IJ SOAJ injection INJECT 0.3 MLS (0.3 MG TOTAL) INTO THE MUSCLE AS NEEDED FOR ANAPHYLAXIS. 2 each 2   hydroxychloroquine (PLAQUENIL) 200 MG tablet Take 200mg  by mouth Monday through Friday only. None on Saturday or Sunday. 60 tablet 0   ibuprofen (ADVIL) 200 MG tablet Take 200 mg by mouth as needed. 2-3 tablets as needed     meclizine (ANTIVERT) 25 MG tablet Take 1 tablet (25 mg total) by mouth 3 (three) times daily as needed for dizziness (VERTIGO). 30 tablet 11   Melatonin 5 MG CAPS Take by mouth at bedtime.     meloxicam (MOBIC) 15 MG tablet Take 1 tablet (15 mg total) by mouth daily. 30 tablet 0   mometasone (NASONEX) 50 MCG/ACT nasal spray One spray in each nostril twice a day, use left hand for right nostril, and right hand for left nostril.  Please dispense one  bottle. (Patient not taking: Reported on 04/15/2023) 1 g 6   Erenumab-aooe (AIMOVIG) 140 MG/ML SOAJ Inject 140 mg into the skin every 28 (twenty-eight) days. (Patient not taking: Reported on 04/15/2023) 1.12 mL 5   ibuprofen (ADVIL) 800 MG tablet Take 1 tablet (800 mg total) by mouth every 8 (eight) hours as needed. Take after a meal. (Patient not taking: Reported on 04/15/2023) 30 tablet 5   levocetirizine (XYZAL) 5 MG tablet TAKE 1 TABLET BY MOUTH EVERY DAY IN THE EVENING (Patient not taking: Reported on 04/15/2023) 30 tablet 0   ondansetron (ZOFRAN ODT) 4 MG disintegrating tablet Take 1 tablet (4 mg total) by mouth every 8 (eight) hours as needed for  nausea or vomiting. (Patient not taking: Reported on 04/15/2023) 20 tablet 5   Rimegepant Sulfate (NURTEC) 75 MG TBDP Take 75 mg by mouth daily as needed. (Patient not taking: Reported on 04/15/2023) 8 tablet 5   triamcinolone cream (KENALOG) 0.1 % Apply 1 Application topically 2 (two) times daily. (Patient not taking: Reported on 04/15/2023) 30 g 0   No facility-administered medications prior to visit.   Allergies  Allergen Reactions   Anesthetics, Amide Anaphylaxis and Other (See Comments)    Heart stopped and stopped breathing   Benzocaine Anaphylaxis   Black Pepper [Piper] Anaphylaxis    Throat swelling   Mushroom Extract Complex Anaphylaxis    Mouth swelling   Trazodone And Nefazodone Anaphylaxis    Tongue swells.   Amoxicillin Hives and Diarrhea    Has patient had a PCN reaction causing immediate rash, facial/tongue/throat swelling, SOB or lightheadedness with hypotension: No Has patient had a PCN reaction causing severe rash involving mucus membranes or skin necrosis: Yes Has patient had a PCN reaction that required hospitalization: Yes Has patient had a PCN reaction occurring within the last 10 years: Yes If all of the above answers are "NO", then may proceed with Cephalosporin use.    Bee Venom Swelling   Benadryl [Diphenhydramine]      Heavy periods    Effexor [Venlafaxine] Hives   Lactose Intolerance (Gi)     Stomach pain   Nortriptyline Hives   Nsaids     Make colitis worse   Other Diarrhea    Red meat - stomach pains, bleeding      ROS: A complete ROS was performed with pertinent positives/negatives noted in the HPI. The remainder of the ROS are negative.    Objective:   Today's Vitals   04/15/23 1330  BP: 108/75  Pulse: 69  Temp: 97.7 F (36.5 C)  TempSrc: Oral  SpO2: 99%  Weight: 168 lb (76.2 kg)  Height: 5\' 3"  (1.6 m)  PainSc: 7   PainLoc: Arm    Physical Exam          GENERAL: Well-appearing, in NAD. Well nourished.  SKIN: Pink, warm and dry. No rash, lesion, ulceration, or ecchymoses.  Head: Normocephalic. NECK: Trachea midline. Full ROM w/o pain or tenderness.  RESPIRATORY: Chest wall symmetrical. Respirations even and non-labored.  MSK: Muscle tone and strength appropriate for age. Joints w/o tenderness, redness, or swelling.  EXTREMITIES: Without clubbing, cyanosis, or edema.  No edema to left upper extremity present on exam.  Decreased range of motion present with abduction and internal and external rotation of left upper extremity.  No clicking, popping, or obvious deformity or dislocation present. NEUROLOGIC: No motor or sensory deficits. Steady, even gait. C2-C12 intact.  PSYCH/MENTAL STATUS: Alert, oriented x 3. Cooperative, appropriate mood and affect.      Assessment & Plan:  1. Asthma due to seasonal allergies Controlled, Xyzal refilled. - levocetirizine (XYZAL) 5 MG tablet; Take 1 tablet (5 mg total) by mouth every evening.  Dispense: 90 tablet; Refill: 3  2. Left arm pain Concern for possible rotator cuff injury or strain.  Referral placed to orthopedic surgery for further evaluation.  Recommend patient continue ice, heat, over-the-counter pain relief measures. - Ambulatory referral to Orthopedic Surgery  3. Other systemic lupus erythematosus with other organ involvement  (HCC) 4. History of rheumatoid arthritis Bone density ordered per patient request due to history of lupus and rheumatoid arthritis.  Currently on Fosamax that is managed by rheumatology and patient is tolerating  well. - DG Bone Density; Future  5. Dysuria Urinalysis is negative in office today.  Patient instructed to increase clear fluid intake and if symptoms return or worsen to return to PCP. - POCT URINALYSIS DIP (CLINITEK)  Meds ordered this encounter  Medications   levocetirizine (XYZAL) 5 MG tablet    Sig: Take 1 tablet (5 mg total) by mouth every evening.    Dispense:  90 tablet    Refill:  3    Order Specific Question:   Supervising Provider    Answer:   DE [place], RAYMOND J [[id number]]   ondansetron (ZOFRAN ODT) 4 MG disintegrating tablet    Sig: Take 1 tablet (4 mg total) by mouth every 8 (eight) hours as needed for nausea or vomiting.    Dispense:  20 tablet    Refill:  5    Order Specific Question:   Supervising Provider    Answer:   DE [place], RAYMOND J [[id number]]   Rimegepant Sulfate (NURTEC) 75 MG TBDP    Sig: Take 1 tablet (75 mg total) by mouth daily as needed.    Dispense:  8 tablet    Refill:  5    Order Specific Question:   Supervising Provider    Answer:   DE [place], RAYMOND J [[id number]]   Lab Orders         POCT URINALYSIS DIP (CLINITEK)      Return in about 6 months (around 10/14/2023) for ANNUAL PHYSICAL.    Patient to reach out to office if new, worrisome, or unresolved symptoms arise or if no improvement in patient's condition. Patient verbalized understanding and is agreeable to treatment plan. All questions answered to patient's satisfaction.    Hilbert Bible, [place]

## 2023-04-16 ENCOUNTER — Encounter (HOSPITAL_BASED_OUTPATIENT_CLINIC_OR_DEPARTMENT_OTHER): Payer: Self-pay | Admitting: Family Medicine

## 2023-04-17 ENCOUNTER — Ambulatory Visit: Payer: Medicaid Other | Admitting: Obstetrics & Gynecology

## 2023-04-17 ENCOUNTER — Other Ambulatory Visit (HOSPITAL_COMMUNITY)
Admission: RE | Admit: 2023-04-17 | Discharge: 2023-04-17 | Disposition: A | Discharge status: Home / Self Care | Payer: Medicaid Other | Source: Ambulatory Visit | Attending: Obstetrics & Gynecology | Admitting: Obstetrics & Gynecology

## 2023-04-17 ENCOUNTER — Encounter (HOSPITAL_BASED_OUTPATIENT_CLINIC_OR_DEPARTMENT_OTHER): Payer: Self-pay | Admitting: *Deleted

## 2023-04-17 ENCOUNTER — Encounter: Payer: Self-pay | Admitting: Obstetrics & Gynecology

## 2023-04-17 VITALS — BP 117/81 | HR 61 | Ht 63.0 in | Wt 165.0 lb

## 2023-04-17 DIAGNOSIS — Z1339 Encounter for screening examination for other mental health and behavioral disorders: Secondary | ICD-10-CM | POA: Diagnosis not present

## 2023-04-17 DIAGNOSIS — Z01419 Encounter for gynecological examination (general) (routine) without abnormal findings: Secondary | ICD-10-CM

## 2023-04-17 DIAGNOSIS — R102 Pelvic and perineal pain: Secondary | ICD-10-CM | POA: Diagnosis not present

## 2023-04-17 NOTE — Progress Notes (Addendum)
52 y.o. GYN presents for AEX.  C/o left Breast pain 7/10 x 3 months, nipple pain in right breast. LQ pain 8/10 x 1+ years.

## 2023-04-17 NOTE — Progress Notes (Unsigned)
GYNECOLOGY CLINIC ANNUAL PREVENTATIVE CARE ENCOUNTER NOTE  Subjective:   Danielle Holden is a 52 y.o. [phone] female here for a routine annual gynecologic exam.  Current complaints: continues to have pelvic pain.  She was told it could be ovarian cysts. Several urinary complaints including pain, incontinence, urge and frequency  Gynecologic History Patient's last menstrual period was 07/20/2018 (exact date). Contraception: tubal ligation Last Pap: 07/2021. Results were: normal Last mammogram: 2023. Results were: normal  Obstetric History OB History  Gravida Para Term Preterm AB Living  4 3 3   1 3   SAB IAB Ectopic Multiple Live Births  1       3    # Outcome Date GA Lbr Len/2nd Weight Sex Type Anes PTL Lv  4 SAB 2009 [redacted]w[redacted]d         3 Term 1993 [redacted]w[redacted]d   M Vag-Spont   LIV  2 Term 08/1990 [redacted]w[redacted]d   F Vag-Spont   LIV  1 Term 1989    M CS-LTranv   LIV    Past Medical History:  Diagnosis Date   Abnormal uterine bleeding    Abnormal uterine bleeding (AUB) 07/13/2013   Anxiety    Arthritis    hands, knees, shoulder, back    Asthma    Asthma in adult, mild persistent, uncomplicated 05/29/2018   Chest pain 06/27/2013   Colitis    History of   Complication of anesthesia 01/1992   During tubal ligation heart stopped and she stopped breathing   Depression    Environmental and seasonal allergies    Fatigue    Gallstones    GERD (gastroesophageal reflux disease)    Heart rate slow    per watch   History of kidney stones    History of kidney stones 05/29/2018   History of shingles    Hypoglycemia    IBS (irritable bowel syndrome)    Iron deficiency anemia    receive iron infusion   Lactose intolerance    Lumbar herniated disc    per patient   Lupus    Migraine    Migraine without aura and without status migrainosus, not intractable 07/26/2014   Migraine, unspecified, without mention of intractable migraine without mention of status migrainosus 09/28/2013   Numbness and tingling of  both upper extremities    first thing in the morning when she wakes up   Numbness and tingling of lower extremity    first thing in the morning when she wakes up   Osteoporosis    per patient, DEXA in 07/2019   RA (rheumatoid arthritis) (HCC)    Ventral hernia    Vertigo     Past Surgical History:  Procedure Laterality Date   APPENDECTOMY     CESAREAN SECTION     CHOLECYSTECTOMY [name] 08/07/2017   Procedure: LAPAROSCOPIC CHOLECYSTECTOMY, PRIMARY REPAIR OF UMBILICAL HERNIA;  Surgeon: Andria Meuse, MD;  Location: [facility];  Service: General;  Laterality: [name];   COLONOSCOPY     IUD REMOVAL [name] 11/12/2014   TUBAL LIGATION     UMBILICAL HERNIA REPAIR      Current Outpatient Medications on File Prior to Visit  Medication Sig Dispense Refill   acetaminophen (TYLENOL) 325 MG tablet Take 650 mg by mouth every 6 (six) hours as needed.     albuterol (VENTOLIN HFA) 108 (90 Base) MCG/ACT inhaler Inhale 1-2 puffs into the lungs every 4 (four) hours as needed for wheezing. 2 each 11   alendronate (FOSAMAX) 70 MG  tablet TAKE 1 TABLET BY MOUTH ONCE A WEEK. TAKE WITH A FULL GLASS OF WATER ON AN EMPTY STOMACH. 12 tablet 0   EPINEPHRINE 0.3 mg/0.3 mL IJ SOAJ injection INJECT 0.3 MLS (0.3 MG TOTAL) INTO THE MUSCLE AS NEEDED FOR ANAPHYLAXIS. 2 each 2   hydroxychloroquine (PLAQUENIL) 200 MG tablet Take 200mg  by mouth Monday through Friday only. None on Saturday or Sunday. 60 tablet 0   ibuprofen (ADVIL) 200 MG tablet Take 200 mg by mouth as needed. 2-3 tablets as needed     levocetirizine (XYZAL) 5 MG tablet Take 1 tablet (5 mg total) by mouth every evening. 90 tablet 3   meclizine (ANTIVERT) 25 MG tablet Take 1 tablet (25 mg total) by mouth 3 (three) times daily as needed for dizziness (VERTIGO). 30 tablet 11   Melatonin 5 MG CAPS Take by mouth at bedtime.     meloxicam (MOBIC) 15 MG tablet Take 1 tablet (15 mg total) by mouth daily. 30 tablet 0   mometasone (NASONEX) 50 MCG/ACT nasal spray One spray  in each nostril twice a day, use left hand for right nostril, and right hand for left nostril.  Please dispense one bottle. (Patient not taking: Reported on 04/15/2023) 1 g 6   ondansetron (ZOFRAN ODT) 4 MG disintegrating tablet Take 1 tablet (4 mg total) by mouth every 8 (eight) hours as needed for nausea or vomiting. 20 tablet 5   Rimegepant Sulfate (NURTEC) 75 MG TBDP Take 1 tablet (75 mg total) by mouth daily as needed. 8 tablet 5   No current facility-administered medications on file prior to visit.    Allergies  Allergen Reactions   Anesthetics, Amide Anaphylaxis and Other (See Comments)    Heart stopped and stopped breathing   Benzocaine Anaphylaxis   Black Pepper [Piper] Anaphylaxis    Throat swelling   Mushroom Extract Complex Anaphylaxis    Mouth swelling   Trazodone And Nefazodone Anaphylaxis    Tongue swells.   Amoxicillin Hives and Diarrhea    Has patient had a PCN reaction causing immediate rash, facial/tongue/throat swelling, SOB or lightheadedness with hypotension: No Has patient had a PCN reaction causing severe rash involving mucus membranes or skin necrosis: Yes Has patient had a PCN reaction that required hospitalization: Yes Has patient had a PCN reaction occurring within the last 10 years: Yes If all of the above answers are "NO", then may proceed with Cephalosporin use.    Bee Venom Swelling   Benadryl [Diphenhydramine]     Heavy periods    Effexor [Venlafaxine] Hives   Lactose Intolerance (Gi)     Stomach pain   Nortriptyline Hives   Nsaids     Make colitis worse   Other Diarrhea    Red meat - stomach pains, bleeding     Social History   Socioeconomic History   Marital status: Single    Spouse name: Not on file   Number of children: 3   Years of education: 9th   Highest education level: Not on file  Occupational History   Occupation: disabled    Comment: disabled due to anxiety  Tobacco Use   Smoking status: Never    Passive exposure: Past    Smokeless tobacco: Never  Vaping Use   Vaping status: Never Used  Substance and Sexual Activity   Alcohol use: No    Alcohol/week: 0.0 standard drinks of alcohol   Drug use: No    Comment: Quit in 1991   Sexual activity: Not Currently  Partners: Male    Birth control/protection: None, Surgical    Comment: Last encounter 1 week ago  Other Topics Concern   Not on file  Social History Narrative   Patient lives at home alone and she is disabled.   Education 9th grade.   Right handed.   Caffeine one cup of coffee not daily.    Social Determinants of Health   Financial Resource Strain: Medium Risk (04/01/2023)   Overall Financial Resource Strain (CARDIA)    Difficulty of Paying Living Expenses: Somewhat hard  Food Insecurity: No Food Insecurity (04/01/2023)   Hunger Vital Sign    Worried About Running Out of Food in the Last Year: Never true    Ran Out of Food in the Last Year: Never true  Transportation Needs: No Transportation Needs (04/01/2023)   PRAPARE - Administrator, Civil Service (Medical): No    Lack of Transportation (Non-Medical): No  Physical Activity: Sufficiently Active (04/01/2023)   Exercise Vital Sign    Days of Exercise per Week: 4 days    Minutes of Exercise per Session: 50 min  Stress: No Stress Concern Present (04/01/2023)   [employer] of Occupational Health - Occupational Stress Questionnaire    Feeling of Stress : Only a little  Social Connections: Socially Isolated (04/01/2023)   Social Connection and Isolation Panel [NHANES]    Frequency of Communication with Friends and Family: More than three times a week    Frequency of Social Gatherings with Friends and Family: Twice a week    Attends Religious Services: Never    Database administrator or Organizations: No    Attends Banker Meetings: Never    Marital Status: Divorced  Catering manager Violence: Not At Risk (04/01/2023)   Humiliation, Afraid, Rape, and Kick  questionnaire    Fear of Current or Ex-Partner: No    Emotionally Abused: No    Physically Abused: No    Sexually Abused: No    Family History  Problem Relation Age of Onset   Suicidality Father    Diabetes Maternal Grandmother    Breast cancer Maternal Grandmother    Cervical cancer Paternal Grandmother    Healthy Daughter    Heart defect Son    Healthy Son     The following portions of the patient's history were reviewed and updated as appropriate: allergies, current medications, past family history, past medical history, past social history, past surgical history and problem list.  Review of Systems Constitutional: positive for weight gain since menopause Gastrointestinal: positive for constipation Genitourinary:positive for frequency, pelvic pain,dyspareunia, incontinence   Objective:  BP 117/81   Pulse 61   Ht 5\' 3"  (1.6 m)   Wt 165 lb (74.8 kg)   LMP 07/20/2018 (Exact Date)   BMI 29.23 kg/m  CONSTITUTIONAL: Well-developed, well-nourished female in no acute distress.  HENT:  Normocephalic, atraumatic, External right and left ear normal. Oropharynx is clear and moist EYES: Conjunctivae and EOM are normal. Pupils are equal, round, and reactive to light. No scleral icterus.  NECK: Normal range of motion, supple, no masses.  Normal thyroid.  SKIN: Skin is warm and dry. No rash noted. Not diaphoretic. No erythema. No pallor. NEUROLGIC: Alert and oriented to person, place, and time. Normal reflexes, muscle tone coordination. No cranial nerve deficit noted. PSYCHIATRIC: Normal mood and affect. Normal behavior. Normal judgment and thought content. CARDIOVASCULAR: Normal heart rate noted, regular rhythm RESPIRATORY: Clear to auscultation bilaterally. Effort and breath sounds normal, no  problems with respiration noted. BREASTS: Symmetric in size. No masses, skin changes, nipple drainage, or lymphadenopathy. ABDOMEN: Soft, normal bowel sounds, no distention noted.  No tenderness,  rebound or guarding.  PELVIC: Normal appearing external genitalia; normal appearing vaginal mucosa and cervix.  No abnormal discharge noted.  Pap smear obtained.  Normal uterine size, no other palpable masses, mild pelvic tenderness MUSCULOSKELETAL: Normal range of motion. No tenderness.  No cyanosis, clubbing, or edema.     Assessment:  Annual gynecologic examination with pap smear  Well woman exam with routine gynecological exam - Plan: Cytology - PAP( [place]), MM Digital Diagnostic Bilat  Pelvic pain in female - Plan: Ambulatory referral to Urogynecology  Plan:  Will follow up results of pap smear and manage accordingly. Mammogram scheduled Routine preventative health maintenance measures emphasized. Please refer to After Visit Summary for other counseling recommendations.  Orders Placed This Encounter  Procedures   MM Digital Diagnostic Bilat    Standing Status:   Future    Standing Expiration Date:   07/16/2023    Order Specific Question:   Reason for Exam (SYMPTOM  OR DIAGNOSIS REQUIRED)    Answer:   breast pain    Order Specific Question:   Is the patient pregnant?    Answer:   No    Order Specific Question:   Preferred imaging location?    Answer:   [facility]   Ambulatory referral to Urogynecology    Referral Priority:   Routine    Referral Type:   Consultation    Referral Reason:   Specialty Services Required    Requested Specialty:   Urology    Number of Visits Requested:   1     Scheryl Darter, MD Attending Obstetrician & Gynecologist Center for [facility], [facility]

## 2023-04-19 ENCOUNTER — Other Ambulatory Visit: Payer: Self-pay | Admitting: Family Medicine

## 2023-04-19 DIAGNOSIS — N644 Mastodynia: Secondary | ICD-10-CM

## 2023-04-19 LAB — CYTOLOGY - PAP
Comment: NEGATIVE
Diagnosis: NEGATIVE
High risk HPV: NEGATIVE

## 2023-04-26 ENCOUNTER — Encounter (HOSPITAL_BASED_OUTPATIENT_CLINIC_OR_DEPARTMENT_OTHER): Payer: Self-pay | Admitting: Student

## 2023-04-26 ENCOUNTER — Ambulatory Visit (HOSPITAL_BASED_OUTPATIENT_CLINIC_OR_DEPARTMENT_OTHER): Payer: Medicaid Other | Admitting: Student

## 2023-04-26 ENCOUNTER — Ambulatory Visit (HOSPITAL_BASED_OUTPATIENT_CLINIC_OR_DEPARTMENT_OTHER): Payer: Medicaid Other

## 2023-04-26 ENCOUNTER — Other Ambulatory Visit (HOSPITAL_BASED_OUTPATIENT_CLINIC_OR_DEPARTMENT_OTHER): Payer: Self-pay

## 2023-04-26 DIAGNOSIS — G8929 Other chronic pain: Secondary | ICD-10-CM

## 2023-04-26 DIAGNOSIS — M25512 Pain in left shoulder: Secondary | ICD-10-CM

## 2023-04-26 DIAGNOSIS — M5412 Radiculopathy, cervical region: Secondary | ICD-10-CM

## 2023-04-26 DIAGNOSIS — M542 Cervicalgia: Secondary | ICD-10-CM | POA: Diagnosis not present

## 2023-04-26 MED ORDER — METHYLPREDNISOLONE 4 MG PO TBPK
ORAL_TABLET | ORAL | 0 refills | Status: AC
  Filled 2023-04-26: qty 21, 6d supply, fill #0

## 2023-04-26 NOTE — Progress Notes (Signed)
Chief Complaint: Left arm pain     History of Present Illness:    Danielle Holden is a 52 y.o. female today for evaluation of pain in her left arm.  This has been ongoing for last 2 months without any known injury.  States that over this time it has been worsening.  Pain travels up toward the left side of the neck as well as down the anterior left forearm down into the pinky finger.  Denies any numbness or tingling.  Does have pain with range of motion of the neck and shoulder.  Rates pain level today is 6/10.  Has tried Advil and Tylenol without significant relief.  She is right-hand dominant.   Surgical History:   None  PMH/PSH/Family History/Social History/Meds/Allergies:    Past Medical History:  Diagnosis Date   Abnormal uterine bleeding    Abnormal uterine bleeding (AUB) 07/13/2013   Anxiety    Arthritis    hands, knees, shoulder, back    Asthma    Asthma in adult, mild persistent, uncomplicated 05/29/2018   Chest pain 06/27/2013   Colitis    History of   Complication of anesthesia 01/1992   During tubal ligation heart stopped and she stopped breathing   Depression    Environmental and seasonal allergies    Fatigue    Gallstones    GERD (gastroesophageal reflux disease)    Heart rate slow    per watch   History of kidney stones    History of kidney stones 05/29/2018   History of shingles    Hypoglycemia    IBS (irritable bowel syndrome)    Iron deficiency anemia    receive iron infusion   Lactose intolerance    Lumbar herniated disc    per patient   Lupus    Migraine    Migraine without aura and without status migrainosus, not intractable 07/26/2014   Migraine, unspecified, without mention of intractable migraine without mention of status migrainosus 09/28/2013   Numbness and tingling of both upper extremities    first thing in the morning when she wakes up   Numbness and tingling of lower extremity    first thing in the  morning when she wakes up   Osteoporosis    per patient, DEXA in 07/2019   RA (rheumatoid arthritis) (HCC)    Ventral hernia    Vertigo    Past Surgical History:  Procedure Laterality Date   APPENDECTOMY     CESAREAN SECTION     CHOLECYSTECTOMY [name] 08/07/2017   Procedure: LAPAROSCOPIC CHOLECYSTECTOMY, PRIMARY REPAIR OF UMBILICAL HERNIA;  Surgeon: Andria Meuse, MD;  Location: [facility];  Service: General;  Laterality: [name];   COLONOSCOPY     IUD REMOVAL [name] 11/12/2014   TUBAL LIGATION     UMBILICAL HERNIA REPAIR     Social History   Socioeconomic History   Marital status: Single    Spouse name: Not on file   Number of children: 3   Years of education: 9th   Highest education level: Not on file  Occupational History   Occupation: disabled    Comment: disabled due to anxiety  Tobacco Use   Smoking status: Never    Passive exposure: Past   Smokeless tobacco: Never  Vaping Use   Vaping status: Never Used  Substance  and Sexual Activity   Alcohol use: No    Alcohol/week: 0.0 standard drinks of alcohol   Drug use: No    Comment: Quit in 1991   Sexual activity: Not Currently    Partners: Male    Birth control/protection: None, Surgical    Comment: Last encounter 1 week ago  Other Topics Concern   Not on file  Social History Narrative   Patient lives at home alone and she is disabled.   Education 9th grade.   Right handed.   Caffeine one cup of coffee not daily.    Social Drivers of Health   Financial Resource Strain: Medium Risk (04/01/2023)   Overall Financial Resource Strain (CARDIA)    Difficulty of Paying Living Expenses: Somewhat hard  Food Insecurity: No Food Insecurity (04/01/2023)   Hunger Vital Sign    Worried About Running Out of Food in the Last Year: Never true    Ran Out of Food in the Last Year: Never true  Transportation Needs: No Transportation Needs (04/01/2023)   PRAPARE - Administrator, Civil Service (Medical): No    Lack of  Transportation (Non-Medical): No  Physical Activity: Sufficiently Active (04/01/2023)   Exercise Vital Sign    Days of Exercise per Week: 4 days    Minutes of Exercise per Session: 50 min  Stress: No Stress Concern Present (04/01/2023)   [employer] of Occupational Health - Occupational Stress Questionnaire    Feeling of Stress : Only a little  Social Connections: Socially Isolated (04/01/2023)   Social Connection and Isolation Panel [NHANES]    Frequency of Communication with Friends and Family: More than three times a week    Frequency of Social Gatherings with Friends and Family: Twice a week    Attends Religious Services: Never    Database administrator or Organizations: No    Attends Engineer, structural: Never    Marital Status: Divorced   Family History  Problem Relation Age of Onset   Suicidality Father    Diabetes Maternal Grandmother    Breast cancer Maternal Grandmother    Cervical cancer Paternal Grandmother    Healthy Daughter    Heart defect Son    Healthy Son    Allergies  Allergen Reactions   Anesthetics, Amide Anaphylaxis and Other (See Comments)    Heart stopped and stopped breathing   Benzocaine Anaphylaxis   Black Pepper [Piper] Anaphylaxis    Throat swelling   Mushroom Extract Complex (Do Not Select) Anaphylaxis    Mouth swelling   Trazodone And Nefazodone Anaphylaxis    Tongue swells.   Amoxicillin Hives and Diarrhea    Has patient had a PCN reaction causing immediate rash, facial/tongue/throat swelling, SOB or lightheadedness with hypotension: No Has patient had a PCN reaction causing severe rash involving mucus membranes or skin necrosis: Yes Has patient had a PCN reaction that required hospitalization: Yes Has patient had a PCN reaction occurring within the last 10 years: Yes If all of the above answers are "NO", then may proceed with Cephalosporin use.    Bee Venom Swelling   Benadryl [Diphenhydramine]     Heavy periods     Effexor [Venlafaxine] Hives   Lactose Intolerance (Gi)     Stomach pain   Nortriptyline Hives   Nsaids     Make colitis worse   Other Diarrhea    Red meat - stomach pains, bleeding    Current Outpatient Medications  Medication Sig Dispense Refill  methylPREDNISolone (MEDROL DOSEPAK) 4 MG TBPK tablet Take per packet instructions 21 tablet 0   acetaminophen (TYLENOL) 325 MG tablet Take 650 mg by mouth every 6 (six) hours as needed.     albuterol (VENTOLIN HFA) 108 (90 Base) MCG/ACT inhaler Inhale 1-2 puffs into the lungs every 4 (four) hours as needed for wheezing. 2 each 11   alendronate (FOSAMAX) 70 MG tablet TAKE 1 TABLET BY MOUTH ONCE A WEEK. TAKE WITH A FULL GLASS OF WATER ON AN EMPTY STOMACH. 12 tablet 0   EPINEPHRINE 0.3 mg/0.3 mL IJ SOAJ injection INJECT 0.3 MLS (0.3 MG TOTAL) INTO THE MUSCLE AS NEEDED FOR ANAPHYLAXIS. 2 each 2   hydroxychloroquine (PLAQUENIL) 200 MG tablet Take 200mg  by mouth Monday through Friday only. None on Saturday or Sunday. 60 tablet 0   ibuprofen (ADVIL) 200 MG tablet Take 200 mg by mouth as needed. 2-3 tablets as needed     levocetirizine (XYZAL) 5 MG tablet Take 1 tablet (5 mg total) by mouth every evening. 90 tablet 3   meclizine (ANTIVERT) 25 MG tablet Take 1 tablet (25 mg total) by mouth 3 (three) times daily as needed for dizziness (VERTIGO). 30 tablet 11   Melatonin 5 MG CAPS Take by mouth at bedtime.     meloxicam (MOBIC) 15 MG tablet Take 1 tablet (15 mg total) by mouth daily. 30 tablet 0   mometasone (NASONEX) 50 MCG/ACT nasal spray One spray in each nostril twice a day, use left hand for right nostril, and right hand for left nostril.  Please dispense one bottle. (Patient not taking: Reported on 04/15/2023) 1 g 6   ondansetron (ZOFRAN ODT) 4 MG disintegrating tablet Take 1 tablet (4 mg total) by mouth every 8 (eight) hours as needed for nausea or vomiting. 20 tablet 5   Rimegepant Sulfate (NURTEC) 75 MG TBDP Take 1 tablet (75 mg total) by mouth daily  as needed. 8 tablet 5   No current facility-administered medications for this visit.   No results found.  Review of Systems:   A ROS was performed including pertinent positives and negatives as documented in the HPI.  Physical Exam :   Constitutional: NAD and appears stated age Neurological: Alert and oriented Psych: Appropriate affect and cooperative Last menstrual period 07/20/2018.   Comprehensive Musculoskeletal Exam:    Tenderness palpation of the left side of the cervical spine with positive Spurling's.  Full cervical range of motion with discomfort noted in all directions.  Tenderness over the anterior left shoulder.  Active range of motion to 150 degrees forward flexion, 30 degrees external rotation, and internal rotation L5 of the left shoulder.  Contralateral shoulder forward flexes to 160 and internally rotates to T10.  Grip strength 5/5 bilaterally.  Imaging:   Xray (cervical spine 4 views): Loss of lordotic curvature noted throughout.  Decreased intervertebral disc spacing between C6-C7 and C7-T1.  Retrolisthesis of C5 and C7.  Xray (left shoulder 3 views): Negative for bony abnormality  I personally reviewed and interpreted the radiographs.   Assessment:   52 y.o. female with 22-month history of pain around the right shoulder that travels down the right arm to the pinky finger.  On exam she does have significant tenderness and pain in the cervical spine which I believe is likely the cause of her symptoms.  This does appear to follow a C8 distribution as it involves the pinky finger and states that the pain travels up underneath the axilla.  No significant abnormality on x-ray  of the shoulder.  Will plan to proceed with treatment for cervical radiculopathy and monitor her response to ensure there is no underlying shoulder pathology.  She is a good candidate for physical therapy so will refer today.  She does leave town for a few weeks so we will likely not be able to start  until January.  She can start on a steroid taper today I will plan to see her back likely in February after beginning PT.  Plan :    -Start Medrol Dosepak -Referral to physical therapy for cervical radiculopathy     I personally saw and evaluated the patient, and participated in the management and treatment plan.  Hazle Nordmann, PA-C Orthopedics

## 2023-05-09 ENCOUNTER — Other Ambulatory Visit: Payer: Medicaid Other

## 2023-05-13 ENCOUNTER — Ambulatory Visit (HOSPITAL_BASED_OUTPATIENT_CLINIC_OR_DEPARTMENT_OTHER): Payer: Medicaid Other | Admitting: Radiology

## 2023-06-14 NOTE — Progress Notes (Deleted)
 Office Visit Note  Patient: Danielle Holden             Date of Birth: 1971-01-16           MRN: [id number]             PCP: Hilbert Bible, FNP Referring: Tollie Eth, NP Visit Date: 06/28/2023 Occupation: @GUAROCC @  Subjective:  No chief complaint on file.   History of Present Illness: Danielle Holden is a 53 y.o. female ***     Activities of Daily Living:  Patient reports morning stiffness for *** {minute/hour:19697}.   Patient {ACTIONS;DENIES/REPORTS:21021675::"Denies"} nocturnal pain.  Difficulty dressing/grooming: {ACTIONS;DENIES/REPORTS:21021675::"Denies"} Difficulty climbing stairs: {ACTIONS;DENIES/REPORTS:21021675::"Denies"} Difficulty getting out of chair: {ACTIONS;DENIES/REPORTS:21021675::"Denies"} Difficulty using hands for taps, buttons, cutlery, and/or writing: {ACTIONS;DENIES/REPORTS:21021675::"Denies"}  No Rheumatology ROS completed.   PMFS History:  Patient Active Problem List   Diagnosis Date Noted   Patellofemoral pain syndrome of right knee 12/12/2021   DDD (degenerative disc disease), cervical 09/11/2021   Scoliosis 09/11/2021   Other systemic lupus erythematosus with other organ involvement (HCC) 09/01/2021   Asthma due to seasonal allergies 09/01/2021   Insomnia 09/01/2021   Myofascial pain 05/29/2018   History of IBS 05/29/2018   History of gastroesophageal reflux (GERD) 05/29/2018   History of TIA (transient ischemic attack) 05/29/2018   History of rheumatoid arthritis 10/25/2016   Seasonal allergies 10/25/2016   Ventral hernia 10/25/2016   Vertigo 10/25/2016   Fibromyalgia 03/05/2016   Other specified abnormal immunological findings in serum 03/05/2016   Raynaud's disease without gangrene 03/05/2016   Positive ANA (antinuclear antibody) 03/05/2016   Generalized anxiety disorder 07/26/2014   Hx of migraines 01/27/2014   Right knee pain 12/22/2013   Iron deficiency anemia 06/30/2013   Pain in joints 06/04/2013   Herpes  zoster 02/22/2012    Past Medical History:  Diagnosis Date   Abnormal uterine bleeding    Abnormal uterine bleeding (AUB) 07/13/2013   Anxiety    Arthritis    hands, knees, shoulder, back    Asthma    Asthma in adult, mild persistent, uncomplicated 05/29/2018   Chest pain 06/27/2013   Colitis    History of   Complication of anesthesia 01/1992   During tubal ligation heart stopped and she stopped breathing   Depression    Environmental and seasonal allergies    Fatigue    Gallstones    GERD (gastroesophageal reflux disease)    Heart rate slow    per watch   History of kidney stones    History of kidney stones 05/29/2018   History of shingles    Hypoglycemia    IBS (irritable bowel syndrome)    Iron deficiency anemia    receive iron infusion   Lactose intolerance    Lumbar herniated disc    per patient   Lupus    Migraine    Migraine without aura and without status migrainosus, not intractable 07/26/2014   Migraine, unspecified, without mention of intractable migraine without mention of status migrainosus 09/28/2013   Numbness and tingling of both upper extremities    first thing in the morning when she wakes up   Numbness and tingling of lower extremity    first thing in the morning when she wakes up   Osteoporosis    per patient, DEXA in 07/2019   RA (rheumatoid arthritis) (HCC)    Ventral hernia    Vertigo     Family History  Problem Relation Age of Onset   Suicidality  Father    Diabetes Maternal Grandmother    Breast cancer Maternal Grandmother    Cervical cancer Paternal Grandmother    Healthy Daughter    Heart defect Son    Healthy Son    Past Surgical History:  Procedure Laterality Date   APPENDECTOMY     CESAREAN SECTION     CHOLECYSTECTOMY [name] 08/07/2017   Procedure: LAPAROSCOPIC CHOLECYSTECTOMY, PRIMARY REPAIR OF UMBILICAL HERNIA;  Surgeon: Andria Meuse, MD;  Location: [facility];  Service: General;  Laterality: [name];   COLONOSCOPY     IUD REMOVAL [name]  11/12/2014   TUBAL LIGATION     UMBILICAL HERNIA REPAIR     Social History   Social History Narrative   Patient lives at home alone and she is disabled.   Education 9th grade.   Right handed.   Caffeine one cup of coffee not daily.    Immunization History  Administered Date(s) Administered   Influenza, Seasonal, Injecte, Preservative Fre 03/29/2023   Influenza,inj,Quad PF,6+ Mos 03/06/2018, 02/11/2019   Influenza-Unspecified 03/06/2018, 02/11/2019, 01/13/2020, 01/13/2020, 02/22/2021   PFIZER(Purple Top)SARS-COV-2 Vaccination 08/01/2019, 08/22/2019, 03/04/2020   Tetanus 01/13/2020     Objective: Vital Signs: LMP 07/20/2018 (Exact Date)    Physical Exam   Musculoskeletal Exam: ***  CDAI Exam: CDAI Score: -- Patient Global: --; Provider Global: -- Swollen: --; Tender: -- Joint Exam 06/28/2023   No joint exam has been documented for this visit   There is currently no information documented on the homunculus. Go to the Rheumatology activity and complete the homunculus joint exam.  Investigation: No additional findings.  Imaging: No results found.  Recent Labs: Lab Results  Component Value Date   WBC 6.7 01/21/2023   HGB 13.9 01/21/2023   PLT 222 01/21/2023   NA 141 01/21/2023   K 3.8 01/21/2023   CL 107 01/21/2023   CO2 24 01/21/2023   GLUCOSE 80 01/21/2023   BUN 10 01/21/2023   CREATININE 0.68 01/21/2023   BILITOT 0.4 01/21/2023   ALKPHOS 125 (H) 07/26/2021   AST 14 01/21/2023   ALT 8 01/21/2023   PROT 6.9 01/21/2023   ALBUMIN 5.0 (H) 07/26/2021   CALCIUM 8.8 01/21/2023   GFRAA 114 04/11/2020    Speciality Comments: PLQ eye exam normal 06/26/2022 Dr. Dione Booze f/u 1 year  Fosamax12/21  Procedures:  No procedures performed Allergies: Anesthetics, amide; Benzocaine; Black pepper [piper]; Mushroom extract complex (do not select); Trazodone and nefazodone; Amoxicillin; Bee venom; Benadryl [diphenhydramine]; Effexor [venlafaxine]; Lactose intolerance (gi);  Nortriptyline; Nsaids; and Other   Assessment / Plan:     Visit Diagnoses: No diagnosis found.  Orders: No orders of the defined types were placed in this encounter.  No orders of the defined types were placed in this encounter.   Face-to-face time spent with patient was *** minutes. Greater than 50% of time was spent in counseling and coordination of care.  Follow-Up Instructions: No follow-ups on file.   Ellen Henri, CMA  Note - This record has been created using Animal nutritionist.  Chart creation errors have been sought, but may not always  have been located. Such creation errors do not reflect on  the standard of medical care.

## 2023-06-28 ENCOUNTER — Ambulatory Visit: Payer: Medicaid Other | Admitting: Rheumatology

## 2023-06-28 DIAGNOSIS — Z5181 Encounter for therapeutic drug level monitoring: Secondary | ICD-10-CM

## 2023-06-28 DIAGNOSIS — Z87442 Personal history of urinary calculi: Secondary | ICD-10-CM

## 2023-06-28 DIAGNOSIS — Z8673 Personal history of transient ischemic attack (TIA), and cerebral infarction without residual deficits: Secondary | ICD-10-CM

## 2023-06-28 DIAGNOSIS — M810 Age-related osteoporosis without current pathological fracture: Secondary | ICD-10-CM

## 2023-06-28 DIAGNOSIS — R21 Rash and other nonspecific skin eruption: Secondary | ICD-10-CM

## 2023-06-28 DIAGNOSIS — R5383 Other fatigue: Secondary | ICD-10-CM

## 2023-06-28 DIAGNOSIS — E559 Vitamin D deficiency, unspecified: Secondary | ICD-10-CM

## 2023-06-28 DIAGNOSIS — Z79899 Other long term (current) drug therapy: Secondary | ICD-10-CM

## 2023-06-28 DIAGNOSIS — Z8669 Personal history of other diseases of the nervous system and sense organs: Secondary | ICD-10-CM

## 2023-06-28 DIAGNOSIS — M7918 Myalgia, other site: Secondary | ICD-10-CM

## 2023-06-28 DIAGNOSIS — F32A Depression, unspecified: Secondary | ICD-10-CM

## 2023-06-28 DIAGNOSIS — M3219 Other organ or system involvement in systemic lupus erythematosus: Secondary | ICD-10-CM

## 2023-06-28 DIAGNOSIS — Z8709 Personal history of other diseases of the respiratory system: Secondary | ICD-10-CM

## 2023-06-28 DIAGNOSIS — Z8719 Personal history of other diseases of the digestive system: Secondary | ICD-10-CM

## 2023-06-28 DIAGNOSIS — M7061 Trochanteric bursitis, right hip: Secondary | ICD-10-CM

## 2023-06-28 DIAGNOSIS — M722 Plantar fascial fibromatosis: Secondary | ICD-10-CM

## 2023-06-28 DIAGNOSIS — M47816 Spondylosis without myelopathy or radiculopathy, lumbar region: Secondary | ICD-10-CM

## 2023-07-17 ENCOUNTER — Ambulatory Visit: Payer: Medicaid Other | Admitting: Dermatology

## 2023-10-14 ENCOUNTER — Encounter (HOSPITAL_BASED_OUTPATIENT_CLINIC_OR_DEPARTMENT_OTHER): Payer: Medicaid Other | Admitting: Family Medicine

## 2024-03-16 ENCOUNTER — Encounter: Payer: Self-pay | Admitting: Radiology

## 2024-04-19 ENCOUNTER — Encounter (HOSPITAL_BASED_OUTPATIENT_CLINIC_OR_DEPARTMENT_OTHER): Payer: Self-pay | Admitting: Family Medicine
